# Patient Record
Sex: Female | Born: 1980 | Hispanic: Yes | Marital: Married | Smoking: Never smoker
Health system: Southern US, Community
[De-identification: ages and names within clinical notes are randomized; demographics above are authoritative.]

## PROBLEM LIST (undated history)

## (undated) ENCOUNTER — Inpatient Hospital Stay (HOSPITAL_COMMUNITY): Payer: Self-pay

## (undated) DIAGNOSIS — T40601A Poisoning by unspecified narcotics, accidental (unintentional), initial encounter: Secondary | ICD-10-CM

## (undated) DIAGNOSIS — Z9151 Personal history of suicidal behavior: Secondary | ICD-10-CM

## (undated) DIAGNOSIS — T50902A Poisoning by unspecified drugs, medicaments and biological substances, intentional self-harm, initial encounter: Secondary | ICD-10-CM

## (undated) DIAGNOSIS — M545 Low back pain, unspecified: Secondary | ICD-10-CM

## (undated) DIAGNOSIS — Z915 Personal history of self-harm: Secondary | ICD-10-CM

## (undated) DIAGNOSIS — R87619 Unspecified abnormal cytological findings in specimens from cervix uteri: Secondary | ICD-10-CM

## (undated) DIAGNOSIS — F4310 Post-traumatic stress disorder, unspecified: Secondary | ICD-10-CM

## (undated) DIAGNOSIS — T391X1A Poisoning by 4-Aminophenol derivatives, accidental (unintentional), initial encounter: Secondary | ICD-10-CM

## (undated) DIAGNOSIS — O021 Missed abortion: Secondary | ICD-10-CM

## (undated) DIAGNOSIS — IMO0002 Reserved for concepts with insufficient information to code with codable children: Secondary | ICD-10-CM

## (undated) HISTORY — DX: Low back pain: M54.5

## (undated) HISTORY — DX: Poisoning by unspecified narcotics, accidental (unintentional), initial encounter: T40.601A

## (undated) HISTORY — DX: Post-traumatic stress disorder, unspecified: F43.10

## (undated) HISTORY — PX: DILATION AND CURETTAGE OF UTERUS: SHX78

## (undated) HISTORY — DX: Reserved for concepts with insufficient information to code with codable children: IMO0002

## (undated) HISTORY — DX: Poisoning by unspecified drugs, medicaments and biological substances, intentional self-harm, initial encounter: T50.902A

## (undated) HISTORY — DX: Low back pain, unspecified: M54.50

## (undated) HISTORY — DX: Poisoning by 4-aminophenol derivatives, accidental (unintentional), initial encounter: T39.1X1A

---
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
## 2010-07-10 DIAGNOSIS — IMO0002 Reserved for concepts with insufficient information to code with codable children: Secondary | ICD-10-CM

## 2010-07-10 HISTORY — DX: Reserved for concepts with insufficient information to code with codable children: IMO0002

## 2010-09-19 ENCOUNTER — Inpatient Hospital Stay (INDEPENDENT_AMBULATORY_CARE_PROVIDER_SITE_OTHER)
Admission: RE | Admit: 2010-09-19 | Discharge: 2010-09-19 | Disposition: A | Discharge status: Home / Self Care | Payer: Self-pay | Source: Ambulatory Visit | Attending: Emergency Medicine | Admitting: Emergency Medicine

## 2010-09-19 ENCOUNTER — Encounter: Payer: Self-pay | Admitting: Emergency Medicine

## 2010-09-19 DIAGNOSIS — N390 Urinary tract infection, site not specified: Secondary | ICD-10-CM

## 2010-09-19 LAB — CONVERTED CEMR LAB
Blood in Urine, dipstick: NEGATIVE
Glucose, Urine, Semiquant: NEGATIVE
Ketones, urine, test strip: NEGATIVE
Specific Gravity, Urine: 1.015
WBC Urine, dipstick: NEGATIVE
pH: 5.5

## 2010-09-22 ENCOUNTER — Telehealth (INDEPENDENT_AMBULATORY_CARE_PROVIDER_SITE_OTHER): Payer: Self-pay | Admitting: *Deleted

## 2010-12-04 ENCOUNTER — Other Ambulatory Visit: Payer: Self-pay | Admitting: Family Medicine

## 2010-12-04 ENCOUNTER — Encounter: Payer: Self-pay | Admitting: Family Medicine

## 2010-12-04 ENCOUNTER — Inpatient Hospital Stay (INDEPENDENT_AMBULATORY_CARE_PROVIDER_SITE_OTHER)
Admission: RE | Admit: 2010-12-04 | Discharge: 2010-12-04 | Disposition: A | Discharge status: Home / Self Care | Payer: Self-pay | Source: Ambulatory Visit | Attending: Family Medicine | Admitting: Family Medicine

## 2010-12-04 ENCOUNTER — Ambulatory Visit
Admission: RE | Admit: 2010-12-04 | Discharge: 2010-12-04 | Disposition: A | Discharge status: Home / Self Care | Payer: Self-pay | Source: Ambulatory Visit | Attending: Family Medicine | Admitting: Family Medicine

## 2010-12-04 DIAGNOSIS — R05 Cough: Secondary | ICD-10-CM

## 2010-12-04 DIAGNOSIS — R059 Cough, unspecified: Secondary | ICD-10-CM

## 2010-12-04 DIAGNOSIS — J069 Acute upper respiratory infection, unspecified: Secondary | ICD-10-CM

## 2010-12-04 DIAGNOSIS — M940 Chondrocostal junction syndrome [Tietze]: Secondary | ICD-10-CM

## 2011-01-18 ENCOUNTER — Emergency Department (INDEPENDENT_AMBULATORY_CARE_PROVIDER_SITE_OTHER)
Admission: EM | Admit: 2011-01-18 | Discharge: 2011-01-18 | Disposition: A | Discharge status: Home / Self Care | Payer: Self-pay | Source: Home / Self Care | Attending: Family Medicine | Admitting: Family Medicine

## 2011-01-18 ENCOUNTER — Encounter (HOSPITAL_COMMUNITY): Payer: Self-pay | Admitting: Family Medicine

## 2011-01-18 DIAGNOSIS — N949 Unspecified condition associated with female genital organs and menstrual cycle: Secondary | ICD-10-CM

## 2011-01-18 DIAGNOSIS — R102 Pelvic and perineal pain: Secondary | ICD-10-CM

## 2011-01-18 LAB — POCT URINALYSIS DIP (DEVICE)
Glucose, UA: NEGATIVE mg/dL
Nitrite: NEGATIVE
Urobilinogen, UA: 0.2 mg/dL (ref 0.0–1.0)
pH: 5.5 (ref 5.0–8.0)

## 2011-01-18 LAB — WET PREP, GENITAL: WBC, Wet Prep HPF POC: NONE SEEN

## 2011-01-18 NOTE — ED Notes (Signed)
C/o pain in her vagina; information per interpreter
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
## 2011-01-19 NOTE — ED Notes (Signed)
Pieter Partridge called ucc with concerns for patient's understanding of discharge instructions and follow up.  Called phone number to speak with patient at [phone].  No anwer.  [email or web address], rn

## 2011-01-21 ENCOUNTER — Inpatient Hospital Stay (HOSPITAL_COMMUNITY)
Admission: AD | Admit: 2011-01-21 | Discharge: 2011-01-21 | Disposition: A | Discharge status: Home / Self Care | Payer: No Typology Code available for payment source | Source: Ambulatory Visit | Attending: Obstetrics & Gynecology | Admitting: Obstetrics & Gynecology

## 2011-01-21 ENCOUNTER — Encounter (HOSPITAL_COMMUNITY): Payer: Self-pay | Admitting: *Deleted

## 2011-01-21 ENCOUNTER — Inpatient Hospital Stay (HOSPITAL_COMMUNITY): Payer: No Typology Code available for payment source

## 2011-01-21 ENCOUNTER — Telehealth (HOSPITAL_COMMUNITY): Payer: Self-pay | Admitting: *Deleted

## 2011-01-21 DIAGNOSIS — B9689 Other specified bacterial agents as the cause of diseases classified elsewhere: Secondary | ICD-10-CM | POA: Insufficient documentation

## 2011-01-21 DIAGNOSIS — A499 Bacterial infection, unspecified: Secondary | ICD-10-CM | POA: Insufficient documentation

## 2011-01-21 DIAGNOSIS — N760 Acute vaginitis: Secondary | ICD-10-CM | POA: Insufficient documentation

## 2011-01-21 DIAGNOSIS — R102 Pelvic and perineal pain unspecified side: Secondary | ICD-10-CM | POA: Diagnosis present

## 2011-01-21 DIAGNOSIS — R109 Unspecified abdominal pain: Secondary | ICD-10-CM | POA: Insufficient documentation

## 2011-01-21 DIAGNOSIS — N949 Unspecified condition associated with female genital organs and menstrual cycle: Secondary | ICD-10-CM

## 2011-01-21 HISTORY — DX: Reserved for concepts with insufficient information to code with codable children: IMO0002

## 2011-01-21 HISTORY — DX: Unspecified abnormal cytological findings in specimens from cervix uteri: R87.619

## 2011-01-21 LAB — URINALYSIS, ROUTINE W REFLEX MICROSCOPIC
Leukocytes, UA: NEGATIVE
Nitrite: NEGATIVE
Specific Gravity, Urine: >1.03 — ABNORMAL HIGH (ref 1.005–1.030)
Urobilinogen, UA: 0.2 mg/dL (ref 0.0–1.0)

## 2011-01-21 LAB — CBC
Platelets: 270 10*3/uL (ref 150–400)
RBC: 3.98 MIL/uL (ref 3.87–5.11)
WBC: 7.5 10*3/uL (ref 4.0–10.5)

## 2011-01-21 MED ORDER — DOXYCYCLINE HYCLATE 50 MG PO CAPS: 100.0000 mg | ORAL_CAPSULE | Freq: Two times a day (BID) | ORAL | Status: AC

## 2011-01-21 MED ORDER — CEFTRIAXONE SODIUM 250 MG IJ SOLR
250.0000 mg | Freq: Once | INTRAMUSCULAR | Status: AC
  Administered 2011-01-21: 250 mg via INTRAMUSCULAR
  Filled 2011-01-21: qty 250

## 2011-01-21 MED ORDER — CLINDAMYCIN HCL 300 MG PO CAPS: 300.0000 mg | ORAL_CAPSULE | Freq: Two times a day (BID) | ORAL | Status: AC

## 2011-01-21 NOTE — ED Notes (Signed)
No problems with injection. Pt dc'd at this time
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
## 2011-01-21 NOTE — Progress Notes (Signed)
Waiting for interpreter, pt advocate with her, limited skills to interpret,  Vaginal pain, no bleeding, LMP 10/28, seen at ED  11/9/ no meds, no cause of pain

## 2011-01-24 NOTE — ED Provider Notes (Signed)
Agree with above note.  [name] Kessen H. 01/24/2011 12:42 PM  

## 2011-02-11 NOTE — Telephone Encounter (Signed)
  Phone Note Outgoing Call   Call placed by: Lajean Saver RN,  September 22, 2010 1:59 PM Call placed to: Patient Summary of Call: message left informing of f/u call and to call facility with questions/concerns 09/24/2010- spoke to the pts case worker, Fleet Contras, she states that the pt called her and states that she is itching and has a rash. she is unsure if the rash is on her skin or if it is vaginal itching. Per Dr Orson Aloe I advised her if it is a skin reaction to stop the abt and take OTC Benadryl or Zyrtec. If it is vaginal itching continue abt and take Diflucan, rx called to [facility] [address] in [place].Reminded them to f/u with the [facility] clinic soon. pts case worker agrees. Initial call taken by: Clemens Catholic LPN,  September 24, 2010 6:59 PM

## 2011-02-11 NOTE — Progress Notes (Signed)
Summary: BURNING PAIN IN LEFT ARM/TRAVELING TO CHEST (rm 5)   Vital Signs:  Patient Profile:   30 Years Old Female CC:      burning in left shoulder and chest x 3 days, sore throat and coughx 2-3 weeks Height:     61 inches Weight:      120 pounds O2 Sat:      99 % O2 treatment:    Room Air Temp:     98.3 degrees F oral Pulse rate:   69 / minute Resp:     14 per minute BP sitting:   101 / 62  (left arm) Cuff size:   regular  Vitals Entered By: Lajean Saver RN (December 04, 2010 9:25 AM)                  Updated Prior Medication List: CIPROFLOXACIN HCL 500 MG TABS (CIPROFLOXACIN HCL) two times a day  Current Allergies: No known allergies History of Present Illness Chief Complaint: burning in left shoulder and chest x 3 days, sore throat and coughx 2-3 weeks History of Present Illness:  Subjective:  Patient presents with case worker/interpreter.  She complains of a cough for over a week with sore throat.  She has now developed pain in her anterior chest that radiates to her left shoulder, and has had a fever. She is presently taking Cipro for a UTI  REVIEW OF SYSTEMS Constitutional Symptoms       Complains of fever and chills.     Denies night sweats, weight loss, weight gain, and fatigue.  Eyes       Denies change in vision, eye pain, eye discharge, glasses, contact lenses, and eye surgery. Ear/Nose/Throat/Mouth       Complains of sore throat.      Denies hearing loss/aids, change in hearing, ear pain, ear discharge, dizziness, frequent runny nose, frequent nose bleeds, sinus problems, hoarseness, and tooth pain or bleeding.  Respiratory       Complains of dry cough.      Denies productive cough, wheezing, shortness of breath, asthma, bronchitis, and emphysema/COPD.  Cardiovascular       Complains of chest pain.      Denies murmurs and tires easily with exhertion.      Comments: burning   Gastrointestinal       Denies stomach pain, nausea/vomiting, diarrhea,  constipation, blood in bowel movements, and indigestion. Genitourniary       Denies painful urination, kidney stones, and loss of urinary control. Neurological       Denies paralysis, seizures, and fainting/blackouts. Musculoskeletal       Denies muscle pain, joint pain, joint stiffness, decreased range of motion, redness, swelling, muscle weakness, and gout.  Skin       Denies bruising, unusual mles/lumps or sores, and hair/skin or nail changes.  Psych       Denies mood changes, temper/anger issues, anxiety/stress, speech problems, depression, and sleep problems. Other Comments: Current complaints gathered via case worker, patients does not speak any [place]. Patient c/o cough and sore throat x 2-3 weeks. C/o some chills and ?fever. She also c/o burning pain in left shoulder that has moved in to chest x 3 days   Past History:  Past Medical History: UNKNOWN recent UTIs  Past Surgical History: Reviewed history from 09/19/2010 and no changes required. Denies surgical history  Family History: none/unknown  Social History: Never Smoked Alcohol use-no Drug use-no No English   Objective:  Appearance:  Patient appears healthy,  stated age, and in no acute distress  Eyes:  Pupils are equal, round, and reactive to light and accomodation.  Extraocular movement is intact.  Conjunctivae are not inflamed.  Ears:  Canals normal.  Tympanic membranes normal.   Nose:  Mildly congested turbinates.  No sinus tenderness  Pharynx:  Normal  Neck:  Supple.  Tender shotty osterior nodes are palpated bilaterally.   Lungs:  Clear to auscultation.  Breath sounds are equal.  Chest:  Distinct tenderness over sternum.  Palpation there recreates her chest pain Heart:  Regular rate and rhythm without murmurs, rubs, or gallops.  Abdomen:  Nontender without masses or hepatosplenomegaly.  Bowel sounds are present.  No CVA or flank tenderness.  Rapid strep test negative Chest X-ray negative CBC:  WBC 6.4 ;  LY 36.9, MO 4.2, GR 58.9; Hgb 12.2  Assessment New Problems: UPPER RESPIRATORY INFECTION, ACUTE (ICD-465.9) COSTOCHONDRITIS, ACUTE (ICD-733.6) COUGH (ICD-786.2)  VIRAL URI WITH COSTOCHONDRITIS  Plan New Medications/Changes: BENZONATATE 200 MG CAPS (BENZONATATE) One by mouth hs as needed cough  #12 x 0, 12/04/2010, Donna Christen MD NAPROXEN 375 MG TABS (NAPROXEN) 1 by mouth q12hr pc  #20 x 0, 12/04/2010, Donna Christen MD  New Orders: T-Chest x-ray, 2 views [71020] Est. Patient Level II [09604] Planning Comments:   Discussed with patient and case worker; reassurance.  Given a [facility] patient information and instruction sheets in Spanish on topics Costochondritis and Viral URI. Begin naproxen, expectorant/decongestant, cough suppressant at bedtime.  Increase fluid intake Followup with PCP if not improving 7 to 10 days   The patient and/or caregiver has been counseled thoroughly with regard to medications prescribed including dosage, schedule, interactions, rationale for use, and possible side effects and they verbalize understanding.  Diagnoses and expected course of recovery discussed and will return if not improved as expected or if the condition worsens. Patient and/or caregiver verbalized understanding.  Prescriptions: BENZONATATE 200 MG CAPS (BENZONATATE) One by mouth hs as needed cough  #12 x 0   Entered and Authorized by:   Donna Christen MD   Signed by:   Donna Christen MD on 12/04/2010   Method used:   Print then Give to Patient   RxID:   [id number] NAPROXEN 375 MG TABS (NAPROXEN) 1 by mouth q12hr pc  #20 x 0   Entered and Authorized by:   Donna Christen MD   Signed by:   Donna Christen MD on 12/04/2010   Method used:   Print then Give to Patient   RxID:   [id number]   Patient Instructions: 1)  Take Mucinex D (guaifenesin with decongestant) twice daily for congestion. 2)  Increase fluid intake, rest. 3)  Followup with family doctor if not improving 7 to 10 days.     Orders Added: 1)  T-Chest x-ray, 2 views [71020] 2)  Est. Patient Level II [28413]    Laboratory Results  Date/Time Received: December 04, 2010 9:30 AM  Date/Time Reported: December 04, 2010 9:30 AM   Other Tests  Rapid Strep: negative  Kit Test Internal QC: Negative   (Normal Range: Negative)
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
## 2011-02-11 NOTE — Letter (Signed)
Summary: Out of [facility]  [address]   [place], [place] [address]   Phone: [phone]  Fax: [phone]    December 04, 2010   Student:  Delmar Landau    To Whom It May Concern:   For Medical reasons, please excuse the above named student from school 12/03/10 through 12/05/10.   If you need additional information, please feel free to contact our office.   Sincerely,    Donna Christen MD    ****This is a legal document and cannot be tampered with.  Schools are authorized to verify all information and to do so accordingly.

## 2011-02-20 ENCOUNTER — Encounter: Payer: Self-pay | Admitting: Advanced Practice Midwife

## 2011-02-20 ENCOUNTER — Ambulatory Visit (INDEPENDENT_AMBULATORY_CARE_PROVIDER_SITE_OTHER): Payer: Self-pay | Admitting: Advanced Practice Midwife

## 2011-02-20 VITALS — BP 91/54 | HR 72 | Temp 96.7°F | Ht <= 58 in | Wt 118.9 lb

## 2011-02-20 DIAGNOSIS — G4700 Insomnia, unspecified: Secondary | ICD-10-CM

## 2011-02-20 DIAGNOSIS — F4310 Post-traumatic stress disorder, unspecified: Secondary | ICD-10-CM

## 2011-02-20 DIAGNOSIS — N760 Acute vaginitis: Secondary | ICD-10-CM

## 2011-02-20 HISTORY — DX: Post-traumatic stress disorder, unspecified: F43.10

## 2011-02-20 MED ORDER — ZOLPIDEM TARTRATE 10 MG PO TABS: 10.0000 mg | ORAL_TABLET | Freq: Every evening | ORAL | Status: AC | PRN
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
## 2011-02-20 NOTE — Patient Instructions (Signed)
  Trastorno por estrs post traumtico (Post-Traumatic Stress Disorder) Si usted ha recibido el diagnstico de trastorno por estrs post traumtico (TEPT), probablemente ha experimentado un acontecimiento traumtico en su vida. Estos hechos generalmente estn fuera del rango de las experiencias humanas normales e impactan de manera negativa en toda persona normal.  CAUSAS El diagnstico se realiza en personas que han vivido o visto situaciones de riesgo como:  Accidentes automovilsticos.   Dolores Frame.   Desastres naturales.   Violaciones.   Violencia familiar.   Todo hecho en el que haya sido amenazada su vida.  Este trastorno es una verdadera enfermedad. Puede ocurrir a Emergency planning/management officer de toda edad. Tambin le sucede a los nios. Un mdico o un profesional de Radiographer, therapeutic mental con experiencia en este trastorno puede ayudarlo. SNTOMAS Puede ser que no presente todos los sntomas.  Sueos estresantes.   Reiteracin del hecho: sentir que el hecho atemorizante va a ocurrir nuevamente.   Evitar actividades, lugares y Theme park manager.   Esfuerzos para evitar pensamientos y sentimientos asociados al mismo.   Tener pensamientos atemorizantes que no pueden controlarse.   Sentirse nervioso, con aumento del [place] y vigilancia.   Problemas para dormir.   Sentirse solo, sensacin de [employer] dems son indiferentes.   Explosiones de ira.   Sentirse preocupado, culpable o triste.   Pensamientos relacionados con hacerse dao a s mismo o a los dems.  Este trastorno puede comenzar enseguida de ocurrido una situacin de riesgo, o meses o aos despus. Muchos veteranos de guerra lo sufren. El consumo de alcohol o drogas no ayudar a Therapist, music, y puede empeorarlo an ms.  TRATAMIENTO Este trastorno puede tratarse. El [address] en psicoterapia, medicamentos o [place]. Un mdico o un profesional de Radiographer, therapeutic mental con experiencia en este  trastorno puede ayudarlo. El diagnstico y tratamiento tempranos son lo ms indicado y Fish farm manager una mejora rpida. Busque ayuda si un ser querido tiene pensamientos relacionados con hacerse dao a s mismo. Comunquese con el servicio de urgencias de su localidad si necesita ayuda inmediatamente. Document Released: 12/05/2004 Document Revised: 11/07/2010 [facility] Patient Information 2012 [place], [place].Vulvovaginitis Candidisica (Candidal Vulvovaginitis) La vulvovaginitis candidisica es una infeccin de la vagina y la vulva. La vulva es la piel que rodea la abertura de la vagina. Puede causar picazn y [place] de y alrededor de la vagina.  CUIDADOS EN EL HOGAR  Slo tome medicamentos como lo indique su mdico.   No mantenga relaciones sexuales hasta que la infeccin haya curado o segn le indique el mdico.   Practique sexo seguro.   Informe a su compaero sexual acerca de su infeccin.   No tome duchas vaginales ni use tampones.   Use ropa interior de algodn. No utilice pantalones ni pantimedias ajustados.   Coma yogur. Esto puede ayudar a tratar y prevenir las infecciones por cndida.  SOLICITE AYUDA DE INMEDIATO SI:   Tiene fiebre.   Los problemas empeoran durante el tratamiento, o si no mejora luego de [address].   Tiene malestar, irritacin, o picazn en la zona de la vagina o la vulva.   Siente dolor en al [employer]   Comienza a sentir dolor abdominal.  ASEGRESE DE QUE:  Comprende estas instrucciones.   Controlar su enfermedad.   Solicitar ayuda de inmediato si no mejora o empeora.  Document Released: 03/30/2010 Document Revised: 11/07/2010 [facility] Patient Information 2012 [place], [place].

## 2011-02-21 LAB — WET PREP, GENITAL
Trich, Wet Prep: NONE SEEN
Yeast Wet Prep HPF POC: NONE SEEN

## 2011-02-21 NOTE — Progress Notes (Signed)
Pt left clinic without Rx for Ambien.  Rx called in to [facility].

## 2011-04-23 ENCOUNTER — Other Ambulatory Visit: Payer: Self-pay | Admitting: Advanced Practice Midwife

## 2011-09-04 ENCOUNTER — Ambulatory Visit: Payer: No Typology Code available for payment source | Admitting: Advanced Practice Midwife

## 2011-09-18 ENCOUNTER — Encounter (HOSPITAL_COMMUNITY): Payer: Self-pay | Admitting: Internal Medicine

## 2011-09-18 ENCOUNTER — Other Ambulatory Visit: Payer: Self-pay

## 2011-09-18 ENCOUNTER — Inpatient Hospital Stay (HOSPITAL_COMMUNITY): Payer: Medicaid Other

## 2011-09-18 ENCOUNTER — Inpatient Hospital Stay (HOSPITAL_COMMUNITY)
Admission: EM | Admit: 2011-09-18 | Discharge: 2011-09-20 | DRG: 781 | Disposition: A | Discharge status: Home / Self Care | Payer: Medicaid Other | Attending: Internal Medicine | Admitting: Internal Medicine

## 2011-09-18 DIAGNOSIS — O99891 Other specified diseases and conditions complicating pregnancy: Principal | ICD-10-CM | POA: Diagnosis present

## 2011-09-18 DIAGNOSIS — F17200 Nicotine dependence, unspecified, uncomplicated: Secondary | ICD-10-CM | POA: Diagnosis present

## 2011-09-18 DIAGNOSIS — Z331 Pregnant state, incidental: Secondary | ICD-10-CM

## 2011-09-18 DIAGNOSIS — T50902A Poisoning by unspecified drugs, medicaments and biological substances, intentional self-harm, initial encounter: Secondary | ICD-10-CM | POA: Diagnosis present

## 2011-09-18 DIAGNOSIS — R5383 Other fatigue: Secondary | ICD-10-CM | POA: Diagnosis present

## 2011-09-18 DIAGNOSIS — T1491XA Suicide attempt, initial encounter: Secondary | ICD-10-CM

## 2011-09-18 DIAGNOSIS — E876 Hypokalemia: Secondary | ICD-10-CM | POA: Diagnosis present

## 2011-09-18 DIAGNOSIS — T40601A Poisoning by unspecified narcotics, accidental (unintentional), initial encounter: Secondary | ICD-10-CM

## 2011-09-18 DIAGNOSIS — T50901A Poisoning by unspecified drugs, medicaments and biological substances, accidental (unintentional), initial encounter: Secondary | ICD-10-CM

## 2011-09-18 DIAGNOSIS — R5381 Other malaise: Secondary | ICD-10-CM | POA: Diagnosis present

## 2011-09-18 DIAGNOSIS — F4310 Post-traumatic stress disorder, unspecified: Secondary | ICD-10-CM | POA: Diagnosis present

## 2011-09-18 DIAGNOSIS — T391X1A Poisoning by 4-Aminophenol derivatives, accidental (unintentional), initial encounter: Secondary | ICD-10-CM

## 2011-09-18 DIAGNOSIS — T394X1A Poisoning by antirheumatics, not elsewhere classified, accidental (unintentional), initial encounter: Secondary | ICD-10-CM

## 2011-09-18 DIAGNOSIS — Z3490 Encounter for supervision of normal pregnancy, unspecified, unspecified trimester: Secondary | ICD-10-CM

## 2011-09-18 DIAGNOSIS — T394X2A Poisoning by antirheumatics, not elsewhere classified, intentional self-harm, initial encounter: Secondary | ICD-10-CM | POA: Diagnosis present

## 2011-09-18 DIAGNOSIS — T50992A Poisoning by other drugs, medicaments and biological substances, intentional self-harm, initial encounter: Secondary | ICD-10-CM

## 2011-09-18 DIAGNOSIS — E871 Hypo-osmolality and hyponatremia: Secondary | ICD-10-CM | POA: Diagnosis present

## 2011-09-18 HISTORY — DX: Poisoning by unspecified narcotics, accidental (unintentional), initial encounter: T40.601A

## 2011-09-18 HISTORY — DX: Poisoning by unspecified drugs, medicaments and biological substances, intentional self-harm, initial encounter: T50.902A

## 2011-09-18 HISTORY — DX: Post-traumatic stress disorder, unspecified: F43.10

## 2011-09-18 HISTORY — DX: Poisoning by 4-aminophenol derivatives, accidental (unintentional), initial encounter: T39.1X1A

## 2011-09-18 HISTORY — DX: Personal history of self-harm: Z91.5

## 2011-09-18 HISTORY — DX: Personal history of suicidal behavior: Z91.51

## 2011-09-18 LAB — MRSA PCR SCREENING: MRSA by PCR: NEGATIVE

## 2011-09-18 LAB — RAPID URINE DRUG SCREEN, HOSP PERFORMED
Cocaine: NOT DETECTED
Opiates: POSITIVE — AB

## 2011-09-18 LAB — COMPREHENSIVE METABOLIC PANEL
AST: 19 U/L (ref 0–37)
CO2: 17 mEq/L — ABNORMAL LOW (ref 19–32)
Calcium: 9 mg/dL (ref 8.4–10.5)
Creatinine, Ser: 0.45 mg/dL — ABNORMAL LOW (ref 0.50–1.10)
GFR calc Af Amer: >90 mL/min (ref >90)
GFR calc non Af Amer: >90 mL/min (ref >90)

## 2011-09-18 LAB — URINALYSIS, ROUTINE W REFLEX MICROSCOPIC
Bilirubin Urine: NEGATIVE
Glucose, UA: NEGATIVE mg/dL
Hgb urine dipstick: NEGATIVE
Specific Gravity, Urine: 1.013 (ref 1.005–1.030)
pH: 6.5 (ref 5.0–8.0)

## 2011-09-18 LAB — SALICYLATE LEVEL: Salicylate Lvl: <2 mg/dL — ABNORMAL LOW (ref 2.8–20.0)

## 2011-09-18 LAB — CBC
MCH: 30.2 pg (ref 26.0–34.0)
MCV: 85.4 fL (ref 78.0–100.0)
Platelets: 296 10*3/uL (ref 150–400)
RBC: 3.98 MIL/uL (ref 3.87–5.11)

## 2011-09-18 LAB — ETHANOL: Alcohol, Ethyl (B): <11 mg/dL (ref 0–11)

## 2011-09-18 LAB — ACETAMINOPHEN LEVEL: Acetaminophen (Tylenol), Serum: 21 ug/mL (ref 10–30)

## 2011-09-18 LAB — PREGNANCY, URINE: Preg Test, Ur: POSITIVE — AB

## 2011-09-18 MED ORDER — KCL IN DEXTROSE-NACL 20-5-0.9 MEQ/L-%-% IV SOLN
INTRAVENOUS | Status: DC
  Administered 2011-09-18 (×2): via INTRAVENOUS
  Administered 2011-09-19: 125 mL/h via INTRAVENOUS
  Filled 2011-09-18 (×5): qty 1000

## 2011-09-18 MED ORDER — SODIUM CHLORIDE 0.9 % IJ SOLN
3.0000 mL | Freq: Two times a day (BID) | INTRAMUSCULAR | Status: DC
  Administered 2011-09-18: 3 mL via INTRAVENOUS

## 2011-09-18 MED ORDER — ONDANSETRON HCL 4 MG/2ML IJ SOLN
4.0000 mg | Freq: Four times a day (QID) | INTRAMUSCULAR | Status: DC | PRN
  Administered 2011-09-18: 4 mg via INTRAVENOUS
  Filled 2011-09-18: qty 2

## 2011-09-18 MED ORDER — FOLIC ACID 1 MG PO TABS
1.0000 mg | ORAL_TABLET | Freq: Every day | ORAL | Status: DC
  Administered 2011-09-19 – 2011-09-20 (×2): 1 mg via ORAL
  Filled 2011-09-18 (×3): qty 1

## 2011-09-18 MED ORDER — GI COCKTAIL ~~LOC~~
30.0000 mL | Freq: Once | ORAL | Status: DC
  Filled 2011-09-18: qty 30

## 2011-09-18 MED ORDER — SODIUM CHLORIDE 0.9 % IV SOLN
INTRAVENOUS | Status: AC
  Administered 2011-09-18: 08:00:00 via INTRAVENOUS

## 2011-09-18 MED ORDER — ONDANSETRON HCL 4 MG PO TABS: 4.0000 mg | ORAL_TABLET | Freq: Four times a day (QID) | ORAL | Status: DC | PRN

## 2011-09-18 MED ORDER — NALOXONE HCL 0.4 MG/ML IJ SOLN
0.1000 ug/kg/h | INTRAVENOUS | Status: DC
  Filled 2011-09-18: qty 2.5

## 2011-09-18 MED ORDER — ALUM & MAG HYDROXIDE-SIMETH 200-200-20 MG/5ML PO SUSP
30.0000 mL | Freq: Once | ORAL | Status: DC
  Filled 2011-09-18: qty 30

## 2011-09-18 MED ORDER — SODIUM CHLORIDE 0.9 % IV SOLN
2.5000 ug/kg/h | INTRAVENOUS | Status: DC
  Administered 2011-09-18: 2.5 ug/kg/h via INTRAVENOUS
  Filled 2011-09-18: qty 4

## 2011-09-18 MED ORDER — PRENATAL MULTIVITAMIN CH
1.0000 | ORAL_TABLET | Freq: Every day | ORAL | Status: DC
  Administered 2011-09-18 – 2011-09-20 (×3): 1 via ORAL
  Filled 2011-09-18 (×4): qty 1

## 2011-09-18 NOTE — ED Notes (Signed)
Pt Spanish speaking, language barrier/ interpreter needs met

## 2011-09-18 NOTE — Progress Notes (Signed)
With assistance of interpreter, offered support. 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 Requested prayer.  Prayed for patient. Brought her a prayer shawl and explained it to her. Brought her a blessing pillow with butterflies on it. Utilized the butterfly as a symbol of hope and new beginning. Will follow up.  09/18/11 1300  Clinical Encounter Type  Visited With Patient  Visit Type Initial  Referral From Nurse  Recommendations Follow up  Spiritual Encounters  Spiritual Needs Emotional;Prayer  Stress Factors  Patient Stress Factors Other (Comment);Loss (Trauma)

## 2011-09-18 NOTE — Progress Notes (Signed)
Chart review.  Noted that Pt requires an interpreter and that ICU CSW has arranged to have an interpreter for hospital staff to use when communicating with Pt in the morning.  Psych CSW attempted to meet with Pt to discuss current admission.  Via phone interpreters, Pt voiced that she prefers to meet with CSW in the morning when an interpreter will be present in her room.  Psych CSW honored Pt's request and will meet with Pt in the morning when an interpreter is present.  CSW to continue to follow.  Providence Crosby, LCSWA Clinical Social Work [phone]

## 2011-09-18 NOTE — ED Notes (Signed)
Hospitalists in to see patient.

## 2011-09-18 NOTE — ED Notes (Signed)
Per EMS report, pt from: called 911 secondary OD of Norco and possibly amoxicillin. Also possibly other meds taken.  Advised by father of her baby to have an abortion so she took her medications in order to try to kill herself. Stable enroute. Vomitted about of clear liquids with some white particles.  Gave 2mg  of Narcan with some improvement in her LOC.
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
## 2011-09-18 NOTE — Progress Notes (Signed)
Pt is Spanish is speaking.  An interpreter is planning to arrive in am.  Psych MD and Psych CSW will meet with pt and interpreter in am.  Ivin Rosenbloom J. Ferol Luz, MD Psychiatrist . 09/18/2011 10:02 PM

## 2011-09-18 NOTE — ED Notes (Signed)
Stepdown called for report x1
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
## 2011-09-19 DIAGNOSIS — T426X4A Poisoning by other antiepileptic and sedative-hypnotic drugs, undetermined, initial encounter: Secondary | ICD-10-CM

## 2011-09-19 DIAGNOSIS — T4271XA Poisoning by unspecified antiepileptic and sedative-hypnotic drugs, accidental (unintentional), initial encounter: Secondary | ICD-10-CM

## 2011-09-19 LAB — CBC
HCT: 31.3 % — ABNORMAL LOW (ref 36.0–46.0)
Hemoglobin: 10.7 g/dL — ABNORMAL LOW (ref 12.0–15.0)
MCH: 30.1 pg (ref 26.0–34.0)
MCHC: 34.2 g/dL (ref 30.0–36.0)
RBC: 3.55 MIL/uL — ABNORMAL LOW (ref 3.87–5.11)

## 2011-09-19 LAB — COMPREHENSIVE METABOLIC PANEL
ALT: 24 U/L (ref 0–35)
AST: 12 U/L (ref 0–37)
CO2: 22 mEq/L (ref 19–32)
Calcium: 8.4 mg/dL (ref 8.4–10.5)
Creatinine, Ser: 0.44 mg/dL — ABNORMAL LOW (ref 0.50–1.10)
GFR calc non Af Amer: >90 mL/min (ref >90)
Sodium: 132 mEq/L — ABNORMAL LOW (ref 135–145)
Total Protein: 5.6 g/dL — ABNORMAL LOW (ref 6.0–8.3)

## 2011-09-19 LAB — PROTIME-INR
INR: 1.18 (ref 0.00–1.49)
Prothrombin Time: 15.2 seconds (ref 11.6–15.2)

## 2011-09-19 NOTE — Progress Notes (Signed)
Met with patient first and brought her a Spanish New Testament per her request to nurse. Then met with social workers to discuss how we can support patient.  09/19/11 1200  Clinical Encounter Type  Visited With Patient;Health care provider  Visit Type Follow-up  Consult/Referral To Social work  Recommendations Follow up  Spiritual Encounters  Spiritual Needs Emotional;Prayer

## 2011-09-19 NOTE — Progress Notes (Signed)
[facility] PROGRESS NOTE  [name] Hale [name] Hale:[id number] DOB: 1980/04/30 DOA: 09/18/2011 PCP: No primary provider on file.  Assessment/Plan: Principal Problem:  *Overdose of opiate or related narcotic Active Problems:  Post traumatic stress disorder  Overdose by acetaminophen  Lethargy  Intrauterine pregnancy  Suicide attempt by drug ingestion   Narcotic overdose -Ingestion of hydrocodone/acetaminophen-containing pills. About 12 pills of Vicodin 5/325. -Patient was placed on Narcan drip, secondary to extreme lethargy and she was on a step down unit. -This is improved,  the lethargy resolved. Patient off of the Narcan drip for at least more than 12 hours.  Acetaminophen overdose -Time of ingestion about 2 to 3 in the Am. -Acetaminophen level was 76.4 at 3:55 AM, 21.0 at 6:55 AM, according to acetaminophen toxicity normogram this does not require treatment. -LFTs and INR are normal this morning.  Suicide attempt by drug ingestion -Psychiatry was consulted, waiting for their recommendation for disposition. -Patient is medically stable for discharge.  Hypokalemia and hyponatremia -Likely secondary to poor oral intake and dehydration. -This is resolved with IV fluid hydration, discontinue IV fluids.  Intrauterine pregnancy -Ultrasound showed single living intrauterine pregnancy, 7 weeks and 1 day old as 09/18/2011 -Expected date of delivery is 05/05/2012  Code Status: Full Family Communication: No family members Disposition Plan: To regular bed, waiting for psychiatry recommendation on disposition.  Brief narrative: 31 year old [place] female came to the hospital after ingestion of about 12 Percocets, she was presented with extreme lethargy which responded to Narcan.  Consultants:  Psychiatry  Procedures:  None  Antibiotics:  None  HPI/Subjective: Feels much better, no complaints.  Objective: Filed Vitals:   09/19/11 0600 09/19/11 0700 09/19/11  0800 09/19/11 0940  BP: 88/44 88/49 86/50  96/62  Pulse: 62 60 60 72  Temp:    98.4 F (36.9 C)  TempSrc:    Oral  Resp: 11 13 14 18   Weight:    55 kg (121 lb 4.1 oz)  SpO2: 99% 99% 99% 99%    Intake/Output Summary (Last 24 hours) at 09/19/11 1132 Last data filed at 09/19/11 0800  Gross per 24 hour  Intake   2625 ml  Output   4900 ml  Net  -2275 ml    Exam:  General: Alert and awake, oriented x3, not in any acute distress. HEENT: anicteric sclera, pupils reactive to light and accommodation, EOMI CVS: S1-S2 clear, no murmur rubs or gallops Chest: clear to auscultation bilaterally, no wheezing, rales or rhonchi Abdomen: soft nontender, nondistended, normal bowel sounds, no organomegaly Extremities: no cyanosis, clubbing or edema noted bilaterally Neuro: Cranial nerves II-XII intact, no focal neurological deficits  Data Reviewed: Basic Metabolic Panel:  Lab 09/19/11 8119 09/18/11 0355  NA 132* 129*  K 3.7 3.3*  CL 103 96  CO2 22 17*  GLUCOSE 110* 110*  BUN <3* 5*  CREATININE 0.44* 0.45*  CALCIUM 8.4 9.0  MG -- --  PHOS -- --   Liver Function Tests:  Lab 09/19/11 0320 09/18/11 0355  AST 12 19  ALT 24 38*  ALKPHOS 41 54  BILITOT 0.2* 0.2*  PROT 5.6* 7.1  ALBUMIN 2.7* 3.6   No results found for this basename: LIPASE:5,AMYLASE:5 in the last 168 hours No results found for this basename: AMMONIA:5 in the last 168 hours CBC:  Lab 09/19/11 0320 09/18/11 0355  WBC 7.8 10.5  NEUTROABS -- --  HGB 10.7* 12.0  HCT 31.3* 34.0*  MCV 88.2 85.4  PLT 263 296   Cardiac Enzymes:  No results found for this basename: CKTOTAL:5,CKMB:5,CKMBINDEX:5,TROPONINI:5 in the last 168 hours BNP (last 3 results) No results found for this basename: PROBNP:3 in the last 8760 hours CBG: No results found for this basename: GLUCAP:5 in the last 168 hours  Recent Results (from the past 240 hour(s))  MRSA PCR SCREENING     Status: Normal   Collection Time   09-26-11  2:24 PM       Component Value Range Status Comment   MRSA by PCR NEGATIVE  NEGATIVE Final      Studies: US Ob Comp Less 14 Wks  2011/09/26  *RADIOLOGY REPORT*  Clinical Data: Drug overdose.  Early pregnancy.  Evaluate dating and viability.  OBSTETRIC <14 WK [place] AND TRANSVAGINAL OB US  Technique:  Both transabdominal and transvaginal ultrasound examinations were performed for complete evaluation of the gestation as well as the maternal uterus, adnexal regions, and pelvic cul-de-sac.  Transvaginal technique was performed to assess early pregnancy.  Comparison:  None.  Intrauterine gestational sac:  Visualized/normal in shape. Yolk sac: Visualized Embryo: Visualized Cardiac Activity: Visualized Heart Rate: 131 bpm  CRL: 15   mm  7   w  1   d          [place] EDC: 05/05/2012  Maternal uterus/adnexae: No fibroids identified.  Both ovaries are normal appearance.  Small left ovarian corpus luteum noted.  No adnexal mass or free fluid identified.  IMPRESSION:  1.  Single living IUP measuring 7 weeks 1 day with [place] EDC of 05/05/2012. 2.  No maternal uterine or adnexal abnormality identified.  Original Report Authenticated By: Danae Orleans, M.D.   US Ob Transvaginal  09-26-11  *RADIOLOGY REPORT*  Clinical Data: Drug overdose.  Early pregnancy.  Evaluate dating and viability.  OBSTETRIC <14 WK [place] AND TRANSVAGINAL OB US  Technique:  Both transabdominal and transvaginal ultrasound examinations were performed for complete evaluation of the gestation as well as the maternal uterus, adnexal regions, and pelvic cul-de-sac.  Transvaginal technique was performed to assess early pregnancy.  Comparison:  None.  Intrauterine gestational sac:  Visualized/normal in shape. Yolk sac: Visualized Embryo: Visualized Cardiac Activity: Visualized Heart Rate: 131 bpm  CRL: 15   mm  7   w  1   d          [place] EDC: 05/05/2012  Maternal uterus/adnexae: No fibroids identified.  Both ovaries are normal appearance.  Small left ovarian corpus luteum noted.  No  adnexal mass or free fluid identified.  IMPRESSION:  1.  Single living IUP measuring 7 weeks 1 day with [place] EDC of 05/05/2012. 2.  No maternal uterine or adnexal abnormality identified.  Original Report Authenticated By: Danae Orleans, M.D.    Scheduled Meds:   . sodium chloride   Intravenous STAT  . alum & mag hydroxide-simeth  30 mL Oral Once  . folic acid  1 mg Oral Daily  . prenatal multivitamin  1 tablet Oral Daily  . DISCONTD: gi cocktail  30 mL Oral Once  . DISCONTD: sodium chloride  3 mL Intravenous Q12H   Continuous Infusions:   . DISCONTD: dextrose 5 % and 0.9 % NaCl with KCl 20 mEq/L 125 mL/hr (09/19/11 0149)  . DISCONTD: naloxone Stopped (09-26-11 1723)  . DISCONTD: naloxone [facility]) Pediatric infusion for overdose 0.016 mg/mL 2.5 mcg/kg/hr (09/26/11 1400)     [facility] Pager [phone]  If 7PM-7AM, please contact night-coverage [email or web address] Password [email or web address] 09/19/2011, 11:32 AM   LOS: 1 day

## 2011-09-19 NOTE — Care Management Note (Unsigned)
    Page 1 of 1   09/19/2011     1:16:06 PM   CARE MANAGEMENT NOTE 09/19/2011  Patient:  [name] Hale, [name] Hale   Account Number:  [id number]  Date Initiated:  09/19/2011  Documentation initiated by:  Lanier Clam  Subjective/Objective Assessment:   ADMITTED W/OD OPIATE,SUICIDE ATTEMPT.ZD:GUYQ,0 SUICIDE ATTEMPTS.     Action/Plan:   FROM HOME   Anticipated DC Date:  09/23/2011   Anticipated DC Plan:  PSYCHIATRIC HOSPITAL  In-house referral  Clinical Social Worker  Interpreting Services      DC Planning Services  CM consult      Choice offered to / List presented to:             Status of service:  In process, will continue to follow Medicare Important Message given?   (If response is "NO", the following Medicare IM given date fields will be blank) Date Medicare IM given:   Date Additional Medicare IM given:    Discharge Disposition:    Per UR Regulation:  Reviewed for med. necessity/level of care/duration of stay  If discussed at Long Length of Stay Meetings, dates discussed:    Comments:  09/19/11 Trevell Pariseau RN,BSN NCM [phone] TRANSFERRED FROM ICU, TO MED FLOOR.PSYCH  MD/SW ALREADY FOLLOWING.SPANISH INTERPRETER SERVICES ALREADY IN PLACE.
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
## 2011-09-20 DIAGNOSIS — F4310 Post-traumatic stress disorder, unspecified: Secondary | ICD-10-CM

## 2011-09-20 DIAGNOSIS — T394X2A Poisoning by antirheumatics, not elsewhere classified, intentional self-harm, initial encounter: Secondary | ICD-10-CM

## 2011-09-20 DIAGNOSIS — F17200 Nicotine dependence, unspecified, uncomplicated: Secondary | ICD-10-CM

## 2011-09-20 MED ORDER — PRENATAL 27-0.8 MG PO TABS: 1.0000 | ORAL_TABLET | Freq: Every day | ORAL | Status: DC

## 2011-09-20 MED ORDER — FOLIC ACID 1 MG PO TABS: 1.0000 mg | ORAL_TABLET | Freq: Every day | ORAL | Status: AC

## 2011-09-20 NOTE — Progress Notes (Signed)
Spoke with Fleet Contras at [employer].  Fleet Contras provided CSW with Pt's outpt provider information: Georgena Spurling with [facility] [place].  CSW thanked Fleet Contras for her time.  LM for [employer] [facility] [place].  CSW to continue to follow.  Providence Crosby, LCSWA Clinical Social Work [phone]

## 2011-09-20 NOTE — Progress Notes (Signed)
Per MD, Pt ready for d/c.  Notified RN and Fleet Contras from [employer].  Fleet Contras states that she will arrive by noon to provide transportation for Pt.    Notified Fleet Contras that a message was left for Pt's counselor at [facility] [place].  Fleet Contras to f/u with counselor on Pt's behalf.  CSW arranged for an interpreter to assist RN with d/c.  Pt to be d/c'd.  Providence Crosby, LCSWA Clinical Social Work [phone]
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
## 2012-05-06 ENCOUNTER — Encounter (HOSPITAL_COMMUNITY): Payer: Self-pay | Admitting: *Deleted

## 2012-05-06 ENCOUNTER — Inpatient Hospital Stay (HOSPITAL_COMMUNITY)
Admission: AD | Admit: 2012-05-06 | Discharge: 2012-05-06 | Disposition: A | Discharge status: Home / Self Care | Payer: Medicaid Other | Source: Ambulatory Visit | Attending: Obstetrics | Admitting: Obstetrics

## 2012-05-06 DIAGNOSIS — O479 False labor, unspecified: Secondary | ICD-10-CM | POA: Insufficient documentation

## 2012-05-06 NOTE — MAU Note (Signed)
Dr. Clearance Coots notified of pt.  Orders for D/C rec'd.

## 2012-05-06 NOTE — MAU Note (Signed)
Per interpreter, pt reports she has been having contractions and some bleeding

## 2012-05-07 ENCOUNTER — Inpatient Hospital Stay (HOSPITAL_COMMUNITY)
Admission: AD | Admit: 2012-05-07 | Discharge: 2012-05-12 | DRG: 765 | Disposition: A | Discharge status: Home / Self Care | Payer: Medicaid Other | Source: Ambulatory Visit | Attending: Obstetrics & Gynecology | Admitting: Obstetrics & Gynecology

## 2012-05-07 ENCOUNTER — Encounter (HOSPITAL_COMMUNITY): Payer: Self-pay

## 2012-05-07 ENCOUNTER — Inpatient Hospital Stay (HOSPITAL_COMMUNITY): Payer: Medicaid Other

## 2012-05-07 DIAGNOSIS — O9903 Anemia complicating the puerperium: Secondary | ICD-10-CM | POA: Diagnosis not present

## 2012-05-07 DIAGNOSIS — O4290 Premature rupture of membranes, unspecified as to length of time between rupture and onset of labor, unspecified weeks of gestation: Secondary | ICD-10-CM | POA: Diagnosis present

## 2012-05-07 DIAGNOSIS — D62 Acute posthemorrhagic anemia: Secondary | ICD-10-CM | POA: Diagnosis not present

## 2012-05-07 LAB — CBC
HCT: 36.2 % (ref 36.0–46.0)
MCH: 32.1 pg (ref 26.0–34.0)
MCV: 91.4 fL (ref 78.0–100.0)
Platelets: 217 10*3/uL (ref 150–400)
RDW: 13.4 % (ref 11.5–15.5)

## 2012-05-07 LAB — OB RESULTS CONSOLE ABO/RH: "RH Type ": POSITIVE

## 2012-05-07 LAB — OB RESULTS CONSOLE GC/CHLAMYDIA
Chlamydia: NEGATIVE
Gonorrhea: NEGATIVE

## 2012-05-07 LAB — ABO/RH: ABO/RH(D): O POS

## 2012-05-07 LAB — TYPE AND SCREEN
ABO/RH(D): O POS
Antibody Screen: NEGATIVE

## 2012-05-07 LAB — OB RESULTS CONSOLE HEPATITIS B SURFACE ANTIGEN: Hepatitis B Surface Ag: NEGATIVE

## 2012-05-07 LAB — OB RESULTS CONSOLE RUBELLA ANTIBODY, IGM: Rubella: IMMUNE

## 2012-05-07 LAB — OB RESULTS CONSOLE GBS
GBS: POSITIVE
GBS: NEGATIVE

## 2012-05-07 LAB — OB RESULTS CONSOLE RPR: RPR: NONREACTIVE

## 2012-05-07 MED ORDER — FLEET ENEMA 7-19 GM/118ML RE ENEM: 1.0000 | ENEMA | RECTAL | Status: DC | PRN

## 2012-05-07 MED ORDER — PHENYLEPHRINE 40 MCG/ML (10ML) SYRINGE FOR IV PUSH (FOR BLOOD PRESSURE SUPPORT)
80.0000 ug | PREFILLED_SYRINGE | INTRAVENOUS | Status: DC | PRN
  Filled 2012-05-07: qty 5

## 2012-05-07 MED ORDER — OXYTOCIN 40 UNITS IN LACTATED RINGERS INFUSION - SIMPLE MED
62.5000 mL/h | INTRAVENOUS | Status: DC
  Filled 2012-05-07: qty 1000

## 2012-05-07 MED ORDER — OXYTOCIN 40 UNITS IN LACTATED RINGERS INFUSION - SIMPLE MED
1.0000 m[IU]/min | INTRAVENOUS | Status: DC
  Administered 2012-05-07: 1 m[IU]/min via INTRAVENOUS

## 2012-05-07 MED ORDER — FENTANYL 2.5 MCG/ML BUPIVACAINE 1/10 % EPIDURAL INFUSION (WH - ANES)
14.0000 mL/h | INTRAMUSCULAR | Status: DC
  Administered 2012-05-07: 14 mL/h via EPIDURAL
  Administered 2012-05-08: 12 mL/h via EPIDURAL
  Administered 2012-05-08 – 2012-05-09 (×2): 14 mL/h via EPIDURAL
  Filled 2012-05-07 (×5): qty 125

## 2012-05-07 MED ORDER — LIDOCAINE HCL (PF) 1 % IJ SOLN
INTRAMUSCULAR | Status: DC | PRN
  Administered 2012-05-07: 8 mL
  Administered 2012-05-07: 6 mL

## 2012-05-07 MED ORDER — PENICILLIN G POTASSIUM 5000000 UNITS IJ SOLR
5.0000 10*6.[IU] | Freq: Once | INTRAVENOUS | Status: DC
  Filled 2012-05-07: qty 5

## 2012-05-07 MED ORDER — OXYCODONE-ACETAMINOPHEN 5-325 MG PO TABS: 1.0000 | ORAL_TABLET | ORAL | Status: DC | PRN

## 2012-05-07 MED ORDER — OXYTOCIN BOLUS FROM INFUSION: 500.0000 mL | INTRAVENOUS | Status: DC

## 2012-05-07 MED ORDER — LACTATED RINGERS IV SOLN: 500.0000 mL | Freq: Once | INTRAVENOUS | Status: DC

## 2012-05-07 MED ORDER — LIDOCAINE HCL (PF) 1 % IJ SOLN: 30.0000 mL | INTRAMUSCULAR | Status: DC | PRN

## 2012-05-07 MED ORDER — CITRIC ACID-SODIUM CITRATE 334-500 MG/5ML PO SOLN
30.0000 mL | ORAL | Status: DC | PRN
  Administered 2012-05-09: 30 mL via ORAL
  Filled 2012-05-07: qty 15

## 2012-05-07 MED ORDER — ACETAMINOPHEN 325 MG PO TABS: 650.0000 mg | ORAL_TABLET | ORAL | Status: DC | PRN

## 2012-05-07 MED ORDER — DIPHENHYDRAMINE HCL 50 MG/ML IJ SOLN: 12.5000 mg | INTRAMUSCULAR | Status: DC | PRN

## 2012-05-07 MED ORDER — ONDANSETRON HCL 4 MG/2ML IJ SOLN: 4.0000 mg | Freq: Four times a day (QID) | INTRAMUSCULAR | Status: DC | PRN

## 2012-05-07 MED ORDER — EPHEDRINE 5 MG/ML INJ
10.0000 mg | INTRAVENOUS | Status: DC | PRN
  Filled 2012-05-07: qty 4

## 2012-05-07 MED ORDER — PHENYLEPHRINE 40 MCG/ML (10ML) SYRINGE FOR IV PUSH (FOR BLOOD PRESSURE SUPPORT): 80.0000 ug | PREFILLED_SYRINGE | INTRAVENOUS | Status: DC | PRN

## 2012-05-07 MED ORDER — LACTATED RINGERS IV SOLN
500.0000 mL | INTRAVENOUS | Status: DC | PRN
  Administered 2012-05-08: 500 mL via INTRAVENOUS

## 2012-05-07 MED ORDER — MISOPROSTOL 200 MCG PO TABS
ORAL_TABLET | ORAL | Status: AC
  Filled 2012-05-07: qty 4

## 2012-05-07 MED ORDER — PENICILLIN G POTASSIUM 5000000 UNITS IJ SOLR
2.5000 10*6.[IU] | INTRAVENOUS | Status: DC
  Filled 2012-05-07 (×4): qty 2.5

## 2012-05-07 MED ORDER — FENTANYL 2.5 MCG/ML BUPIVACAINE 1/10 % EPIDURAL INFUSION (WH - ANES)
INTRAMUSCULAR | Status: DC | PRN
  Administered 2012-05-07: 7 mL/h via EPIDURAL

## 2012-05-07 MED ORDER — EPHEDRINE 5 MG/ML INJ: 10.0000 mg | INTRAVENOUS | Status: DC | PRN

## 2012-05-07 MED ORDER — IBUPROFEN 600 MG PO TABS: 600.0000 mg | ORAL_TABLET | Freq: Four times a day (QID) | ORAL | Status: DC | PRN

## 2012-05-07 MED ORDER — TERBUTALINE SULFATE 1 MG/ML IJ SOLN: 0.2500 mg | Freq: Once | INTRAMUSCULAR | Status: AC | PRN

## 2012-05-07 MED ORDER — LACTATED RINGERS IV SOLN
INTRAVENOUS | Status: DC
  Administered 2012-05-07 (×4): via INTRAVENOUS
  Administered 2012-05-08 (×2): 125 mL/h via INTRAVENOUS
  Administered 2012-05-08 – 2012-05-09 (×2): via INTRAVENOUS
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
## 2012-05-07 NOTE — Progress Notes (Signed)
Forebag felt during SVE.

## 2012-05-07 NOTE — MAU Note (Signed)
contractions 

## 2012-05-07 NOTE — MAU Note (Signed)
Order given to get ultrasound to determine AFI. If AFI is normal, discharge patient per Dr Tamela Oddi.

## 2012-05-07 NOTE — Anesthesia Procedure Notes (Signed)
Epidural Patient location during procedure: OB Start time: 05/07/2012 11:01 AM End time: 05/07/2012 11:05 AM  Staffing Anesthesiologist: Sandrea Hughs Performed by: anesthesiologist   Preanesthetic Checklist Completed: patient identified, site marked, surgical consent, pre-op evaluation, timeout performed, IV checked, risks and benefits discussed and monitors and equipment checked  Epidural Patient position: sitting Prep: site prepped and draped and DuraPrep Patient monitoring: continuous pulse ox and blood pressure Approach: midline Injection technique: LOR air  Needle:  Needle type: Tuohy  Needle gauge: 17 G Needle length: 9 cm and 9 Needle insertion depth: 5 cm cm Catheter type: closed end flexible Catheter size: 19 Gauge Catheter at skin depth: 10 cm Test dose: negative and Other  Assessment Sensory level: T9 Events: blood not aspirated, injection not painful, no injection resistance, negative IV test and no paresthesia  Additional Notes Reason for block:procedure for pain

## 2012-05-07 NOTE — Anesthesia Preprocedure Evaluation (Addendum)
Anesthesia Evaluation  Patient identified by MRN, date of birth, ID band Patient awake    Reviewed: Allergy & Precautions, H&P , NPO status , Patient's Chart, lab work & pertinent test results  History of Anesthesia Complications (+) PONV  Airway Mallampati: I TM Distance: >3 FB Neck ROM: full    Dental no notable dental hx. (+) Dental Advisory Given   Pulmonary neg pulmonary ROS,    Pulmonary exam normal       Cardiovascular negative cardio ROS      Neuro/Psych PSYCHIATRIC DISORDERS PTSDnegative neurological ROS     GI/Hepatic negative GI ROS, Neg liver ROS,   Endo/Other  negative endocrine ROS  Renal/GU negative Renal ROS  negative genitourinary   Musculoskeletal negative musculoskeletal ROS (+)   Abdominal Normal abdominal exam  (+)   Peds negative pediatric ROS (+)  Hematology negative hematology ROS (+)   Anesthesia Other Findings   Reproductive/Obstetrics (+) Pregnancy                          Anesthesia Physical Anesthesia Plan  ASA: II and emergent  Anesthesia Plan: Epidural   Post-op Pain Management:    Induction:   Airway Management Planned:   Additional Equipment:   Intra-op Plan:   Post-operative Plan:   Informed Consent: I have reviewed the patients History and Physical, chart, labs and discussed the procedure including the risks, benefits and alternatives for the proposed anesthesia with the patient or authorized representative who has indicated his/her understanding and acceptance.   Dental advisory given  Plan Discussed with: CRNA and Surgeon  Anesthesia Plan Comments:        Anesthesia Quick Evaluation

## 2012-05-08 ENCOUNTER — Encounter (HOSPITAL_COMMUNITY): Payer: Self-pay | Admitting: *Deleted

## 2012-05-08 DIAGNOSIS — O4290 Premature rupture of membranes, unspecified as to length of time between rupture and onset of labor, unspecified weeks of gestation: Secondary | ICD-10-CM | POA: Diagnosis present

## 2012-05-08 MED ORDER — OXYTOCIN 40 UNITS IN LACTATED RINGERS INFUSION - SIMPLE MED: 1.0000 m[IU]/min | INTRAVENOUS | Status: DC

## 2012-05-08 NOTE — Progress Notes (Signed)
[name] Hale [place] is Hale 32 y.o. G1P0000 at [redacted]w[redacted]d by LMP admitted for rupture of membranes  Subjective: Comfortable  Objective: BP 103/65  Pulse 67  Temp(Src) 98 F (36.7 C) (Oral)  Resp 20  Ht 4\' 9"  (1.448 m)  Wt 63.504 kg (140 lb)  BMI 30.29 kg/m2  SpO2 99% I/O last 3 completed shifts: In: -  Out: 1000 [Urine:1000] Total I/O In: -  Out: 850 [Urine:850]  FHT:  FHR: 140 bpm, variability: moderate,  accelerations:  Present,  decelerations:  Absent UC:   irregular, every 4 minutes SVE:   Dilation: 5 Effacement (%): 80 Station: -1;0 Exam by:: [name] Lucks, RN  Labs: Lab Results  Component Value Date   WBC 12.9* 05/07/2012   HGB 12.7 05/07/2012   HCT 36.2 05/07/2012   MCV 91.4 05/07/2012   PLT 217 05/07/2012    Assessment / Plan: Protracted latent phase  Labor: see above Preeclampsia:  n/Hale Fetal Wellbeing:  Category I Pain Control:  Epidural I/D:  no signs/sxs of chorioamnionitis Anticipated MOD:  NSVD  [name] Hale 05/08/2012, 12:18 PM

## 2012-05-08 NOTE — Progress Notes (Signed)
Dr. Tamela Oddi notified of pt status, SVE, forebag noted on SVE, FHR, UC pattern, and pt denies pain at this time.  Will continue to monitor.

## 2012-05-08 NOTE — Progress Notes (Addendum)
Spoke with Dr. Tamela Oddi via phone. Discussed vaginal exam and uterine activity. Plan to stop pitocin for 2 hours, then restart pitocin at 34mu/min and increase 2 mu every 45 minutes to a max of 60mu/min. Discussed plan with patient using interpreter Maday. Pt ok with plan at this time.

## 2012-05-08 NOTE — Progress Notes (Signed)
1511 FHR gradually decreased to 90-100 bpm with intermittent tracing due to pt's position changes, for 2-3 minutes.  Pt repositioned and FHR readjusted several times.  Will continue to monitor.

## 2012-05-08 NOTE — Progress Notes (Signed)
Dr. Tamela Oddi notified of pt status, SVE, FHR, UC pattern, pitocin amount, pt's vital signs, and pt denies pain.  Orders received to increase pitocin per protocol.  Will continue to monitor.

## 2012-05-09 ENCOUNTER — Inpatient Hospital Stay (HOSPITAL_COMMUNITY): Payer: Medicaid Other | Admitting: Anesthesiology

## 2012-05-09 ENCOUNTER — Encounter (HOSPITAL_COMMUNITY): Payer: Self-pay

## 2012-05-09 ENCOUNTER — Encounter (HOSPITAL_COMMUNITY)
Admission: AD | Disposition: A | Discharge status: Home / Self Care | Payer: Self-pay | Source: Ambulatory Visit | Attending: Obstetrics & Gynecology

## 2012-05-09 ENCOUNTER — Encounter (HOSPITAL_COMMUNITY): Payer: Self-pay | Admitting: Anesthesiology

## 2012-05-09 SURGERY — Surgical Case
Anesthesia: Epidural | Wound class: Clean Contaminated

## 2012-05-09 MED ORDER — OXYCODONE-ACETAMINOPHEN 5-325 MG PO TABS
1.0000 | ORAL_TABLET | ORAL | Status: DC | PRN
  Administered 2012-05-10 – 2012-05-11 (×4): 1 via ORAL
  Administered 2012-05-12 (×2): 2 via ORAL
  Filled 2012-05-09 (×2): qty 2
  Filled 2012-05-09 (×4): qty 1

## 2012-05-09 MED ORDER — FENTANYL CITRATE 0.05 MG/ML IJ SOLN: 25.0000 ug | INTRAMUSCULAR | Status: DC | PRN

## 2012-05-09 MED ORDER — KETOROLAC TROMETHAMINE 30 MG/ML IJ SOLN: 30.0000 mg | Freq: Four times a day (QID) | INTRAMUSCULAR | Status: AC | PRN

## 2012-05-09 MED ORDER — ONDANSETRON HCL 4 MG PO TABS: 4.0000 mg | ORAL_TABLET | ORAL | Status: DC | PRN

## 2012-05-09 MED ORDER — SCOPOLAMINE 1 MG/3DAYS TD PT72: 1.0000 | MEDICATED_PATCH | Freq: Once | TRANSDERMAL | Status: AC

## 2012-05-09 MED ORDER — FERROUS SULFATE 325 (65 FE) MG PO TABS
325.0000 mg | ORAL_TABLET | Freq: Two times a day (BID) | ORAL | Status: DC
  Administered 2012-05-09 – 2012-05-12 (×6): 325 mg via ORAL
  Filled 2012-05-09 (×7): qty 1

## 2012-05-09 MED ORDER — DIPHENHYDRAMINE HCL 50 MG/ML IJ SOLN: 25.0000 mg | INTRAMUSCULAR | Status: DC | PRN

## 2012-05-09 MED ORDER — SODIUM BICARBONATE 8.4 % IV SOLN
INTRAVENOUS | Status: DC | PRN
  Administered 2012-05-09: 5 mL via EPIDURAL

## 2012-05-09 MED ORDER — SODIUM CHLORIDE 0.9 % IR SOLN
Status: DC | PRN
  Administered 2012-05-09: 1000 mL

## 2012-05-09 MED ORDER — DIPHENHYDRAMINE HCL 25 MG PO CAPS: 25.0000 mg | ORAL_CAPSULE | ORAL | Status: DC | PRN

## 2012-05-09 MED ORDER — LACTATED RINGERS IV SOLN
INTRAVENOUS | Status: DC
  Administered 2012-05-09: 18:00:00 via INTRAVENOUS

## 2012-05-09 MED ORDER — EPHEDRINE 5 MG/ML INJ
INTRAVENOUS | Status: AC
  Filled 2012-05-09: qty 10

## 2012-05-09 MED ORDER — PRENATAL MULTIVITAMIN CH
1.0000 | ORAL_TABLET | Freq: Every day | ORAL | Status: DC
  Administered 2012-05-10 – 2012-05-12 (×3): 1 via ORAL
  Filled 2012-05-09 (×3): qty 1

## 2012-05-09 MED ORDER — ONDANSETRON HCL 4 MG/2ML IJ SOLN
INTRAMUSCULAR | Status: DC | PRN
  Administered 2012-05-09: 4 mg via INTRAVENOUS

## 2012-05-09 MED ORDER — LIDOCAINE-EPINEPHRINE (PF) 2 %-1:200000 IJ SOLN
INTRAMUSCULAR | Status: AC
  Filled 2012-05-09: qty 20

## 2012-05-09 MED ORDER — DIPHENHYDRAMINE HCL 25 MG PO CAPS: 25.0000 mg | ORAL_CAPSULE | Freq: Four times a day (QID) | ORAL | Status: DC | PRN

## 2012-05-09 MED ORDER — CLINDAMYCIN HCL 300 MG PO CAPS
300.0000 mg | ORAL_CAPSULE | Freq: Two times a day (BID) | ORAL | Status: DC
  Administered 2012-05-09 – 2012-05-12 (×6): 300 mg via ORAL
  Filled 2012-05-09 (×8): qty 1

## 2012-05-09 MED ORDER — LANOLIN HYDROUS EX OINT: 1.0000 "application " | TOPICAL_OINTMENT | CUTANEOUS | Status: DC | PRN

## 2012-05-09 MED ORDER — EPHEDRINE SULFATE 50 MG/ML IJ SOLN
INTRAMUSCULAR | Status: DC | PRN
  Administered 2012-05-09 (×3): 5 mg via INTRAVENOUS

## 2012-05-09 MED ORDER — MEASLES, MUMPS & RUBELLA VAC ~~LOC~~ INJ
0.5000 mL | INJECTION | Freq: Once | SUBCUTANEOUS | Status: DC
  Filled 2012-05-09: qty 0.5

## 2012-05-09 MED ORDER — NALBUPHINE SYRINGE 5 MG/0.5 ML
5.0000 mg | INJECTION | INTRAMUSCULAR | Status: DC | PRN
  Filled 2012-05-09: qty 1

## 2012-05-09 MED ORDER — METOCLOPRAMIDE HCL 5 MG/ML IJ SOLN: 10.0000 mg | Freq: Three times a day (TID) | INTRAMUSCULAR | Status: DC | PRN

## 2012-05-09 MED ORDER — ZOLPIDEM TARTRATE 5 MG PO TABS: 5.0000 mg | ORAL_TABLET | Freq: Every evening | ORAL | Status: DC | PRN

## 2012-05-09 MED ORDER — OXYTOCIN 40 UNITS IN LACTATED RINGERS INFUSION - SIMPLE MED: 1.0000 m[IU]/min | INTRAVENOUS | Status: DC

## 2012-05-09 MED ORDER — SODIUM BICARBONATE 8.4 % IV SOLN
INTRAVENOUS | Status: AC
  Filled 2012-05-09: qty 50

## 2012-05-09 MED ORDER — LACTATED RINGERS IV SOLN: INTRAVENOUS | Status: DC

## 2012-05-09 MED ORDER — MORPHINE SULFATE (PF) 0.5 MG/ML IJ SOLN
INTRAMUSCULAR | Status: DC | PRN
  Administered 2012-05-09: 3 mg via EPIDURAL

## 2012-05-09 MED ORDER — IBUPROFEN 600 MG PO TABS
600.0000 mg | ORAL_TABLET | Freq: Four times a day (QID) | ORAL | Status: DC
  Administered 2012-05-09 – 2012-05-12 (×9): 600 mg via ORAL
  Filled 2012-05-09 (×2): qty 1

## 2012-05-09 MED ORDER — CEFAZOLIN SODIUM-DEXTROSE 2-3 GM-% IV SOLR
2.0000 g | Freq: Once | INTRAVENOUS | Status: AC
  Administered 2012-05-09: 2 g via INTRAVENOUS
  Filled 2012-05-09: qty 50

## 2012-05-09 MED ORDER — IBUPROFEN 600 MG PO TABS
600.0000 mg | ORAL_TABLET | Freq: Four times a day (QID) | ORAL | Status: DC | PRN
  Administered 2012-05-10 – 2012-05-11 (×2): 600 mg via ORAL
  Filled 2012-05-09 (×8): qty 1

## 2012-05-09 MED ORDER — ONDANSETRON HCL 4 MG/2ML IJ SOLN: 4.0000 mg | INTRAMUSCULAR | Status: DC | PRN

## 2012-05-09 MED ORDER — MORPHINE SULFATE 0.5 MG/ML IJ SOLN
INTRAMUSCULAR | Status: AC
  Filled 2012-05-09: qty 10

## 2012-05-09 MED ORDER — NALOXONE HCL 0.4 MG/ML IJ SOLN: 0.4000 mg | INTRAMUSCULAR | Status: DC | PRN

## 2012-05-09 MED ORDER — SCOPOLAMINE 1 MG/3DAYS TD PT72
MEDICATED_PATCH | TRANSDERMAL | Status: AC
  Administered 2012-05-09: 1.5 mg via TRANSDERMAL
  Filled 2012-05-09: qty 1

## 2012-05-09 MED ORDER — ONDANSETRON HCL 4 MG/2ML IJ SOLN
INTRAMUSCULAR | Status: AC
  Filled 2012-05-09: qty 2

## 2012-05-09 MED ORDER — MAGNESIUM HYDROXIDE 400 MG/5ML PO SUSP: 30.0000 mL | ORAL | Status: DC | PRN

## 2012-05-09 MED ORDER — SIMETHICONE 80 MG PO CHEW
80.0000 mg | CHEWABLE_TABLET | ORAL | Status: DC | PRN
  Administered 2012-05-09 – 2012-05-11 (×4): 80 mg via ORAL

## 2012-05-09 MED ORDER — PROMETHAZINE HCL 25 MG/ML IJ SOLN: 6.2500 mg | INTRAMUSCULAR | Status: DC | PRN

## 2012-05-09 MED ORDER — DIBUCAINE 1 % RE OINT: 1.0000 "application " | TOPICAL_OINTMENT | RECTAL | Status: DC | PRN

## 2012-05-09 MED ORDER — KETOROLAC TROMETHAMINE 30 MG/ML IJ SOLN
30.0000 mg | Freq: Four times a day (QID) | INTRAMUSCULAR | Status: AC | PRN
  Administered 2012-05-09: 30 mg via INTRAVENOUS
  Filled 2012-05-09: qty 1

## 2012-05-09 MED ORDER — SODIUM CHLORIDE 0.9 % IJ SOLN: 3.0000 mL | INTRAMUSCULAR | Status: DC | PRN

## 2012-05-09 MED ORDER — LACTATED RINGERS IV SOLN
INTRAVENOUS | Status: DC | PRN
  Administered 2012-05-09 (×4): via INTRAVENOUS

## 2012-05-09 MED ORDER — OXYTOCIN 10 UNIT/ML IJ SOLN
40.0000 [IU] | INTRAVENOUS | Status: DC | PRN
  Administered 2012-05-09: 40 [IU] via INTRAVENOUS

## 2012-05-09 MED ORDER — OXYTOCIN 40 UNITS IN LACTATED RINGERS INFUSION - SIMPLE MED: 62.5000 mL/h | INTRAVENOUS | Status: AC

## 2012-05-09 MED ORDER — ONDANSETRON HCL 4 MG/2ML IJ SOLN: 4.0000 mg | Freq: Three times a day (TID) | INTRAMUSCULAR | Status: DC | PRN

## 2012-05-09 MED ORDER — WITCH HAZEL-GLYCERIN EX PADS: 1.0000 "application " | MEDICATED_PAD | CUTANEOUS | Status: DC | PRN

## 2012-05-09 MED ORDER — DIPHENHYDRAMINE HCL 50 MG/ML IJ SOLN: 12.5000 mg | INTRAMUSCULAR | Status: DC | PRN

## 2012-05-09 MED ORDER — TETANUS-DIPHTH-ACELL PERTUSSIS 5-2.5-18.5 LF-MCG/0.5 IM SUSP: 0.5000 mL | Freq: Once | INTRAMUSCULAR | Status: DC

## 2012-05-09 MED ORDER — KETOROLAC TROMETHAMINE 30 MG/ML IJ SOLN
INTRAMUSCULAR | Status: AC
  Administered 2012-05-09: 30 mg via INTRAVENOUS
  Filled 2012-05-09: qty 1

## 2012-05-09 MED ORDER — MEPERIDINE HCL 25 MG/ML IJ SOLN: 6.2500 mg | INTRAMUSCULAR | Status: DC | PRN

## 2012-05-09 MED ORDER — SENNOSIDES-DOCUSATE SODIUM 8.6-50 MG PO TABS
2.0000 | ORAL_TABLET | Freq: Every day | ORAL | Status: DC
  Administered 2012-05-09 – 2012-05-11 (×3): 2 via ORAL

## 2012-05-09 MED ORDER — OXYTOCIN 10 UNIT/ML IJ SOLN
INTRAMUSCULAR | Status: AC
  Filled 2012-05-09: qty 4

## 2012-05-09 MED ORDER — DEXTROSE 5 % IV SOLN
1.0000 ug/kg/h | INTRAVENOUS | Status: DC | PRN
  Filled 2012-05-09: qty 2

## 2012-05-09 SURGICAL SUPPLY — 41 items
BENZOIN TINCTURE PRP APPL 2/3 (GAUZE/BANDAGES/DRESSINGS) ×2 IMPLANT
CANISTER WOUND CARE 500ML ATS (WOUND CARE) IMPLANT
CLOTH BEACON ORANGE TIMEOUT ST (SAFETY) ×2 IMPLANT
CONTAINER PREFILL 10% NBF 15ML (MISCELLANEOUS) IMPLANT
DRAPE LG THREE QUARTER DISP (DRAPES) ×2 IMPLANT
DRESSING TELFA 8X3 (GAUZE/BANDAGES/DRESSINGS) IMPLANT
DRSG OPSITE POSTOP 4X10 (GAUZE/BANDAGES/DRESSINGS) ×2 IMPLANT
DRSG VAC ATS LRG SENSATRAC (GAUZE/BANDAGES/DRESSINGS) IMPLANT
DRSG VAC ATS MED SENSATRAC (GAUZE/BANDAGES/DRESSINGS) IMPLANT
DRSG VAC ATS SM SENSATRAC (GAUZE/BANDAGES/DRESSINGS) IMPLANT
DURAPREP 26ML APPLICATOR (WOUND CARE) ×2 IMPLANT
ELECT REM PT RETURN 9FT ADLT (ELECTROSURGICAL) ×2
ELECTRODE REM PT RTRN 9FT ADLT (ELECTROSURGICAL) ×1 IMPLANT
EXTRACTOR VACUUM M CUP 4 TUBE (SUCTIONS) IMPLANT
GAUZE SPONGE 4X4 12PLY STRL LF (GAUZE/BANDAGES/DRESSINGS) IMPLANT
GLOVE BIO SURGEON STRL SZ 6.5 (GLOVE) ×2 IMPLANT
GOWN STRL REIN XL XLG (GOWN DISPOSABLE) ×4 IMPLANT
KIT ABG SYR 3ML LUER SLIP (SYRINGE) IMPLANT
NEEDLE HYPO 25X5/8 SAFETYGLIDE (NEEDLE) IMPLANT
NS IRRIG 1000ML POUR BTL (IV SOLUTION) ×2 IMPLANT
PACK C SECTION WH (CUSTOM PROCEDURE TRAY) ×2 IMPLANT
PAD ABD 7.5X8 STRL (GAUZE/BANDAGES/DRESSINGS) IMPLANT
PAD OB MATERNITY 4.3X12.25 (PERSONAL CARE ITEMS) ×2 IMPLANT
RTRCTR C-SECT PINK 25CM LRG (MISCELLANEOUS) IMPLANT
SLEEVE SCD COMPRESS KNEE MED (MISCELLANEOUS) IMPLANT
STAPLER VISISTAT 35W (STAPLE) IMPLANT
STRIP CLOSURE SKIN 1/2X4 (GAUZE/BANDAGES/DRESSINGS) ×2 IMPLANT
SUT MNCRL 0 VIOLET CTX 36 (SUTURE) ×2 IMPLANT
SUT MNCRL AB 3-0 PS2 27 (SUTURE) ×2 IMPLANT
SUT MONOCRYL 0 CTX 36 (SUTURE) ×2
SUT PDS AB 0 CTX 36 PDP370T (SUTURE) IMPLANT
SUT PLAIN 0 NONE (SUTURE) IMPLANT
SUT VIC AB 0 CTXB 36 (SUTURE) ×4 IMPLANT
SUT VIC AB 2-0 CT1 (SUTURE) ×2 IMPLANT
SUT VIC AB 2-0 CT1 27 (SUTURE) ×1
SUT VIC AB 2-0 CT1 TAPERPNT 27 (SUTURE) ×1 IMPLANT
SUT VIC AB 2-0 SH 27 (SUTURE)
SUT VIC AB 2-0 SH 27XBRD (SUTURE) IMPLANT
TOWEL OR 17X24 6PK STRL BLUE (TOWEL DISPOSABLE) ×6 IMPLANT
TRAY FOLEY CATH 14FR (SET/KITS/TRAYS/PACK) IMPLANT
WATER STERILE IRR 1000ML POUR (IV SOLUTION) ×2 IMPLANT

## 2012-05-09 NOTE — Progress Notes (Signed)
Patient ID: Margart Zemanek [place], female   DOB: [birth year], 32 y.o.   MRN: [id number] Fiza Nation Claiborne Rigg is a 32 y.o. G1P0000 at [redacted]w[redacted]d by LMP admitted for rupture of membranes  Subjective: Comfortable  Objective: BP 101/61  Pulse 64  Temp(Src) 98.7 F (37.1 C) (Oral)  Resp 20  Ht 4\' 9"  (1.448 m)  Wt 63.504 kg (140 lb)  BMI 30.29 kg/m2  SpO2 93% I/O last 3 completed shifts: In: -  Out: 2600 [Urine:2600] Total I/O In: -  Out: 1525 [Urine:1525]  FHT:  FHR: 140 bpm, variability: moderate,  accelerations:  Present,  decelerations:  Absent UC:   irregular, every 4 minutes SVE:   Dilation: 6.5 Effacement (%): 100 Station: -1 Exam by:: H Stone RN    Assessment / Plan: Protracted latent phase  Labor: see above Preeclampsia:  n/a Fetal Wellbeing:  Category I Pain Control:  Epidural I/D:  no signs/sxs of chorioamnionitis Anticipated MOD:  C/D  JACKSON-MOORE,Shawndra Clute A 05/09/2012, 6:56 AM

## 2012-05-09 NOTE — Op Note (Signed)
Cesarean Section Procedure Note   [name] Hale   05/07/2012 - 05/09/2012  Indications: Protracted latent phase   Pre-operative Diagnosis: Protracted latent phase, IUP @ [redacted]w[redacted]d   Post-operative Diagnosis: Same   Surgeon: Antionette Char A  Assistants: none  Anesthesia: epidural  Procedure Details:  The patient was seen in the Holding Room. The risks, benefits, complications, treatment options, and expected outcomes were discussed with the patient. The patient concurred with the proposed plan, giving informed consent. The patient was identified as [name] and the procedure verified as C-Section Delivery. A Time Out was held and the above information confirmed.  After induction of anesthesia, the patient was draped and prepped in the usual sterile manner. A transverse incision was made and carried down through the subcutaneous tissue to the fascia. The fascial incision was made and extended transversely. The fascia was separated from the underlying rectus tissue superiorly and inferiorly. The peritoneum was identified and entered. The peritoneal incision was extended longitudinally. The utero-vesical peritoneal reflection was incised transversely and the bladder flap was bluntly freed from the lower uterine segment. A low transverse uterine incision was made. Delivered from cephalic presentation was a  living newborn female infant(s). APGAR (1 MIN): 8   APGAR (5 MINS): 9       A cord ph was not sent. The umbilical cord was clamped and cut cord. A sample was obtained for evaluation. The placenta was removed Intact and appeared normal.  The uterine incision was closed with running locked sutures of 1-0 Monocryl. A second imbricating layer of the same suture was placed.  Hemostasis was observed. The paracolic gutters were irrigated. The parieto peritoneum was closed in a running fashion with 2-0 Vicryl.  The fascia was then reapproximated with running sutures of 0 Vicryl.  The  skin was closed with suture.  Instrument, sponge, and needle counts were correct prior the abdominal closure and were correct at the conclusion of the case.    Findings: Loose nuchal cord x 1   Estimated Blood Loss: * No blood loss amount entered *   Total IV Fluids: per Anesthesiology  Urine Output: 300CC OF tea colored urine  Specimens: Placenta  Complications: no complications  Disposition: PACU - hemodynamically stable.  Maternal Condition: stable   Baby condition / location:  nursery-stable    Signed: Surgeon(s): Antionette Char, MD

## 2012-05-09 NOTE — Anesthesia Postprocedure Evaluation (Signed)
  Anesthesia Post-op Note  Patient: [name] Hale  Procedure(s) Performed: Procedure(s) (LRB): Primary cesarean section with delivery of baby girl at 59. Apgars 8/9. (N/A)  Patient Location: PACU  Anesthesia Type: Epidural  Level of Consciousness: awake and alert   Airway and Oxygen Therapy: Patient Spontanous Breathing  Post-op Pain: mild  Post-op Assessment: Post-op Vital signs reviewed, Patient's Cardiovascular Status Stable, Respiratory Function Stable, Patent Airway and No signs of Nausea or vomiting  Last Vitals:  Filed Vitals:   05/09/12 0900  BP: 99/52  Pulse: 83  Temp:   Resp: 17    Post-op Vital Signs: stable   Complications: No apparent anesthesia complications

## 2012-05-09 NOTE — OR Nursing (Addendum)
Uterus massaged by S. Kaiesha Tonner Charity fundraiser. Two tubes of cord blood sent to lab.  Foley catheter in on arrival to OR. Urine color- bloody.  20cc of blood evacuated from uterus during uterine massage.

## 2012-05-09 NOTE — Transfer of Care (Signed)
Immediate Anesthesia Transfer of Care Note  Patient: [name] Hale  Procedure(s) Performed: Procedure(s): Primary cesarean section with delivery of baby girl at 28. Apgars 8/9. (N/A)  Patient Location: PACU  Anesthesia Type:Epidural  Level of Consciousness: awake, alert  and oriented  Airway & Oxygen Therapy: Patient Spontanous Breathing  Post-op Assessment: Report given to PACU RN and Post -op Vital signs reviewed and stable  Post vital signs: Reviewed and stable  Complications: No apparent anesthesia complications

## 2012-05-09 NOTE — Anesthesia Postprocedure Evaluation (Signed)
Anesthesia Post Note  Patient: [name] Hale  Procedure(s) Performed: Procedure(s) (LRB): Primary cesarean section with delivery of baby girl at 53. Apgars 8/9. (N/A)  Anesthesia type: Epidural  Patient location: Mother/Baby  Post pain: Pain level controlled  Post assessment: Post-op Vital signs reviewed  Last Vitals:  Filed Vitals:   05/09/12 1500  BP:   Pulse: 84  Temp:   Resp: 19    Post vital signs: Reviewed  Level of consciousness:alert  Complications: No apparent anesthesia complications

## 2012-05-10 LAB — CBC
HCT: 26.5 % — ABNORMAL LOW (ref 36.0–46.0)
MCH: 31.6 pg (ref 26.0–34.0)
MCV: 91.1 fL (ref 78.0–100.0)
RDW: 13.5 % (ref 11.5–15.5)
WBC: 14.8 10*3/uL — ABNORMAL HIGH (ref 4.0–10.5)

## 2012-05-10 NOTE — Clinical Social Work Note (Signed)
Clinical Social Work Department PSYCHOSOCIAL ASSESSMENT - MATERNAL/CHILD 05/10/2012  Patient:  [name] Hale  Account Number:  [id number]  Admit Date:  05/07/2012  Childs Name:    Clinical Social Worker:  RACHEL VAUGHN, LCSW   Date/Time:  05/10/2012 02:00 PM  Date Referred:  05/10/2012   Referral source  Physician  RN     Referred reason  Depression/Anxiety   Other referral source:    I:  FAMILY / HOME ENVIRONMENT Child's legal guardian:  PARENT  Guardian - Name Guardian - Age Guardian - Address  [name] Hale 31 [address] [place], [place] [address]  [name] Hale  [address] [place], [place] [address]   Other household support members/support persons Other support:    II  PSYCHOSOCIAL DATA Information Source:  Patient Interview  Financial and Community Resources Employment:   Financial resources:  Medicaid If Medicaid - County:  [place] Other  WIC   School / Grade:   Maternity Care Coordinator / Child Services Coordination / Early Interventions:  Cultural issues impacting care:   speaks limited english    III  STRENGTHS Strengths  Compliance with medical plan  Home prepared for Child (including basic supplies)  Adequate Resources  Supportive family/friends   Strength comment:    IV  RISK FACTORS AND CURRENT PROBLEMS Current Problem:  None   Risk Factor & Current Problem Patient Issue Family Issue Risk Factor / Current Problem Comment   N N     V  SOCIAL WORK ASSESSMENT CSW and Inter pretor met with MOB alone at bedside.  CSW discussed emotional state and any current concerns for MOB. MOB reports no depression symptoms or SI.  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  MOB expressed she was in counseling before she was pregnant and feels she does not need counseling services at this time.  She reports this is sensitive to her and she would not like  to discuss it with anyone at the hospital at this time.  CSW expressed understanding and instructed MOB to let CSW or RN know if she would like any resources.  CSW discussed supplies and family support.  MOB reports good family support and FOB is aware of hx and is very supportive.  MOB reports no concerns with supplies or medicaid.  Please reconsult CSW if further needs arise.  No barriers to discharge at this time.      VI SOCIAL WORK PLAN Social Work Plan  No Further Intervention Required / No Barriers to Discharge   Type of pt/family education:   If child protective services report - county:   If child protective services report - date:   Information/referral to community resources comment:   Other social work plan:    

## 2012-05-10 NOTE — Progress Notes (Signed)
Patient ID: [name] Hale, female   DOB: Jun 16, 1980, 32 y.o.   MRN: [id number] Subjective: POD# 1 s/p Cesarean Delivery.  Indications: failure to progress  RH status/Rubella reviewed. Feeding: breast Patient reports tolerating PO.  Denies HA/SOB/C/P/N/V/dizziness.  Breast symptoms: no.  She reports vaginal bleeding as normal, without clots.  She is ambulating, urinating without difficulty.     Objective: Vital signs in last 24 hours: BP 89/50  Pulse 76  Temp(Src) 98 F (36.7 C) (Oral)  Resp 18  Ht 4\' 9"  (1.448 m)  Wt 63.504 kg (140 lb)  BMI 30.29 kg/m2  SpO2 95%       Physical Exam:  General: alert CV: Regular rate and rhythm Resp: clear Abdomen: soft, nontender, normal bowel sounds Lochia: minimal Uterine Fundus: firm, below umbilicus, nontender Incision: dried heme Ext: extremities normal, atraumatic, no cyanosis or edema    Recent Labs  05/07/12 1000 05/10/12 0522  HGB 12.7 9.2*  HCT 36.2 26.5*      Assessment/Plan: 32 y.o.  status post Cesarean section. POD# 1.   Doing well, stable. Anemia secondary to blood loss              Advance diet as tolerated Start po pain meds D/C foley  HLIV  Ambulate IS Routine post-op care  JACKSON-MOORE,LISA A 05/10/2012, 8:35 AM

## 2012-05-11 ENCOUNTER — Encounter (HOSPITAL_COMMUNITY): Payer: Self-pay | Admitting: Obstetrics & Gynecology

## 2012-05-11 LAB — HEMOGLOBIN AND HEMATOCRIT, BLOOD
HCT: 26.2 % — ABNORMAL LOW (ref 36.0–46.0)
Hemoglobin: 8.9 g/dL — ABNORMAL LOW (ref 12.0–15.0)

## 2012-05-11 NOTE — Progress Notes (Signed)
Patient ID: [name] Hale, female   DOB: 17-Mar-1980, 32 y.o.   MRN: [id number] Subjective: POD# 2 s/p Cesarean Delivery.  Indications: failure to progress  RH status/Rubella reviewed. Feeding: breast Patient reports tolerating PO.  Denies HA/SOB/C/P/N/V/dizziness.  Breast symptoms: no.  She reports vaginal bleeding as normal, without clots.  She is ambulating, urinating without difficulty.     Objective: Vital signs in last 24 hours: BP 93/61  Pulse 73  Temp(Src) 97.5 F (36.4 C) (Oral)  Resp 18  Ht 4\' 9"  (1.448 m)  Wt 63.504 kg (140 lb)  BMI 30.29 kg/m2  SpO2 95%       Physical Exam:  General: alert CV: Regular rate and rhythm Resp: clear Abdomen: soft, nontender, normal bowel sounds Lochia: minimal Uterine Fundus: firm, below umbilicus, nontender Incision: dried heme Ext: extremities normal, atraumatic, no cyanosis or edema    Recent Labs  05/10/12 0522 05/11/12 0540  HGB 9.2* 8.9*  HCT 26.5* 26.2*      Assessment/Plan: 32 y.o.  status post Cesarean section. POD# 1.   Doing well, stable. Anemia secondary to blood loss, stable              Ambulate IS Routine post-op care  JACKSON-MOORE,LISA A 05/11/2012, 8:08 AM

## 2012-05-11 NOTE — Progress Notes (Signed)
UR chart review completed.  

## 2012-05-12 MED ORDER — IBUPROFEN 600 MG PO TABS: 600.0000 mg | ORAL_TABLET | Freq: Four times a day (QID) | ORAL | Status: DC | PRN

## 2012-05-12 MED ORDER — FERROUS SULFATE 325 (65 FE) MG PO TABS: 325.0000 mg | ORAL_TABLET | Freq: Two times a day (BID) | ORAL | Status: DC

## 2012-05-12 MED ORDER — OXYCODONE-ACETAMINOPHEN 5-325 MG PO TABS: 1.0000 | ORAL_TABLET | ORAL | Status: DC | PRN

## 2012-05-12 NOTE — Progress Notes (Signed)
Subjective: Postpartum Day 3: Cesarean Delivery Patient reports tolerating PO, + flatus, + BM and no problems voiding.    Objective: Vital signs in last 24 hours: Temp:  [97.9 F (36.6 C)-98 F (36.7 C)] 97.9 F (36.6 C) (03/04 0551) Pulse Rate:  [67-82] 82 (03/04 0551) Resp:  [18-20] 20 (03/04 0551) BP: (117-131)/(65-79) 131/65 mmHg (03/04 0551)  Physical Exam:  General: alert and no distress Lochia: appropriate Uterine Fundus: firm Incision: healing well DVT Evaluation: No evidence of DVT seen on physical exam.   Recent Labs  05/10/12 0522 05/11/12 0540  HGB 9.2* 8.9*  HCT 26.5* 26.2*    Assessment/Plan: Status post Cesarean section. Doing well postoperatively.  Discharge home with standard precautions and return to clinic in 2 weeks.  HARPER,CHARLES A 05/12/2012, 8:29 AM

## 2012-05-12 NOTE — Discharge Summary (Signed)
Obstetric Discharge Summary Reason for Admission: rupture of membranes Prenatal Procedures: ultrasound Intrapartum Procedures: cesarean: low cervical, transverse Postpartum Procedures: none Complications-Operative and Postpartum: none Hemoglobin  Date Value Range Status  05/11/2012 8.9* 12.0 - 15.0 g/dL Final     HCT  Date Value Range Status  05/11/2012 26.2* 36.0 - 46.0 % Final    Physical Exam:  General: alert and no distress Lochia: appropriate Uterine Fundus: firm Incision: healing well DVT Evaluation: No evidence of DVT seen on physical exam.  Discharge Diagnoses: Term Pregnancy-delivered  Discharge Information: Date: 05/12/2012 Activity: pelvic rest Diet: routine Medications: PNV, Ibuprofen, Colace, Iron and Percocet Condition: stable Instructions: refer to practice specific booklet Discharge to: home Follow-up Information   Follow up with HARPER,CHARLES A, MD. Schedule an appointment as soon as possible for a visit in 2 weeks.   Contact information:   [address] [place] [address] [phone]       Newborn Data: Live born female  Birth Weight: 7 lb 0.2 oz (3180 g) APGAR: 8, 9  Home with mother.  HARPER,CHARLES A 05/12/2012, 8:37 AM

## 2012-05-13 ENCOUNTER — Ambulatory Visit: Payer: Self-pay

## 2012-05-13 NOTE — Lactation Note (Signed)
This note was copied from the chart of [name]. Lactation Consultation Note  Patient Name: [name] Hale [id number] Date: 05/13/2012 Reason for consult: Follow-up assessment   Maternal Data    Feeding   LATCH Score/Interventions Latch: Repeated attempts needed to sustain latch, nipple held in mouth throughout feeding, stimulation needed to elicit sucking reflex.  Audible Swallowing: None  Type of Nipple: Flat  Comfort (Breast/Nipple): Filling, red/small blisters or bruises, mild/mod discomfort Problem noted: Engorgment Intervention(s): Ice;Hand expression  Interventions (Filling): Frequent nursing;Double electric pump Interventions (Mild/moderate discomfort): Hand massage;Hand expression  Hold (Positioning): Assistance needed to correctly position infant at breast and maintain latch.  LATCH Score: 4  Lactation Tools Discussed/Used Tools: Nipple [name] Hale Program: Yes Pump Review: Setup, frequency, and cleaning   Consult Status Consult Status: Complete  Mom attempting to put baby to breast when I went in with [name] Hale. Mom reports that she has been having a very hard time getting the baby to latch. Has been using NS. Mom reports that she is very frustrated with breast feeding. Suggested pumping and bottle feeding EBM for now. Mom pleased with that suggestion. Pumping when I left room. Mom reports that DEBP does soften breast. Mom has WIC and plans to get pump from them at 1 pm this afternoon. No questions at present.  [name] Hale 05/13/2012, 7:51 AM

## 2012-05-27 ENCOUNTER — Ambulatory Visit: Payer: Medicaid Other | Admitting: *Deleted

## 2012-05-27 VITALS — BP 113/71 | HR 62 | Temp 97.8°F | Ht <= 58 in | Wt 122.0 lb

## 2012-05-27 DIAGNOSIS — O9089 Other complications of the puerperium, not elsewhere classified: Secondary | ICD-10-CM

## 2012-06-04 ENCOUNTER — Ambulatory Visit (HOSPITAL_COMMUNITY): Payer: Medicaid Other

## 2012-06-04 ENCOUNTER — Other Ambulatory Visit (HOSPITAL_COMMUNITY): Payer: No Typology Code available for payment source

## 2012-06-09 ENCOUNTER — Ambulatory Visit (HOSPITAL_COMMUNITY)
Admission: RE | Admit: 2012-06-09 | Discharge: 2012-06-09 | Disposition: A | Discharge status: Home / Self Care | Payer: Medicaid Other | Source: Ambulatory Visit | Attending: Obstetrics & Gynecology | Admitting: Obstetrics & Gynecology

## 2012-06-09 DIAGNOSIS — O9089 Other complications of the puerperium, not elsewhere classified: Secondary | ICD-10-CM

## 2012-06-09 DIAGNOSIS — O909 Complication of the puerperium, unspecified: Secondary | ICD-10-CM | POA: Insufficient documentation

## 2012-06-12 ENCOUNTER — Encounter: Payer: Self-pay | Admitting: *Deleted

## 2012-06-17 ENCOUNTER — Ambulatory Visit (INDEPENDENT_AMBULATORY_CARE_PROVIDER_SITE_OTHER): Payer: Medicaid Other | Admitting: Obstetrics & Gynecology

## 2012-06-17 ENCOUNTER — Encounter: Payer: Self-pay | Admitting: Obstetrics & Gynecology

## 2012-06-17 VITALS — BP 96/63 | HR 74 | Temp 97.8°F | Wt 118.0 lb

## 2012-06-17 DIAGNOSIS — N390 Urinary tract infection, site not specified: Secondary | ICD-10-CM

## 2012-06-17 LAB — POCT URINALYSIS DIPSTICK
Blood, UA: NEGATIVE
Ketones, UA: NEGATIVE
Protein, UA: NEGATIVE
Spec Grav, UA: 1.02
pH, UA: 5

## 2012-06-17 LAB — POCT WET PREP (WET MOUNT)

## 2012-06-17 MED ORDER — CLINDAMYCIN HCL 300 MG PO CAPS: 300.0000 mg | ORAL_CAPSULE | Freq: Two times a day (BID) | ORAL | Status: DC

## 2012-06-17 NOTE — Progress Notes (Signed)
Subjective:     [name] Hale is a 32 y.o. female who presents for a postpartum visit. She is 5 weeks postpartum following a low cervical transverse Cesarean section. I have fully reviewed the prenatal and intrapartum course. The delivery was at 40 gestational weeks. Outcome: primary cesarean section, low transverse incision. Anesthesia: epidural. Postpartum course has been normal Baby's course has been normal. Baby is feeding by breast. Bleeding clear. Bowel function is normal. Bladder function is abnormal: very little urination and burning with urination . Patient is not sexually active. Contraception method is abstinence. Postpartum depression screening: negative. Pt states she has noticed a "lump inside of her vagina that is causing some burning with urination."  . The following portions of the patient's history were reviewed and updated as appropriate: allergies, current medications, past family history, past medical history, past social history, past surgical history and problem list.  Review of Systems Pertinent items are noted in HPI.   Objective:    BP 96/63  Pulse 74  Temp(Src) 97.8 F (36.6 C) (Oral)  Wt 118 lb (53.524 kg)  BMI 24.67 kg/m2  Breastfeeding? Yes        General:  alert     Abdomen: soft, non-tender; bowel sounds normal; no masses,  no organomegaly; incision well-healed   Vulva:  normal  Vagina: normal vagina; scant white discharge; pH> 4.5  Cervix:  no lesions  Corpus: normal size, contour, position, consistency, mobility, non-tender  Adnexa:  normal adnexa   Assessment:   Likely BV.  Pap smear done at today's visit.   Plan:    1. Contraception: plans Nexplanon 2. Clindamycin 3. Follow up in: 2 weeks or as needed.

## 2012-06-17 NOTE — Patient Instructions (Signed)
Vaginitis (Vaginitis) La vaginitis es una infeccin. Produce dolor, hinchazn y enrojecimiento (inflamacin) de la vagina. Muchas de estas infecciones estn causadas por enfermedades de transmisin sexual. Child psychotherapist sexuales sin proteccin puede causar otros problemas y complicaciones tales como:   Dolor crnico en la pelvis.  Infertilidad.  Embarazo no deseado.  Aborto.  Embarazo tubario.  Contagio de la infeccin al el recin nacido.  Cncer. CAUSAS  Monilia. Una infeccin por hongos o levaduras. Esta no es una enfermedad de transmisin sexual.  Vaginosis bacteriana. El balance de bacterias normales de la vagina se trastoca y se reemplaza con un sobrecremiento de cierta bacteria.  Gonorrea, clamidia. Estas son infecciones bacterianas y enfermedades de transmisin sexual.  Esponjas vaginales, diafragmas y dispositivos intrauterinos.  Tricomoniasis. Se trata de una infeccin de transmisin sexual causada por un parsito.  Virus como el herpes y el virus del papiloma humano. Ambos son enfermedades de transmisin sexual. (ETS)  Psychiatrist.  Inmunosupresin. Esto ocurre con Theatre manager, tales como infeccin por VIH o cncer.  Usar bao de burbujas.  Tomar ciertos antibiticos.  Puede haber recurrencias espordicas si se enferma.  Diabetes.  Corticoides.  Reacciones alrgicas Si tiene Vella Raring a:  Las duchas vaginales.  Jabones.  Los espermicidas.  Condones.  Tampones perfumados o sprays vaginales. SNTOMAS  Flujo vaginal anormal.  Picazn de la vagina.  Dolor vaginal.  Hinchazn de la vagina. En algunos casos, no hay sntomas. TRATAMIENTO El tratamiento puede variar segn el tipo de infeccin.  Las bacterias o tricomonas generalmente se tratan con antibiticos por va oral y en algunos casos con cremas o supositorios vaginales.  La vaginitis por Monilia suele tratarse con cremas vaginales, supositorios o comprimidos antimicticos  por via oral.  La vaginitis viral no tiene tratamiento. Pero los sntomas del herpes (vaginitis viral) pueden tratarse para [employer]. El virus del papiloma humano no presenta sntomas. Sin embargo, existen tratamientos para las enfermedades causadas por virus del Geneticist, molecular.  Si sufre vaginitis alrgica, debe dejar de utilizar el producto que causa el problema. Las cremas vaginales debern utilizarse para tratar los sntomas.  Cuando se trata una enfermedad de transmisin sexual, tambin debe tratarse a su pareja. INSTRUCCIONES PARA EL CUIDADO DOMICILIARIO  Tome todos los medicamentos tal como se los prescribi el mdico.  No utilice tampones perfumados, jabones o aerosoles vaginales.  No utilice duchas vaginales.  Informe a su compaero sexual si tiene una infeccin vaginal o sufre una ETS.  No tenga relaciones sexuales hasta que haya tratado la vaginitis.  Practique sexo seguro con condones. SOLICITE ATENCIN MDICA SI:  Siente dolor abdominal.  Los sntomas empeoran durante el tratamiento. Document Released: 06/13/2008 Document Revised: 05/20/2011 [facility] Patient Information 2013 [place], [place]. Etonogestrel implant Qu es este medicamento? El ETONOGESTREL es un dispositivo anticonceptivo (control de la natalidad). Se utiliza para Neurosurgeon. Se puede utilizar hasta 3 aos. Este medicamento puede ser utilizado para otros usos; si tiene alguna pregunta consulte con su proveedor de atencin mdica o con su farmacutico. Qu le debo informar a mi profesional de la salud antes de tomar este medicamento? Necesita saber si usted presenta alguno de los siguientes problemas o situaciones: -sangrado vaginal anormal -enfermedad vascular o cogulos sanguneos -cncer de mama, cervical, heptico -depresin -diabetes -enfermedad de la vescula biliar -dolores de cabeza -enfermedad cardiaca o ataque cardiaco reciente -alta presin sangunea -alto nivel  de colesterol -enfermedad renal -enfermedad heptica -convulsiones -fuma tabaco -una reaccin alrgica o inusual al etonogestrel, otras hormonas, anestsicos o antispticos, medicamentos,  alimentos, colorantes o conservantes -si est embarazada o buscando quedar embarazada -si est amamantando a un beb Cmo debo utilizar este medicamento? Este dispositivo se inserta debajo de la piel en la cara interna de la parte superior del brazo por un profesional de Radiographer, therapeutic. Hable con su pediatra para informarse acerca del uso de este medicamento en nios. Puede requerir atencin especial. Sobredosis: Pngase en contacto inmediatamente con un centro toxicolgico o una sala de urgencia si usted cree que haya tomado demasiado medicamento. ATENCIN: [employer] es solo para usted. No comparta este medicamento con nadie. Qu sucede si me olvido de una dosis? No se aplica en este caso. Qu puede interactuar con este medicamento? No tome esta medicina con ninguno de los siguientes medicamentos: -amprenavir -bosentano -fosamprenavir  Esta medicina tambin puede interactuar con los siguientes medicamentos: -medicamentos barbitricos para inducir el sueo o tratar convulsiones -ciertos medicamentos para las infecciones micticas tales como quetoconazol e itraconazol -griseofulvina -medicamentos para tratar convulsiones, tales como carbamazepina, felbamato, oxcarbazepina, fenitona, topiramato -modafinil -fenilbutazona -rifampicina -algunos medicamentos para tratar la infeccin por VIH tales como atazanavir, indinavir, lopinavir, nelfinavir, tipranavir, ritonavir -hierba de [address] ser que esta lista no menciona todas las posibles interacciones. Informe a su profesional de [employer] de [employer] productos a base de hierbas, medicamentos de [place] o suplementos nutritivos que est tomando. Si usted fuma, consume bebidas alcohlicas o si utiliza drogas ilegales, indqueselo tambin a su  profesional de [employer]. Algunas sustancias pueden interactuar con su medicamento. A qu debo estar atento al usar [employer]? Este medicamento no protege contra la infeccin por el VIH (SIDA) o otras enfermedades de transmisin sexual. Usted debe sentir el implante al presionar con las yemas de los dedos sobre la piel donde se insert. Dgale a su mdico si no se siente el implante. Qu efectos secundarios puedo tener al [employer] este medicamento? Efectos secundarios que debe informar a su mdico o a Producer, television/film/video de la salud tan pronto como sea posible: -Therapist, art como erupcin cutnea, picazn o urticarias, hinchazn de la cara, labios o lengua -ndulos mamarios -cambios en la visin -confusin, dificultad para hablar o entender -orina de color oscura -humor deprimido -sensacin general de estar enfermo o sntomas gripales -heces claras -prdida del apetito, nuseas -dolor en la regin abdominal superior derecha -dolores de Advertising account executive, hinchazon o sensibilidad grave en el abdomen -falta de aliento, dolor en el pecho, hinchazn de la pierna -seales de Chartered loss adjuster -entumecimiento o debilidad repentina de la cara, brazo o pierna -dificultad para caminar, mareos, prdida de equilibrio o coordinacin -sangrado o flujo vaginal inusual -cansancio o debilidad inusual -color amarillento de los ojos o la piel  Efectos secundarios que, por lo general, no requieren Psychologist, prison and probation services (debe informarlos a su mdico o a Producer, television/film/video de la salud si persisten o si son molestos): -acn -dolor de pecho -cambios de peso -tos -fiebre o escalofros -dolor de cabeza -sangrado menstrual irregular -picazn, ardo o flujo vaginal -dolor o dificultad para Geographical information systems officer -dolor de garganta Puede ser que esta lista no menciona todos los posibles efectos secundarios. Comunquese a su mdico por asesoramiento mdico [employer]. Usted puede informar los efectos  secundarios a la FDA por telfono al 1-800-FDA-1088. Dnde debo guardar mi medicina? Este medicamento se administra en hospitales o clnicas y no necesitar guardarlo en su domicilio. ATENCIN: Este folleto es un resumen. Puede ser que no cubra toda la posible informacin. Si usted tiene preguntas acerca de [employer],  consulte con su mdico, su farmacutico o su profesional de [employer].  2013, Elsevier/Gold Standard. (06/25/2011 11:44:43 AM)

## 2012-06-18 LAB — WET PREP BY MOLECULAR PROBE
Gardnerella vaginalis: NEGATIVE
Trichomonas vaginosis: NEGATIVE

## 2012-06-19 LAB — PAP IG, CT-NG, RFX HPV ASCU
Chlamydia Probe Amp: NEGATIVE
GC Probe Amp: NEGATIVE

## 2012-07-06 ENCOUNTER — Encounter: Payer: Self-pay | Admitting: Obstetrics & Gynecology

## 2012-07-06 ENCOUNTER — Ambulatory Visit (INDEPENDENT_AMBULATORY_CARE_PROVIDER_SITE_OTHER): Payer: Medicaid Other | Admitting: Obstetrics & Gynecology

## 2012-07-06 VITALS — BP 103/68 | HR 65 | Temp 97.4°F | Ht <= 58 in | Wt 113.0 lb

## 2012-07-06 DIAGNOSIS — F329 Major depressive disorder, single episode, unspecified: Secondary | ICD-10-CM

## 2012-07-06 DIAGNOSIS — F53 Postpartum depression: Secondary | ICD-10-CM

## 2012-07-06 DIAGNOSIS — O99345 Other mental disorders complicating the puerperium: Secondary | ICD-10-CM | POA: Insufficient documentation

## 2012-07-06 DIAGNOSIS — Z30017 Encounter for initial prescription of implantable subdermal contraceptive: Secondary | ICD-10-CM

## 2012-07-06 DIAGNOSIS — F3289 Other specified depressive episodes: Secondary | ICD-10-CM

## 2012-07-06 MED ORDER — ETONOGESTREL 68 MG ~~LOC~~ IMPL
68.0000 mg | DRUG_IMPLANT | Freq: Once | SUBCUTANEOUS | Status: AC
  Administered 2012-07-06: 68 mg via SUBCUTANEOUS

## 2012-07-06 MED ORDER — CONCEPT DHA 53.5-38-1 MG PO CAPS: 1.0000 | ORAL_CAPSULE | Freq: Every morning | ORAL | Status: DC

## 2012-07-06 MED ORDER — SERTRALINE HCL 50 MG PO TABS: 50.0000 mg | ORAL_TABLET | Freq: Every day | ORAL | Status: DC

## 2012-07-06 NOTE — Patient Instructions (Addendum)
Etonogestrel implant Qu es este medicamento? El ETONOGESTREL es un dispositivo anticonceptivo (control de la natalidad). Se utiliza para Neurosurgeon. Se puede utilizar hasta 5 aos. Este medicamento puede ser utilizado para otros usos; si tiene alguna pregunta consulte con su proveedor de atencin mdica o con su farmacutico. Qu le debo informar a mi profesional de la salud antes de tomar este medicamento? Necesita saber si usted presenta alguno de los siguientes problemas o situaciones: -sangrado vaginal anormal -enfermedad vascular o cogulos sanguneos -cncer de mama, cervical, heptico -depresin -diabetes -enfermedad de la vescula biliar -dolores de cabeza -enfermedad cardiaca o ataque cardiaco reciente -alta presin sangunea -alto nivel de colesterol -enfermedad renal -enfermedad heptica -convulsiones -fuma tabaco -una reaccin alrgica o inusual al etonogestrel, otras hormonas, anestsicos o antispticos, medicamentos, alimentos, colorantes o conservantes -si est embarazada o buscando quedar embarazada -si est amamantando a un beb Cmo debo utilizar este medicamento? Este dispositivo se inserta debajo de la piel en la cara interna de la parte superior del brazo por un profesional de Radiographer, therapeutic. Hable con su pediatra para informarse acerca del uso de este medicamento en nios. Puede requerir atencin especial. Sobredosis: Pngase en contacto inmediatamente con un centro toxicolgico o una sala de urgencia si usted cree que haya tomado demasiado medicamento. ATENCIN: [employer] es solo para usted. No comparta este medicamento con nadie. Qu sucede si me olvido de una dosis? No se aplica en este caso. Qu puede interactuar con este medicamento? No tome esta medicina con ninguno de los siguientes medicamentos: -amprenavir -bosentano -fosamprenavir  Esta medicina tambin puede interactuar con los siguientes medicamentos: -medicamentos barbitricos  para inducir el sueo o tratar convulsiones -ciertos medicamentos para las infecciones micticas tales como quetoconazol e itraconazol -griseofulvina -medicamentos para tratar convulsiones, tales como carbamazepina, felbamato, oxcarbazepina, fenitona, topiramato -modafinil -fenilbutazona -rifampicina -algunos medicamentos para tratar la infeccin por VIH tales como atazanavir, indinavir, lopinavir, nelfinavir, tipranavir, ritonavir -hierba de [address] ser que esta lista no menciona todas las posibles interacciones. Informe a su profesional de [employer] de [employer] productos a base de hierbas, medicamentos de [place] o suplementos nutritivos que est tomando. Si usted fuma, consume bebidas alcohlicas o si utiliza drogas ilegales, indqueselo tambin a su profesional de [employer]. Algunas sustancias pueden interactuar con su medicamento. A qu debo estar atento al usar [employer]? Este medicamento no protege contra la infeccin por el VIH (SIDA) o otras enfermedades de transmisin sexual. Usted debe sentir el implante al presionar con las yemas de los dedos sobre la piel donde se insert. Dgale a su mdico si no se siente el implante. Qu efectos secundarios puedo tener al [employer] este medicamento? Efectos secundarios que debe informar a su mdico o a Producer, television/film/video de la salud tan pronto como sea posible: -Therapist, art como erupcin cutnea, picazn o urticarias, hinchazn de la cara, labios o lengua -ndulos mamarios -cambios en la visin -confusin, dificultad para hablar o entender -orina de color oscura -humor deprimido -sensacin general de estar enfermo o sntomas gripales -heces claras -prdida del apetito, nuseas -dolor en la regin abdominal superior derecha -dolores de Advertising account executive, hinchazon o sensibilidad grave en el abdomen -falta de aliento, dolor en el pecho, hinchazn de la pierna -seales de Chartered loss adjuster -entumecimiento o debilidad  repentina de la cara, brazo o pierna -dificultad para caminar, mareos, prdida de equilibrio o coordinacin -sangrado o flujo vaginal inusual -cansancio o debilidad inusual -color amarillento de los ojos o la piel  Efectos secundarios que,  por lo general, no requieren atencin mdica (debe informarlos a su mdico o a su profesional de la salud si persisten o si son molestos): -acn -dolor de pecho -cambios de peso -tos -fiebre o escalofros -dolor de cabeza -sangrado menstrual irregular -picazn, ardo o flujo vaginal -dolor o dificultad para Geographical information systems officer -dolor de garganta Puede ser que esta lista no menciona todos los posibles efectos secundarios. Comunquese a su mdico por asesoramiento mdico [employer]. Usted puede informar los efectos secundarios a la FDA por telfono al 1-800-FDA-1088. Dnde debo guardar mi medicina? Este medicamento se administra en hospitales o clnicas y no necesitar guardarlo en su domicilio. ATENCIN: Este folleto es un resumen. Puede ser que no cubra toda la posible informacin. Si usted tiene preguntas acerca de esta medicina, consulte con su mdico, su farmacutico o su profesional de Radiographer, therapeutic.  2013, Elsevier/Gold Standard. (06/25/2011 11:44:43 AM)

## 2012-07-06 NOTE — Progress Notes (Signed)
NEXPLANON INSERTION NOTE  Date of LMP:   Pt is Breast feeding. No LMP  Contraception used: abstience  Pregnancy test result: Negative 07/06/2012  Indications:  The patient desires contraception.  She understands risks, benefits, and alternatives to Implanon and would like to proceed.  Anesthesia:   Lidocaine 1% plain.  Procedure:  A time-out was performed confirming the procedure and the patient's allergy status.  The patient's non-dominant was identified as the left arm.  The protection cap was removed. While placing countertraction on the skin, the needle was inserted at a 30 degree angle.  The applicator was held horizontal to the skin; the skin was tented upward as the needle was introduced into the subdermal space.  While holding the applicator in place, the slider was unlocked. The Nexplanon was removed from the field.  The Nexplanon was palpated to ensure proper placement.  Complications: None  Instructions:  The patient was instructed to remove the dressing in 24 hours and that some bruising is to be expected.  She was advised to use over the counter analgesics as needed for any pain at the site.  She is to keep the area dry for 24 hours and to call if her hand or arm becomes cold, numb, or blue.  Return visit:  Return in 6+ weeks

## 2012-08-19 ENCOUNTER — Ambulatory Visit: Payer: Medicaid Other | Admitting: Obstetrics & Gynecology

## 2012-08-24 ENCOUNTER — Ambulatory Visit (INDEPENDENT_AMBULATORY_CARE_PROVIDER_SITE_OTHER): Payer: Medicaid Other | Admitting: Obstetrics & Gynecology

## 2012-08-24 ENCOUNTER — Encounter: Payer: Self-pay | Admitting: Obstetrics & Gynecology

## 2012-08-24 VITALS — BP 104/66 | HR 94 | Temp 97.7°F | Wt 109.4 lb

## 2012-08-24 DIAGNOSIS — B373 Candidiasis of vulva and vagina: Secondary | ICD-10-CM

## 2012-08-24 DIAGNOSIS — Z975 Presence of (intrauterine) contraceptive device: Secondary | ICD-10-CM

## 2012-08-24 MED ORDER — FLUCONAZOLE 150 MG PO TABS: 150.0000 mg | ORAL_TABLET | Freq: Once | ORAL | Status: DC

## 2012-08-24 NOTE — Progress Notes (Signed)
.   Subjective:     [name] Hale is a 32 y.o. female here for a follow up visit.  She had the Nexplanon inserted 06/17/12.  She also needs a refill on her Zoloft.  No current complaints.  Personal health questionnaire reviewed: no.   Gynecologic History No LMP recorded. Patient is not currently having periods (Reason: Lactating). Contraception: Nexplanon Last Pap: 2014. Results were: normal Last mammogram:N/A  Obstetric History OB History   Grav Para Term Preterm Abortions TAB SAB Ect Mult Living   1 1 1  0 0 0 0 0 0 1     # Outc Date GA Lbr Len/2nd Wgt Sex Del Anes PTL Lv   1 TRM 3/14 [redacted]w[redacted]d 00:00 7lb0.2oz(3.18kg) F LVCS EPI  Yes       The following portions of the patient's history were reviewed and updated as appropriate: allergies, current medications, past family history, past medical history, past social history, past surgical history and problem list.  Review of Systems Genitourinary:positive for vaginal itching incisional pain    Objective:   General:  alert     Abdomen: soft, non-tender; bowel sounds normal; no masses,  no organomegaly                    Assessment:    ?incisional pain--ddx: entrapped nerve, trigger point Likely candida vulvaginitis  Plan:   Supportive measures Diflucan Return prn

## 2012-08-24 NOTE — Patient Instructions (Signed)
Vulvovaginitis Candidiásica  (Candidal Vulvovaginitis)  La vulvovaginitis candidiásica es una infección de la vagina y la vulva. La vulva es la piel que rodea la abertura de la vagina. Puede causar picazón y molestias dentro de y alrededor de la vagina.   CUIDADOS EN EL HOGAR  · Sólo tome medicamentos como lo indique su médico.  · No mantenga relaciones sexuales hasta que la infección haya curado o según le indique el médico.  · Practique sexo seguro.  · Informe a su compañero sexual acerca de su infección.  · No tome duchas vaginales ni use tampones.  · Use ropa interior de algodón. No utilice pantalones ni pantimedias ajustados.  · Coma yogur. Esto puede ayudar a tratar y prevenir las infecciones por cándida.  SOLICITE AYUDA DE INMEDIATO SI:   · Tiene fiebre.  · Los problemas empeoran durante el tratamiento, o si no mejora luego de 3 días.  · Tiene malestar, irritación, o picazón en la zona de la vagina o la vulva.  · Siente dolor en al mantener relaciones sexuales.  · Comienza a sentir dolor abdominal.  ASEGÚRESE DE QUE:  · Comprende estas instrucciones.  · Controlará su enfermedad.  · Solicitará ayuda de inmediato si no mejora o empeora.  Document Released: 03/30/2010 Document Revised: 05/20/2011  ExitCare® Patient Information ©2014 [employer].

## 2012-11-10 ENCOUNTER — Ambulatory Visit: Payer: Medicaid Other | Admitting: Obstetrics

## 2012-11-18 ENCOUNTER — Ambulatory Visit: Payer: Medicaid Other | Admitting: Obstetrics

## 2012-11-24 ENCOUNTER — Inpatient Hospital Stay (HOSPITAL_COMMUNITY)
Admission: AD | Admit: 2012-11-24 | Discharge: 2012-11-25 | Disposition: A | Discharge status: Home / Self Care | Payer: Medicaid Other | Source: Ambulatory Visit | Attending: Obstetrics | Admitting: Obstetrics

## 2012-11-24 DIAGNOSIS — Z3202 Encounter for pregnancy test, result negative: Secondary | ICD-10-CM | POA: Insufficient documentation

## 2012-11-24 DIAGNOSIS — IMO0001 Reserved for inherently not codable concepts without codable children: Secondary | ICD-10-CM | POA: Insufficient documentation

## 2012-11-24 DIAGNOSIS — M549 Dorsalgia, unspecified: Secondary | ICD-10-CM | POA: Insufficient documentation

## 2012-11-24 DIAGNOSIS — M7918 Myalgia, other site: Secondary | ICD-10-CM

## 2012-11-24 NOTE — MAU Note (Signed)
Pt reports back pain since June 18th, pain is in the middle of her back from top to bottom. Has been taking tylenol for the pain but it doesn't help. States the pain is constant.

## 2012-11-25 ENCOUNTER — Encounter (HOSPITAL_COMMUNITY): Payer: Self-pay | Admitting: *Deleted

## 2012-11-25 DIAGNOSIS — IMO0001 Reserved for inherently not codable concepts without codable children: Secondary | ICD-10-CM

## 2012-11-25 LAB — CBC
MCV: 88.6 fL (ref 78.0–100.0)
Platelets: 265 10*3/uL (ref 150–400)
RBC: 3.94 MIL/uL (ref 3.87–5.11)
WBC: 9 10*3/uL (ref 4.0–10.5)

## 2012-11-25 LAB — URINALYSIS, ROUTINE W REFLEX MICROSCOPIC
Leukocytes, UA: NEGATIVE
Nitrite: NEGATIVE
Specific Gravity, Urine: 1.025 (ref 1.005–1.030)
Urobilinogen, UA: 0.2 mg/dL (ref 0.0–1.0)

## 2012-11-25 LAB — POCT PREGNANCY, URINE: Preg Test, Ur: NEGATIVE

## 2012-11-25 MED ORDER — CYCLOBENZAPRINE HCL 10 MG PO TABS
10.0000 mg | ORAL_TABLET | Freq: Once | ORAL | Status: AC
  Administered 2012-11-25: 10 mg via ORAL
  Filled 2012-11-25: qty 1

## 2012-11-25 MED ORDER — CYCLOBENZAPRINE HCL 10 MG PO TABS: 5.0000 mg | ORAL_TABLET | Freq: Three times a day (TID) | ORAL | Status: DC | PRN

## 2012-11-25 MED ORDER — TRAMADOL HCL 50 MG PO TABS: 100.0000 mg | ORAL_TABLET | Freq: Once | ORAL | Status: DC
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
## 2012-12-01 ENCOUNTER — Ambulatory Visit: Payer: Medicaid Other | Admitting: Obstetrics

## 2013-05-30 IMAGING — US US ABDOMEN LIMITED
3 series · 14 of 25 positions shown · non-contrast
Comparison: None.

CLINICAL DATA: Pain at site of recent cesarean section incision.

LIMITED ABDOMINAL ULTRASOUND

[Series 1: us abdomen limited · 6 of 12 slices shown (1 of 3)]
[im 1/12]
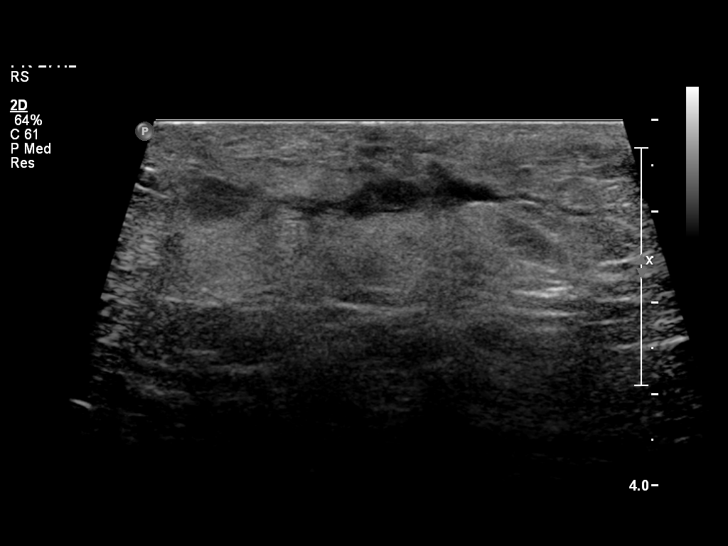
[im 3/12]
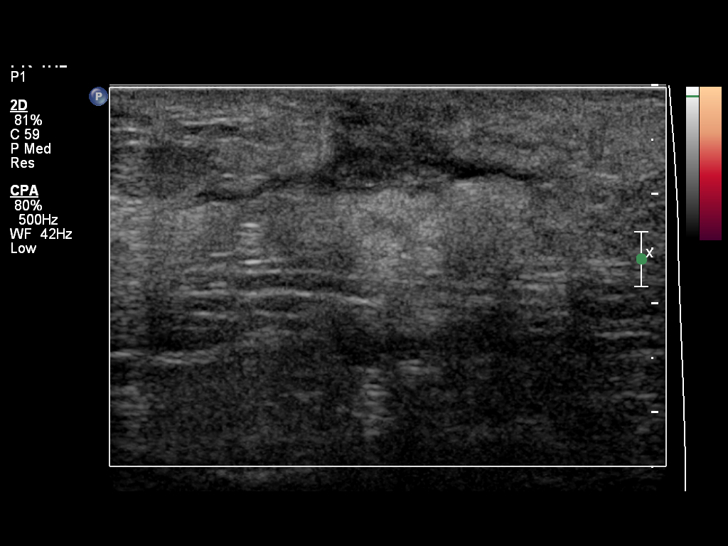
[im 5/12]
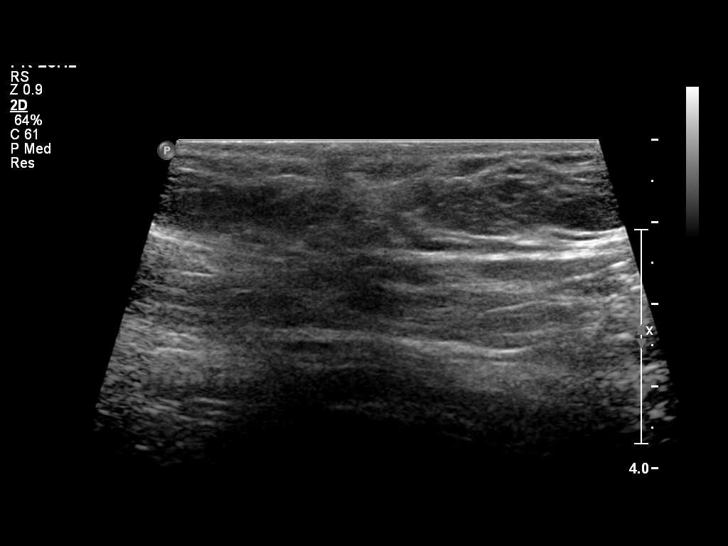
[im 8/12]
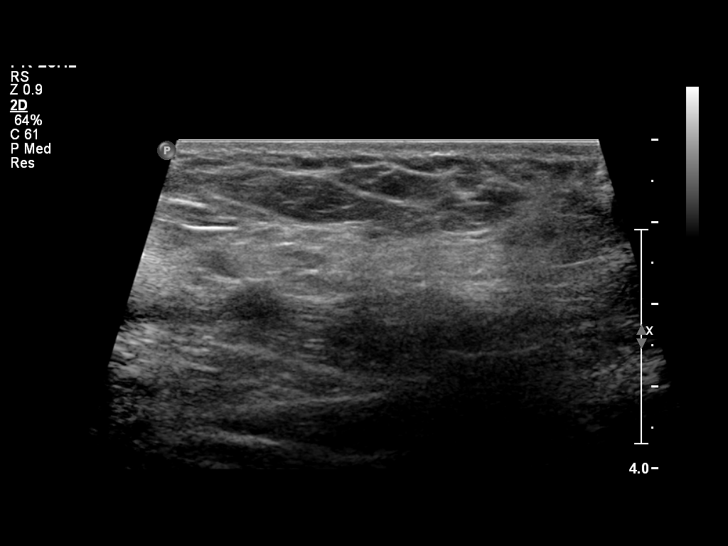
[im 10/12]
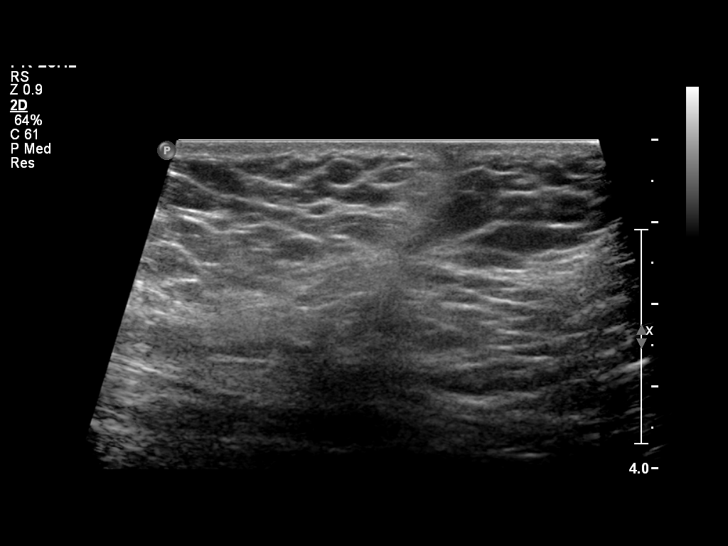
[im 12/12]
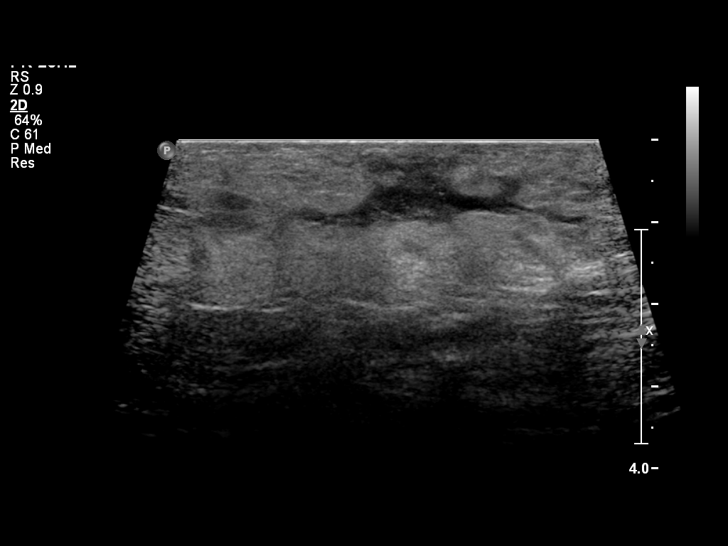

[Series 1: us abdomen limited · 12 acquisitions, 5 frames shown (2 of 3)]
[im 2/12]
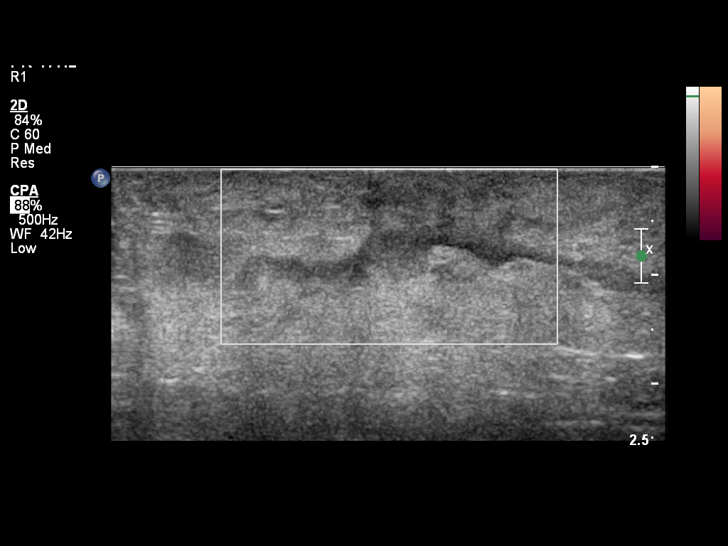
[im 4/12]
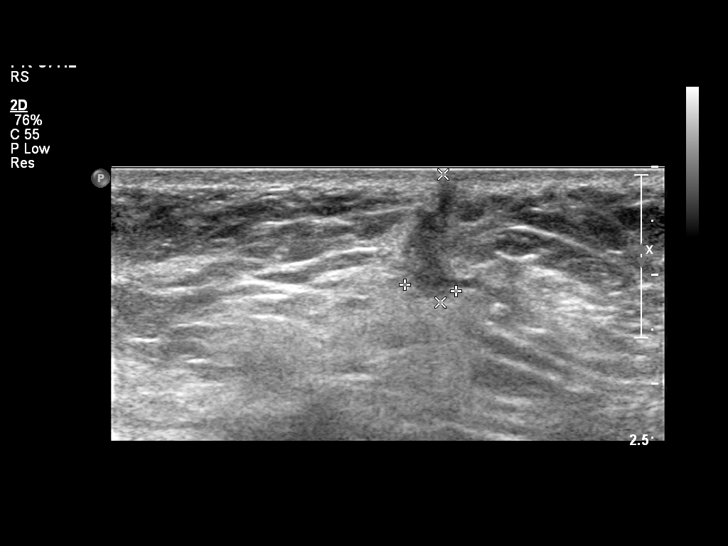
[im 7/12]
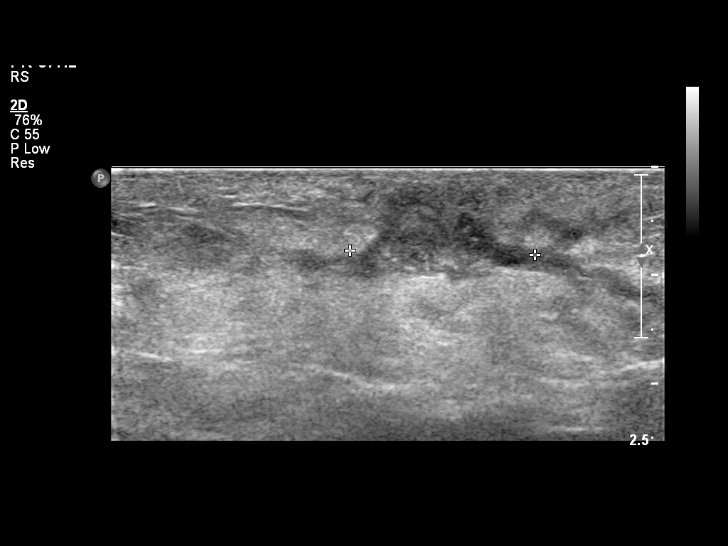
[im 8/12]
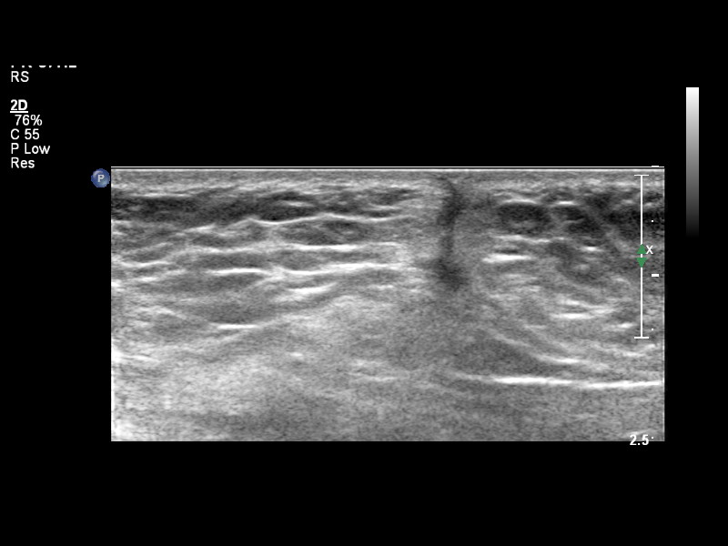
[im 10/12]
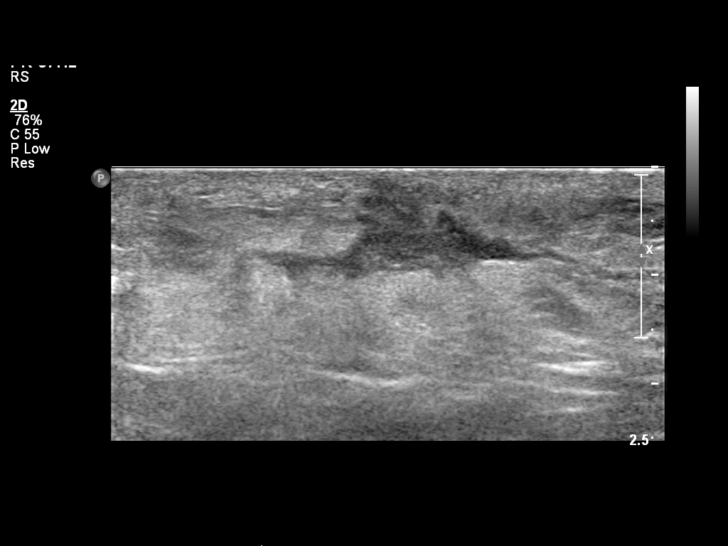

[Series 1: us abdomen limited · 3 of 6 slices shown (3 of 3)]
[im 1/6]
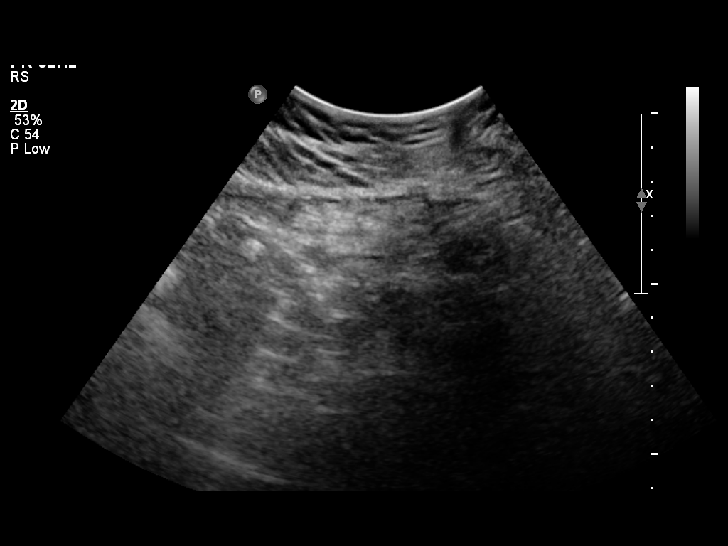
[im 3/6]
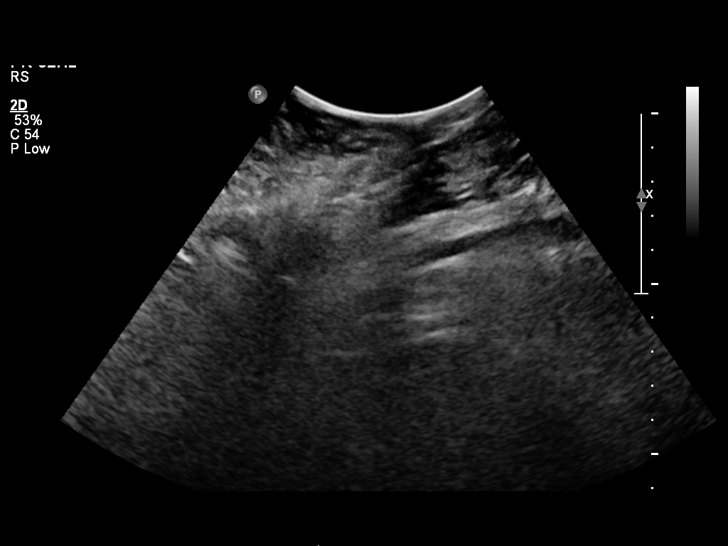
[im 6/6]
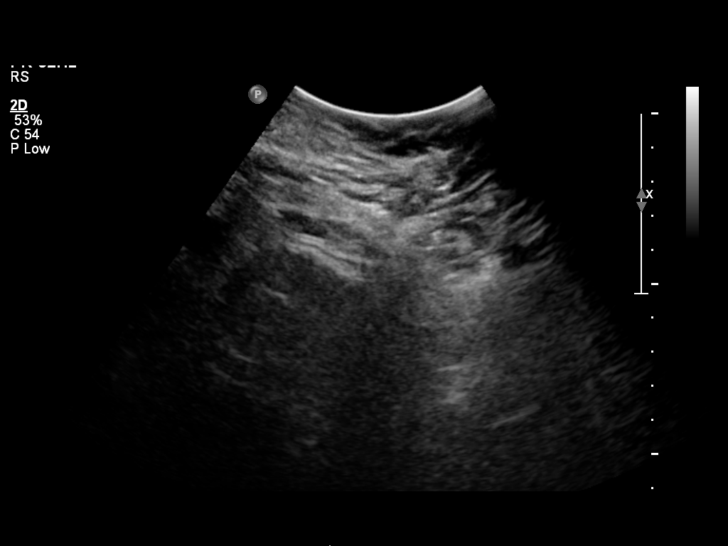

[14 of 25 positions shown; findings below may reference images not displayed]

FINDINGS: Targeted ultrasound examination of the anterior abdominal
wall was performed in the area of the prior C-section incision.
Heterogeneous appearance of the soft tissue is noted at the site of
the C-section incision including soft tissue edema and fibrosis.
No drainable fluid collection identified.
IMPRESSION: Expected postop changes in the anterior abdominal wall soft tissues
at site of C-section incision.  No drainable fluid collection
identified.

## 2013-11-01 ENCOUNTER — Encounter (HOSPITAL_COMMUNITY): Payer: Self-pay | Admitting: Emergency Medicine

## 2013-11-01 DIAGNOSIS — Z79899 Other long term (current) drug therapy: Secondary | ICD-10-CM | POA: Insufficient documentation

## 2013-11-01 DIAGNOSIS — R109 Unspecified abdominal pain: Secondary | ICD-10-CM | POA: Insufficient documentation

## 2013-11-01 DIAGNOSIS — Z3202 Encounter for pregnancy test, result negative: Secondary | ICD-10-CM | POA: Diagnosis not present

## 2013-11-01 DIAGNOSIS — Z8739 Personal history of other diseases of the musculoskeletal system and connective tissue: Secondary | ICD-10-CM | POA: Insufficient documentation

## 2013-11-01 DIAGNOSIS — Z87891 Personal history of nicotine dependence: Secondary | ICD-10-CM | POA: Diagnosis not present

## 2013-11-01 DIAGNOSIS — Z8659 Personal history of other mental and behavioral disorders: Secondary | ICD-10-CM | POA: Insufficient documentation

## 2013-11-01 DIAGNOSIS — N898 Other specified noninflammatory disorders of vagina: Secondary | ICD-10-CM | POA: Diagnosis not present

## 2013-11-01 NOTE — ED Notes (Signed)
Per Patient: Patient reports lower abdominal pain and vaginal bleeding. Symptoms have been present for greater than 1 week. Pt reports she has had associated dizziness. Pt Ax4, NAD. Reports pain is 9/10.

## 2013-11-02 ENCOUNTER — Emergency Department (HOSPITAL_COMMUNITY)
Admission: EM | Admit: 2013-11-02 | Discharge: 2013-11-02 | Disposition: A | Discharge status: Home / Self Care | Payer: Medicaid Other | Attending: Emergency Medicine | Admitting: Emergency Medicine

## 2013-11-02 DIAGNOSIS — N939 Abnormal uterine and vaginal bleeding, unspecified: Secondary | ICD-10-CM

## 2013-11-02 LAB — COMPREHENSIVE METABOLIC PANEL
ALBUMIN: 4.2 g/dL (ref 3.5–5.2)
ALK PHOS: 83 U/L (ref 39–117)
ALT: 19 U/L (ref 0–35)
AST: 15 U/L (ref 0–37)
Anion gap: 10 (ref 5–15)
BUN: 7 mg/dL (ref 6–23)
CALCIUM: 9 mg/dL (ref 8.4–10.5)
CO2: 25 mEq/L (ref 19–32)
Chloride: 103 mEq/L (ref 96–112)
Creatinine, Ser: 0.49 mg/dL — ABNORMAL LOW (ref 0.50–1.10)
GFR calc non Af Amer: >90 mL/min (ref >90)
GLUCOSE: 91 mg/dL (ref 70–99)
POTASSIUM: 4.6 meq/L (ref 3.7–5.3)
SODIUM: 138 meq/L (ref 137–147)
TOTAL PROTEIN: 7.5 g/dL (ref 6.0–8.3)
Total Bilirubin: <0.2 mg/dL — ABNORMAL LOW (ref 0.3–1.2)

## 2013-11-02 LAB — URINE MICROSCOPIC-ADD ON

## 2013-11-02 LAB — POC URINE PREG, ED: PREG TEST UR: NEGATIVE

## 2013-11-02 LAB — CBC WITH DIFFERENTIAL/PLATELET
BASOS ABS: 0 10*3/uL (ref 0.0–0.1)
BASOS PCT: 1 % (ref 0–1)
EOS ABS: 0.2 10*3/uL (ref 0.0–0.7)
EOS PCT: 2 % (ref 0–5)
HCT: 38.3 % (ref 36.0–46.0)
Hemoglobin: 12.9 g/dL (ref 12.0–15.0)
Lymphocytes Relative: 44 % (ref 12–46)
Lymphs Abs: 3.8 10*3/uL (ref 0.7–4.0)
MCH: 29.9 pg (ref 26.0–34.0)
MCHC: 33.7 g/dL (ref 30.0–36.0)
MCV: 88.9 fL (ref 78.0–100.0)
Monocytes Absolute: 0.5 10*3/uL (ref 0.1–1.0)
Monocytes Relative: 6 % (ref 3–12)
NEUTROS PCT: 47 % (ref 43–77)
Neutro Abs: 4.1 10*3/uL (ref 1.7–7.7)
PLATELETS: 282 10*3/uL (ref 150–400)
RBC: 4.31 MIL/uL (ref 3.87–5.11)
RDW: 12.8 % (ref 11.5–15.5)
WBC: 8.7 10*3/uL (ref 4.0–10.5)

## 2013-11-02 LAB — URINALYSIS, ROUTINE W REFLEX MICROSCOPIC
BILIRUBIN URINE: NEGATIVE
Glucose, UA: NEGATIVE mg/dL
Ketones, ur: NEGATIVE mg/dL
NITRITE: NEGATIVE
PROTEIN: NEGATIVE mg/dL
Specific Gravity, Urine: 1.005 (ref 1.005–1.030)
UROBILINOGEN UA: 0.2 mg/dL (ref 0.0–1.0)
pH: 6.5 (ref 5.0–8.0)

## 2013-11-02 LAB — WET PREP, GENITAL
CLUE CELLS WET PREP: NONE SEEN
TRICH WET PREP: NONE SEEN
YEAST WET PREP: NONE SEEN

## 2013-11-02 LAB — I-STAT TROPONIN, ED: TROPONIN I, POC: 0 ng/mL (ref 0.00–0.08)

## 2013-11-02 LAB — HIV ANTIBODY (ROUTINE TESTING W REFLEX): HIV 1&2 Ab, 4th Generation: NONREACTIVE

## 2013-11-02 LAB — LIPASE, BLOOD: Lipase: 39 U/L (ref 11–59)

## 2013-11-02 MED ORDER — HYDROCODONE-ACETAMINOPHEN 5-325 MG PO TABS
2.0000 | ORAL_TABLET | Freq: Once | ORAL | Status: AC
Start: 2013-11-02 — End: 2013-11-02
  Administered 2013-11-02: 2 via ORAL
  Filled 2013-11-02: qty 2

## 2013-11-02 MED ORDER — HYDROCODONE-ACETAMINOPHEN 5-325 MG PO TABS: 1.0000 | ORAL_TABLET | Freq: Two times a day (BID) | ORAL | Status: DC | PRN

## 2013-11-02 NOTE — Discharge Instructions (Signed)
Sangrado uterino anormal (Abnormal Uterine Bleeding) [name] Hale, you were seen today for vaginal bleeding for 1 month.  Your blood levels were ok and you do not need a transfusion.  You need to follow up with a gynecologist as soon as possible to continue evaluation of your bleeding.  If your symptoms worsen, or you get more lightheaded or dizzy, return to the ER IMMEDIATELY for evaluation of your blood levels.  Thank you  Sra. [name] Hale, que se ve hoy en da para el sangrado vaginal durante 1 mes. Sus niveles en sangre estaban bien y que no necesitan una transfusin. Es necesario hacer un seguimiento con un gineclogo lo antes posible para Community education officer evaluacin de su sangrado. Si sus sntomas empeoran o si se ponen ms aturdido o mareado, volver a la sala de emergencia inmediatamente para la evaluacin de sus Pharmacologist. Gracias   Sangrado uterino anormal significa que hay un sangrado por la vagina que no es su perodo menstrual normal. Puede ser:  Prdidas de sangre o hemorragias entre los perodos.  Hemorragias luego de Warehouse manager sexo [employer]).  Sangrado abundante o ms que lo habitual.  Perodos que duran ms que lo normal.  Sangrado luego de la menopausia. Hay muchos problemas que pueden ser la causa. El tratamiento depender de la causa del sangrado. Cualquier tipo de sangrado que no sea normal debe consultarse con el mdico.  CUIDADOS EN EL HOGAR Controle su afeccin para ver si hay cambios. Estas indicaciones podrn disminuir cualquier molestia que tenga:  No use tampones ni duchas vaginales o como le haya indicado el mdico.  Cambie los apsitos con frecuencia. Deber hacerse exmenes plvicos regulares y pruebas de Papanicolaou. Realice los estudios indicados segn le indique su mdico. SOLICITE AYUDA SI:  El sangrado dura ms de 1 semana.  Se siente mareada por momentos. SOLICITE AYUDA DE INMEDIATO SI:   Se desmaya.  Tiene que [employer] apsitos  cada 15 a 30 minutos.  Siente dolor en el abdomen.  Tiene fiebre.  Se siente dbil o presenta sudoracin.  Elimina cogulos grandes por la vagina.  Siente Programme researcher, broadcasting/film/video (nuseas) y devuelve (vomita). ASEGRESE DE QUE:  Comprende estas instrucciones.  Controlar su afeccin.  Recibir ayuda de inmediato si no mejora o si empeora. Document Released: 03/30/2010 Document Revised: 03/02/2013 [facility] Patient Information 2015 [place], [place]. This information is not intended to replace advice given to you by your health care provider. Make sure you discuss any questions you have with your health care provider.  Abnormal Uterine Bleeding Abnormal uterine bleeding means bleeding from the vagina that is not your normal menstrual period. This can be:  Bleeding or spotting between periods.  Bleeding after sex (sexual intercourse).  Bleeding that is heavier or more than normal.  Periods that last longer than usual.  Bleeding after menopause. There are many problems that may cause this. Treatment will depend on the cause of the bleeding. Any kind of bleeding that is not normal should be reviewed by your doctor.  HOME CARE Watch your condition for any changes. These actions may lessen any discomfort you are having:  Do not use tampons or douches as told by your doctor.  Change your pads often. You should get regular pelvic exams and Pap tests. Keep all appointments for tests as told by your doctor. GET HELP IF:  You are bleeding for more than 1 week.  You feel dizzy at times. GET HELP RIGHT AWAY IF:   You pass out.  You  have to change pads every 15 to 30 minutes.  You have belly pain.  You have a fever.  You become sweaty or weak.  You are passing large blood clots from the vagina.  You feel sick to your stomach (nauseous) and throw up (vomit). MAKE SURE YOU:  Understand these instructions.  Will watch your condition.  Will get help right away if you are not  doing well or get worse. Document Released: 12/23/2008 Document Revised: 03/02/2013 Document Reviewed: 09/24/2012 [facility] Patient Information 2015 [place], [place]. This information is not intended to replace advice given to you by your health care provider. Make sure you discuss any questions you have with your health care provider.  Emergency Department Resource Guide 1) Find a Doctor and Pay Out of Pocket Although you won't have to find out who is covered by your insurance plan, it is a good idea to ask around and get recommendations. You will then need to call the office and see if the doctor you have chosen will accept you as a new patient and what types of options they offer for patients who are self-pay. Some doctors offer discounts or will set up payment plans for their patients who do not have insurance, but you will need to ask so you aren't surprised when you get to your appointment.  2) Contact Your Local Health Department Not all health departments have doctors that can see patients for sick visits, but many do, so it is worth a call to see if yours does. If you don't know where your local health department is, you can check in your phone book. The CDC also has a tool to help you locate your state's health department, and many state websites also have listings of all of their local health departments.  3) Find a Walk-in Clinic If your illness is not likely to be very severe or complicated, you may want to try a walk in clinic. These are popping up all over the country in pharmacies, drugstores, and shopping centers. They're usually staffed by nurse practitioners or physician assistants that have been trained to treat common illnesses and complaints. They're usually fairly quick and inexpensive. However, if you have serious medical issues or chronic medical problems, these are probably not your best option.  No Primary Care Doctor: - Call Health Connect at  [phone] - they can help you  locate a primary care doctor that  accepts your insurance, provides certain services, etc. - Physician Referral [phone]   Medication Assistance: Organization         Address  Phone   Notes  [facility] Medication Assistance Program [address] [place] [place], [place] [address] [phone] --Must be a resident of [facility] -- Must have NO insurance coverage whatsoever (no Medicaid/ Medicare, etc.) -- The pt. MUST have a primary care doctor that directs their care regularly and follows them in the community   [facility]  [phone]   [employer]  [phone]    Agencies that provide inexpensive medical care: Organization         Address  Phone   Notes  [facility]  [phone]   [facility]    [phone]   [facility] [address] [place], [place] [address] [phone]   [facility] [address] [place] [place] [phone]   [facility]    [phone]   [facility]    [phone]   [facility]  [address], [place] Phone:  [phone], Fax:  [phone] Hours of Operation:  9 am - 6 pm, M-F.  Also accepts Medicaid/Medicare and self-pay.  [facility]  [address], [place] Phone: [phone], Fax: [phone]. Hours of Operation:  8:30 am - 5:30 pm, M-F.  Also accepts Medicaid and self-pay.  [facility] [address], [place] Phone: [phone]   [facility] [address] Natasha Bence [place], [place] [phone], Ext. [phone] Mondays & Thursdays: 7-9 AM.  First 15 patients are seen on a first come, first serve basis.
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
## 2013-11-03 LAB — GC/CHLAMYDIA PROBE AMP
CT PROBE, AMP APTIMA: NEGATIVE
GC Probe RNA: NEGATIVE

## 2013-11-08 ENCOUNTER — Telehealth: Payer: Self-pay

## 2013-11-08 ENCOUNTER — Ambulatory Visit (INDEPENDENT_AMBULATORY_CARE_PROVIDER_SITE_OTHER): Payer: Medicaid Other | Admitting: Obstetrics & Gynecology

## 2013-11-08 ENCOUNTER — Encounter: Payer: Self-pay | Admitting: Obstetrics & Gynecology

## 2013-11-08 VITALS — BP 100/70 | Ht <= 58 in | Wt 118.0 lb

## 2013-11-08 DIAGNOSIS — F32A Depression, unspecified: Secondary | ICD-10-CM

## 2013-11-08 DIAGNOSIS — Z3202 Encounter for pregnancy test, result negative: Secondary | ICD-10-CM

## 2013-11-08 DIAGNOSIS — F3289 Other specified depressive episodes: Secondary | ICD-10-CM

## 2013-11-08 DIAGNOSIS — N939 Abnormal uterine and vaginal bleeding, unspecified: Secondary | ICD-10-CM

## 2013-11-08 DIAGNOSIS — N926 Irregular menstruation, unspecified: Secondary | ICD-10-CM

## 2013-11-08 DIAGNOSIS — Z3009 Encounter for other general counseling and advice on contraception: Secondary | ICD-10-CM

## 2013-11-08 DIAGNOSIS — Z30011 Encounter for initial prescription of contraceptive pills: Secondary | ICD-10-CM

## 2013-11-08 DIAGNOSIS — F329 Major depressive disorder, single episode, unspecified: Secondary | ICD-10-CM

## 2013-11-08 LAB — POCT URINE PREGNANCY: Preg Test, Ur: NEGATIVE

## 2013-11-08 MED ORDER — NORETHIN ACE-ETH ESTRAD-FE 1-20 MG-MCG(24) PO CHEW: 1.0000 | CHEWABLE_TABLET | Freq: Every day | ORAL | Status: DC
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
## 2013-11-08 NOTE — Telephone Encounter (Signed)
-   [facility] called patient since she speaks little [place] and gave her phone number for [facility] they have a Spanish line and will be able to make her an appt - will check back with patient to see if she called them - [facility] does not have interpreter or bilingual counselors, [facility] is booked until December.

## 2013-11-22 ENCOUNTER — Ambulatory Visit (INDEPENDENT_AMBULATORY_CARE_PROVIDER_SITE_OTHER): Payer: Medicaid Other | Admitting: Obstetrics

## 2013-11-22 ENCOUNTER — Encounter: Payer: Self-pay | Admitting: Obstetrics & Gynecology

## 2013-11-22 VITALS — BP 105/64 | HR 82 | Temp 97.6°F | Wt 118.0 lb

## 2013-11-22 DIAGNOSIS — Z3009 Encounter for other general counseling and advice on contraception: Secondary | ICD-10-CM

## 2013-11-22 DIAGNOSIS — Z3046 Encounter for surveillance of implantable subdermal contraceptive: Secondary | ICD-10-CM

## 2013-11-22 NOTE — Progress Notes (Signed)
Patient is in the office to have the Nexplanon removed from her arm. Patient states it has been in place since 06/2012. Patient is using OCP to stop the BTB. Patient wants to continue on the pill after the Nexplanon is removed.

## 2013-11-24 ENCOUNTER — Encounter: Payer: Self-pay | Admitting: Obstetrics

## 2013-11-24 NOTE — Progress Notes (Signed)

## 2013-12-08 ENCOUNTER — Ambulatory Visit: Payer: Medicaid Other | Admitting: Obstetrics & Gynecology

## 2014-01-10 ENCOUNTER — Encounter: Payer: Self-pay | Admitting: Obstetrics

## 2014-03-07 ENCOUNTER — Encounter: Payer: Self-pay | Admitting: *Deleted

## 2014-03-08 ENCOUNTER — Encounter: Payer: Self-pay | Admitting: Obstetrics & Gynecology

## 2014-04-28 ENCOUNTER — Other Ambulatory Visit: Payer: Self-pay | Admitting: *Deleted

## 2014-04-28 MED ORDER — LEVONORGESTREL-ETHINYL ESTRAD 0.15-30 MG-MCG PO TABS: 1.0000 | ORAL_TABLET | Freq: Every day | ORAL | Status: DC

## 2014-09-16 ENCOUNTER — Encounter: Payer: Self-pay | Admitting: Certified Nurse Midwife

## 2014-09-16 ENCOUNTER — Ambulatory Visit (INDEPENDENT_AMBULATORY_CARE_PROVIDER_SITE_OTHER): Payer: Medicaid Other | Admitting: Certified Nurse Midwife

## 2014-09-16 VITALS — BP 98/66 | HR 75 | Temp 96.8°F | Ht <= 58 in | Wt 121.4 lb

## 2014-09-16 DIAGNOSIS — Z0000 Encounter for general adult medical examination without abnormal findings: Secondary | ICD-10-CM

## 2014-09-16 DIAGNOSIS — Z01419 Encounter for gynecological examination (general) (routine) without abnormal findings: Secondary | ICD-10-CM

## 2014-09-16 DIAGNOSIS — Z113 Encounter for screening for infections with a predominantly sexual mode of transmission: Secondary | ICD-10-CM

## 2014-09-16 DIAGNOSIS — L298 Other pruritus: Secondary | ICD-10-CM

## 2014-09-16 DIAGNOSIS — N898 Other specified noninflammatory disorders of vagina: Secondary | ICD-10-CM

## 2014-09-16 LAB — HEPATITIS C ANTIBODY: HCV Ab: NEGATIVE

## 2014-09-16 LAB — COMPREHENSIVE METABOLIC PANEL
ALBUMIN: 4.7 g/dL (ref 3.5–5.2)
ALK PHOS: 69 U/L (ref 39–117)
ALT: 27 U/L (ref 0–35)
AST: 18 U/L (ref 0–37)
BILIRUBIN TOTAL: 0.5 mg/dL (ref 0.2–1.2)
BUN: 6 mg/dL (ref 6–23)
CHLORIDE: 101 meq/L (ref 96–112)
CO2: 25 mEq/L (ref 19–32)
Calcium: 9.2 mg/dL (ref 8.4–10.5)
Creat: 0.5 mg/dL (ref 0.50–1.10)
GLUCOSE: 70 mg/dL (ref 70–99)
Potassium: 4.6 mEq/L (ref 3.5–5.3)
SODIUM: 139 meq/L (ref 135–145)
Total Protein: 7.9 g/dL (ref 6.0–8.3)

## 2014-09-16 LAB — CBC WITH DIFFERENTIAL/PLATELET
BASOS ABS: 0.1 10*3/uL (ref 0.0–0.1)
Basophils Relative: 1 % (ref 0–1)
Eosinophils Absolute: 0.2 10*3/uL (ref 0.0–0.7)
Eosinophils Relative: 2 % (ref 0–5)
HEMATOCRIT: 41.1 % (ref 36.0–46.0)
HEMOGLOBIN: 13.8 g/dL (ref 12.0–15.0)
LYMPHS ABS: 2.8 10*3/uL (ref 0.7–4.0)
LYMPHS PCT: 37 % (ref 12–46)
MCH: 30.5 pg (ref 26.0–34.0)
MCHC: 33.6 g/dL (ref 30.0–36.0)
MCV: 90.9 fL (ref 78.0–100.0)
MPV: 11.4 fL (ref 8.6–12.4)
Monocytes Absolute: 0.4 10*3/uL (ref 0.1–1.0)
Monocytes Relative: 5 % (ref 3–12)
NEUTROS ABS: 4.2 10*3/uL (ref 1.7–7.7)
NEUTROS PCT: 55 % (ref 43–77)
Platelets: 304 10*3/uL (ref 150–400)
RBC: 4.52 MIL/uL (ref 3.87–5.11)
RDW: 13.4 % (ref 11.5–15.5)
WBC: 7.7 10*3/uL (ref 4.0–10.5)

## 2014-09-16 LAB — HDL CHOLESTEROL: HDL: 50 mg/dL (ref >=46)

## 2014-09-16 LAB — CHOLESTEROL, TOTAL: CHOLESTEROL: 231 mg/dL — AB (ref 0–200)

## 2014-09-16 LAB — TRIGLYCERIDES: TRIGLYCERIDES: 111 mg/dL (ref <150)

## 2014-09-16 LAB — TSH: TSH: 3.588 u[IU]/mL (ref 0.350–4.500)

## 2014-09-16 LAB — HEPATITIS B SURFACE ANTIGEN: Hepatitis B Surface Ag: NEGATIVE

## 2014-09-16 MED ORDER — HYDROCORTISONE 1 % EX CREA
1.0000 | TOPICAL_CREAM | Freq: Two times a day (BID) | CUTANEOUS | Status: DC
Start: 2014-09-16 — End: 2014-12-14

## 2014-09-16 MED ORDER — VITAFOL-PLUS 27-1-200 MG PO CAPS: 1.0000 | ORAL_CAPSULE | Freq: Every day | ORAL | Status: DC
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
## 2014-09-16 NOTE — Addendum Note (Signed)
Addended by: Henriette CombsHATTON, Abygale Karpf L on: 09/16/2014 03:35 PM   Modules accepted: Orders

## 2014-09-17 LAB — RPR

## 2014-09-17 LAB — HIV ANTIBODY (ROUTINE TESTING W REFLEX): HIV 1&2 Ab, 4th Generation: NONREACTIVE

## 2014-09-20 LAB — PAP IG AND HPV HIGH-RISK: HPV DNA High Risk: NOT DETECTED

## 2014-09-21 LAB — SURESWAB, VAGINOSIS/VAGINITIS PLUS
Atopobium vaginae: NOT DETECTED Log (cells/mL)
C. GLABRATA, DNA: NOT DETECTED
C. PARAPSILOSIS, DNA: NOT DETECTED
C. TROPICALIS, DNA: NOT DETECTED
C. albicans, DNA: NOT DETECTED
C. trachomatis RNA, TMA: NOT DETECTED
LACTOBACILLUS SPECIES: NOT DETECTED Log (cells/mL)
MEGASPHAERA SPECIES: NOT DETECTED Log (cells/mL)
N. gonorrhoeae RNA, TMA: NOT DETECTED
T. VAGINALIS RNA, QL TMA: NOT DETECTED

## 2014-09-29 ENCOUNTER — Encounter: Payer: Self-pay | Admitting: *Deleted

## 2014-11-03 ENCOUNTER — Telehealth: Payer: Self-pay | Admitting: *Deleted

## 2014-11-03 NOTE — Telephone Encounter (Signed)
Lab letter returned- shredded.

## 2014-12-14 ENCOUNTER — Ambulatory Visit (INDEPENDENT_AMBULATORY_CARE_PROVIDER_SITE_OTHER): Payer: Medicaid Other | Admitting: Certified Nurse Midwife

## 2014-12-14 ENCOUNTER — Encounter: Payer: Self-pay | Admitting: Certified Nurse Midwife

## 2014-12-14 VITALS — BP 95/64 | HR 69 | Wt 126.0 lb

## 2014-12-14 DIAGNOSIS — N760 Acute vaginitis: Secondary | ICD-10-CM

## 2014-12-14 DIAGNOSIS — Z3202 Encounter for pregnancy test, result negative: Secondary | ICD-10-CM | POA: Diagnosis not present

## 2014-12-14 DIAGNOSIS — N939 Abnormal uterine and vaginal bleeding, unspecified: Secondary | ICD-10-CM | POA: Diagnosis not present

## 2014-12-14 DIAGNOSIS — R399 Unspecified symptoms and signs involving the genitourinary system: Secondary | ICD-10-CM | POA: Diagnosis not present

## 2014-12-14 DIAGNOSIS — L298 Other pruritus: Secondary | ICD-10-CM | POA: Diagnosis not present

## 2014-12-14 DIAGNOSIS — N898 Other specified noninflammatory disorders of vagina: Secondary | ICD-10-CM

## 2014-12-14 LAB — POCT URINALYSIS DIPSTICK
Bilirubin, UA: NEGATIVE
Blood, UA: NEGATIVE
GLUCOSE UA: NEGATIVE
Ketones, UA: NEGATIVE
Leukocytes, UA: NEGATIVE
NITRITE UA: NEGATIVE
Protein, UA: NEGATIVE
Urobilinogen, UA: NEGATIVE
pH, UA: 5

## 2014-12-14 LAB — POCT URINE PREGNANCY: Preg Test, Ur: NEGATIVE

## 2014-12-14 MED ORDER — TERCONAZOLE 0.4 % VA CREA: 1.0000 | TOPICAL_CREAM | Freq: Every day | VAGINAL | Status: DC

## 2014-12-14 MED ORDER — FLUCONAZOLE 100 MG PO TABS: 100.0000 mg | ORAL_TABLET | Freq: Once | ORAL | Status: DC

## 2014-12-14 MED ORDER — HYDROCORTISONE 1 % EX CREA: 1.0000 "application " | TOPICAL_CREAM | Freq: Two times a day (BID) | CUTANEOUS | Status: DC

## 2014-12-14 NOTE — Patient Instructions (Signed)
Levonorgestrel intrauterine device (IUD) Qu es este medicamento? El LEVONORGESTREL (DIU) es un dispositivo anticonceptivo (control de natalidad). El dispositivo se coloca dentro del tero por un profesional de la salud. Se utiliza para evitar el embarazo y tambin se puede utilizar para tratar el sangrado abundante que ocurre durante su perodo. Dependiendo del dispositivo, se puede utilizar por 3 a 5 aos. Este medicamento puede ser utilizado para otros usos; si tiene alguna pregunta consulte con su proveedor de atencin mdica o con su farmacutico. Qu le debo informar a mi profesional de la salud antes de tomar este medicamento? Necesita saber si usted presenta alguno de los siguientes problemas o situaciones: -exmen de Papanicolaou anormal -cncer de mama, cuello del tero o tero -diabetes -endometritis -si tiene una infeccin plvica o genital actual o en el pasado -tiene ms de una pareja sexual o si su pareja tiene ms de una pareja -enfermedad cardiaca -antecedente de embarazo tubrico o ectpico -problemas del sistema inmunolgico -DIU colocado -enfermedad heptica o tumor del hgado -problemas con la coagulacin o si toma diluyentes sanguneos -usa medicamentos intravenoso -forma inusual del tero -sangrado vaginal que no tiene explicacin -una reaccin alrgica o inusual al levonorgestrel, a otras hormonas, a la silicona o polietilenos, a otros medicamentos, alimentos, colorantes o conservantes -si est embarazada o buscando quedar embarazada -si est amamantando a un beb Cmo debo utilizar este medicamento? Un profesional de la salud coloca este dispositivo en el tero. Hable con su pediatra para informarse acerca del uso de este medicamento en nios. Puede requerir atencin especial. Sobredosis: Pngase en contacto inmediatamente con un centro toxicolgico o una sala de urgencia si usted cree que haya tomado demasiado medicamento. ATENCIN: Este medicamento es solo  para usted. No comparta este medicamento con nadie. Qu sucede si me olvido de una dosis? No se aplica en este caso. Qu puede interactuar con este medicamento? No tome esta medicina con ninguno de los siguientes medicamentos: -amprenavir -bosentano -fosamprenavir Esta medicina tambin puede interactuar con los siguientes medicamentos: -aprepitant -barbitricos para producir el sueo o para el tratamiento de convulsiones -bexaroteno -griseofulvina -medicamentos para tratar los convulsiones, tales como carbamazepina, etotona, felbamato, oxcarbazepina, fenitona, topiramato -modafinilo -pioglitazona -rifabutina -rifampicina -rifapentina -algunos medicamentos para tratar el virus VIH, tales como atazanavir, indinavir, lopinavir, nelfinavir, tipranavir, ritonavir -hierba de [place] -warfarina Puede ser que esta lista no menciona todas las posibles interacciones. Informe a su profesional de la salud de todos los productos a base de hierbas, medicamentos de venta libre o suplementos nutritivos que est tomando. Si usted fuma, consume bebidas alcohlicas o si utiliza drogas ilegales, indqueselo tambin a su profesional de la salud. Algunas sustancias pueden interactuar con su medicamento. A qu debo estar atento al usar este medicamento? Visite a su mdico o a su profesional de la salud para chequear su evolucin peridicamente. Visite a su mdico si usted o su pareja tiene relaciones sexuales con otras personas, se vuelve VIH positivo o contrae una enfermedad de transmisin sexual. Este medicamento no la protege de la infeccin por VIH (SIDA) ni de ninguna otra enfermedad de transmisin sexual. Puede controlar la ubicacin del DIU usted misma palpando con sus dedos limpios los hilos en la parte anterior de la vagina. No tire de los hilos. Es un buen hbito controlar la ubicacin del dispositivo despus de cada perodo menstrual. Si no slo siente los hilos sino que adems siente otra parte  ms del DIU o si no puede sentir los hilos, consulte a su mdico inmediatamente. El DIU puede salirse   por s solo. Puede quedar embarazada si el dispositivo se sale de su lugar. Utilice un mtodo anticonceptivo adicional, como preservativos, y consulte a su proveedor de atencin mdica s observa que el DIU se sali de su lugar. La utilizacin de tampones no cambia la posicin del DIU y no hay inconvenientes en usarlos durante su perodo. Qu efectos secundarios puedo tener al utilizar este medicamento? Efectos secundarios que debe informar a su mdico o a su profesional de la salud tan pronto como sea posible: -reacciones alrgicas como erupcin cutnea, picazn o urticarias, hinchazn de la cara, labios o lengua -fiebre, sntomas gripales -llagas genitales -alta presin sangunea -ausencia de un perodo menstrual durante 6 semanas mientras lo utiliza -dolor, hinchazn o calor en las piernas -dolor o sensibilidad del plvico -dolor de cabeza repentino o severo -signos de embarazo -calambres estomacales -falta de aliento repentina -problemas de coordinacin, del habla, al caminar -sangrado, flujo vaginal inusual -color amarillento de los ojos o la piel Efectos secundarios que, por lo general, no requieren atencin mdica (debe informarlos a su mdico o a su profesional de la salud si persisten o si son molestos): -acn -dolor de pecho -cambios en el deseo sexual o capacidad -cambios de peso -calambres, mareos o sensacin de desmayo mientras se introduce el dispositivo -dolor de cabeza -sangrado menstruales irregulares en los primeros 3 a 6 meses de usar -nuseas Puede ser que esta lista no menciona todos los posibles efectos secundarios. Comunquese a su mdico por asesoramiento mdico sobre los efectos secundarios. Usted puede informar los efectos secundarios a la FDA por telfono al 1-800-FDA-1088. Dnde debo guardar mi medicina? No se aplica en este caso. ATENCIN: Este folleto es  un resumen. Puede ser que no cubra toda la posible informacin. Si usted tiene preguntas acerca de esta medicina, consulte con su mdico, su farmacutico o su profesional de la salud.    2016, Elsevier/Gold Standard. (2014-04-19 00:00:00)  
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
## 2014-12-15 ENCOUNTER — Ambulatory Visit (INDEPENDENT_AMBULATORY_CARE_PROVIDER_SITE_OTHER): Payer: Medicaid Other

## 2014-12-15 DIAGNOSIS — N939 Abnormal uterine and vaginal bleeding, unspecified: Secondary | ICD-10-CM | POA: Diagnosis not present

## 2014-12-19 LAB — SURESWAB, VAGINOSIS/VAGINITIS PLUS
ATOPOBIUM VAGINAE: NOT DETECTED Log (cells/mL)
C. ALBICANS, DNA: NOT DETECTED
C. GLABRATA, DNA: NOT DETECTED
C. TRACHOMATIS RNA, TMA: NOT DETECTED
C. parapsilosis, DNA: NOT DETECTED
C. tropicalis, DNA: NOT DETECTED
GARDNERELLA VAGINALIS: 5 Log (cells/mL)
LACTOBACILLUS SPECIES: NOT DETECTED Log (cells/mL)
MEGASPHAERA SPECIES: NOT DETECTED Log (cells/mL)
N. gonorrhoeae RNA, TMA: NOT DETECTED
T. VAGINALIS RNA, QL TMA: NOT DETECTED

## 2015-01-25 ENCOUNTER — Telehealth: Payer: Self-pay | Admitting: *Deleted

## 2015-01-25 NOTE — Telephone Encounter (Signed)
Irving BurtonEmily, pt interpreter, called to office regarding pt results  Return call to DustinEmily, [phone], a 3 way call was placed in order to speak with pt.  Pt is inquiring about her u/s results.  Pt was made aware that she needs a f/u appt in order to review results with Rachelle and discuss further options.  Pt was scheduled for 02-01-15. Pt has no further concerns at this time.

## 2015-02-01 ENCOUNTER — Encounter: Payer: Self-pay | Admitting: Certified Nurse Midwife

## 2015-02-01 ENCOUNTER — Ambulatory Visit (INDEPENDENT_AMBULATORY_CARE_PROVIDER_SITE_OTHER): Payer: Medicaid Other | Admitting: Certified Nurse Midwife

## 2015-02-01 VITALS — BP 96/60 | HR 75 | Temp 97.9°F | Wt 130.0 lb

## 2015-02-01 DIAGNOSIS — Z91048 Other nonmedicinal substance allergy status: Secondary | ICD-10-CM | POA: Diagnosis not present

## 2015-02-01 DIAGNOSIS — Z9109 Other allergy status, other than to drugs and biological substances: Secondary | ICD-10-CM

## 2015-02-01 MED ORDER — FLUTICASONE PROPIONATE 50 MCG/ACT NA SUSP: 2.0000 | Freq: Every day | NASAL | Status: DC

## 2015-02-01 MED ORDER — LORATADINE 10 MG PO TABS: 10.0000 mg | ORAL_TABLET | Freq: Every day | ORAL | Status: DC
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
## 2015-04-17 ENCOUNTER — Other Ambulatory Visit (INDEPENDENT_AMBULATORY_CARE_PROVIDER_SITE_OTHER): Payer: Medicaid Other

## 2015-04-17 VITALS — BP 94/61 | HR 69 | Temp 97.8°F | Wt 128.0 lb

## 2015-04-17 DIAGNOSIS — Z3201 Encounter for pregnancy test, result positive: Secondary | ICD-10-CM

## 2015-04-17 DIAGNOSIS — N926 Irregular menstruation, unspecified: Secondary | ICD-10-CM

## 2015-04-17 LAB — POCT URINE PREGNANCY: Preg Test, Ur: POSITIVE — AB

## 2015-04-17 NOTE — Progress Notes (Unsigned)
Patient in office for a pregnancy confirmation. Pregnancy Test in office is positive. Patient encouraged to start prenatal vitamins and to schedule NOB appointment. Patient is having nausea and requesting a prescription for the nausea. Will forward request to the doctor.   BP 94/61 mmHg  Pulse 69  Temp(Src) 97.8 F (36.6 C)  Wt 128 lb (58.06 kg)  LMP 03/03/2015

## 2015-04-28 ENCOUNTER — Inpatient Hospital Stay (HOSPITAL_COMMUNITY)
Admission: AD | Admit: 2015-04-28 | Discharge: 2015-04-28 | Disposition: A | Discharge status: Home / Self Care | Payer: Medicaid Other | Source: Ambulatory Visit | Attending: Obstetrics | Admitting: Obstetrics

## 2015-04-28 ENCOUNTER — Emergency Department (HOSPITAL_COMMUNITY)
Admission: EM | Admit: 2015-04-28 | Discharge: 2015-04-28 | Discharge status: Absent without leave | Payer: Medicaid Other

## 2015-04-28 ENCOUNTER — Encounter (HOSPITAL_COMMUNITY): Payer: Self-pay | Admitting: Medical

## 2015-04-28 ENCOUNTER — Encounter: Payer: Self-pay | Admitting: Certified Nurse Midwife

## 2015-04-28 ENCOUNTER — Inpatient Hospital Stay (HOSPITAL_COMMUNITY): Payer: Medicaid Other

## 2015-04-28 ENCOUNTER — Ambulatory Visit (INDEPENDENT_AMBULATORY_CARE_PROVIDER_SITE_OTHER): Payer: Medicaid Other | Admitting: Certified Nurse Midwife

## 2015-04-28 VITALS — BP 90/60 | HR 73 | Wt 125.0 lb

## 2015-04-28 DIAGNOSIS — N939 Abnormal uterine and vaginal bleeding, unspecified: Secondary | ICD-10-CM | POA: Diagnosis not present

## 2015-04-28 DIAGNOSIS — N390 Urinary tract infection, site not specified: Secondary | ICD-10-CM

## 2015-04-28 DIAGNOSIS — Z3A08 8 weeks gestation of pregnancy: Secondary | ICD-10-CM | POA: Insufficient documentation

## 2015-04-28 DIAGNOSIS — Z91018 Allergy to other foods: Secondary | ICD-10-CM | POA: Diagnosis not present

## 2015-04-28 DIAGNOSIS — O468X1 Other antepartum hemorrhage, first trimester: Secondary | ICD-10-CM

## 2015-04-28 DIAGNOSIS — O418X10 Other specified disorders of amniotic fluid and membranes, first trimester, not applicable or unspecified: Secondary | ICD-10-CM

## 2015-04-28 DIAGNOSIS — R109 Unspecified abdominal pain: Secondary | ICD-10-CM | POA: Diagnosis not present

## 2015-04-28 DIAGNOSIS — O209 Hemorrhage in early pregnancy, unspecified: Secondary | ICD-10-CM | POA: Diagnosis present

## 2015-04-28 DIAGNOSIS — Z888 Allergy status to other drugs, medicaments and biological substances status: Secondary | ICD-10-CM | POA: Insufficient documentation

## 2015-04-28 DIAGNOSIS — O219 Vomiting of pregnancy, unspecified: Secondary | ICD-10-CM

## 2015-04-28 DIAGNOSIS — K219 Gastro-esophageal reflux disease without esophagitis: Secondary | ICD-10-CM

## 2015-04-28 DIAGNOSIS — Z881 Allergy status to other antibiotic agents status: Secondary | ICD-10-CM | POA: Diagnosis not present

## 2015-04-28 DIAGNOSIS — R110 Nausea: Secondary | ICD-10-CM | POA: Diagnosis not present

## 2015-04-28 LAB — CBC WITH DIFFERENTIAL/PLATELET
BASOS PCT: 0 %
Basophils Absolute: 0 10*3/uL (ref 0.0–0.1)
EOS ABS: 0.1 10*3/uL (ref 0.0–0.7)
EOS PCT: 1 %
HCT: 34.4 % — ABNORMAL LOW (ref 36.0–46.0)
HEMOGLOBIN: 11.8 g/dL — AB (ref 12.0–15.0)
LYMPHS ABS: 2.7 10*3/uL (ref 0.7–4.0)
Lymphocytes Relative: 30 %
MCH: 30.1 pg (ref 26.0–34.0)
MCHC: 34.3 g/dL (ref 30.0–36.0)
MCV: 87.8 fL (ref 78.0–100.0)
MONOS PCT: 4 %
Monocytes Absolute: 0.4 10*3/uL (ref 0.1–1.0)
NEUTROS PCT: 65 %
Neutro Abs: 6 10*3/uL (ref 1.7–7.7)
PLATELETS: 231 10*3/uL (ref 150–400)
RBC: 3.92 MIL/uL (ref 3.87–5.11)
RDW: 13.4 % (ref 11.5–15.5)
WBC: 9.2 10*3/uL (ref 4.0–10.5)

## 2015-04-28 LAB — HCG, QUANTITATIVE, PREGNANCY: HCG, BETA CHAIN, QUANT, S: 91947 m[IU]/mL — AB (ref <5)

## 2015-04-28 LAB — POCT URINALYSIS DIPSTICK
BILIRUBIN UA: NEGATIVE
Glucose, UA: NEGATIVE
Ketones, UA: NEGATIVE
NITRITE UA: POSITIVE
PH UA: 6
PROTEIN UA: NEGATIVE
Spec Grav, UA: 1.01
Urobilinogen, UA: NEGATIVE

## 2015-04-28 MED ORDER — PROMETHAZINE HCL 25 MG PO TABS: 25.0000 mg | ORAL_TABLET | Freq: Four times a day (QID) | ORAL | Status: DC | PRN

## 2015-04-28 MED ORDER — OMEPRAZOLE 20 MG PO CPDR: 20.0000 mg | DELAYED_RELEASE_CAPSULE | Freq: Two times a day (BID) | ORAL | Status: DC

## 2015-04-28 MED ORDER — NITROFURANTOIN MONOHYD MACRO 100 MG PO CAPS: 100.0000 mg | ORAL_CAPSULE | Freq: Two times a day (BID) | ORAL | Status: AC

## 2015-04-28 MED ORDER — PHENAZOPYRIDINE HCL 95 MG PO TABS: 95.0000 mg | ORAL_TABLET | Freq: Three times a day (TID) | ORAL | Status: DC | PRN

## 2015-04-28 NOTE — Discharge Instructions (Signed)
Hematoma subcorinico (Subchorionic Hematoma) Un hematoma subcorinico es una acumulacin de sangre entre la pared externa de la placenta y la pared interna del la matriz (tero). La placenta es el rgano que conecta el feto a la pared del tero. La placenta realiza la funcin de alimentacin, respiracin (oxgeno al feto) y el trabajo de eliminacin de desechos (excrecin) del feto.  Un hematoma subcorinico es la anormalidad ms frecuente encontrada en una ecografa durante el primer trimestre o principios del segundo trimestre del embarazo. Si ha habido poca o ninguna hemorragia vaginal, generalmente los pequeos hematomas se reducen por su propia cuenta y no afectan al beb ni al Vanetta Mulders. La sangre es absorbida gradualmente durante una o [place]. Cuando la hemorragia comienza ms tarde en el embarazo o el hematoma es ms grande o se produce en una paciente de edad avanzada, el resultado puede no ser tan bueno. Los grandes hematomas pueden agrandarse an ms y [place] las posibilidades de aborto espontneo. El hematoma subcorinico tambin aumenta el riesgo de desprendimiento precoz de la placenta del tero, muerte fetal y Sport and exercise psychologist. INSTRUCCIONES PARA EL CUIDADO EN EL HOGAR   Repose en cama si el mdico se lo recomienda. Aunque el reposo en cama no evitar la hemorragia o un aborto espontneo, su mdico puede recomendarlo.  Evite levantar objetos pesados (ms de 10 libras [4,5 kg]), hacer ejercicio, tener relaciones sexuales o realizar duchas vaginales segn se lo indique el profesional.  Lleve un registro de la cantidad y Hydrographic surveyor de remojo (saturacin) de las toallas higinicas que Landscape architect. [place] esta informacin.  No use tampones.  Cumpla con todas las visitas de control, segn le indique su mdico. El profesional podr pedirle que se realice anlisis de seguimiento, pruebas de [place] o [place]. SOLICITE ATENCIN MDICA DE INMEDIATO SI:   Siente calambres intensos en el  estmago, en la espalda, en el abdomen o en la pelvis.  Tiene fiebre.  Elimina cogulos o tejidos grandes. Guarde los tejidos para que su mdico los vea.  Si la hemorragia aumenta o siente mareos, debilidad o tiene episodios de [place].   Esta informacin no tiene Theme park manager el consejo del mdico. Asegrese de hacerle al mdico cualquier pregunta que tenga.   Document Released: 06/13/2008 Document Revised: 12/16/2012 Elsevier Interactive Patient Education 2016 [employer]. Plan de alimentacin para la hiperemesis gravdica (Eating Plan for Hyperemesis Gravidarum) Los casos graves de hiperemesis gravdica pueden derivar en deshidratacin y desnutricin. El plan de alimentacin para la hiperemesis es una forma de disminuir los sntomas de nuseas y vmitos. A menudo se utiliza con medicamentos recetados para controlar los sntomas.  QU PUEDO HACER PARA ALIVIAR MIS SNTOMAS? Prstele atencin a su cuerpo. Todas las personas son diferentes y Radiographer, therapeutic. Descubra qu funciona mejor para usted. Algunas de las siguientes opciones pueden ser de ayuda:  Coma y beba lentamente.  Realice 5 o 6comidas pequeas por da en vez de tres grandes.  Consuma galletitas saladas antes de levantarse por la maana.  Los alimentos que contienen almidn normalmente se toleran bien, como cereales, tostadas, pan, papas, pasta, arroz y pretzels.  El jengibre tambin es til para Automotive engineer las nuseas. Aada  de cucharadita de jengibre molido al t caliente o beba t de jengibre.  Intente tomar jugo 100% de frutas o una bebida de electrolitos.  Contine tomando las vitaminas prenatales segn las indicaciones del mdico. Si tiene problemas para tomas sus vitaminas prenatales, hable con su mdico sobre las diferentes opciones.  [place]  al menos una porcin de protenas con las comidas y las colaciones (como carne de vaca o de ave, frijoles, frutos secos, Regulatory affairs officer). Intente  comer una colacin rica en protenas antes de irse a dormir (como queso y [place]er o medio sndwich de [place] de man o pavo). QU DEBO EVITAR PARA REDUCIR MIS SNTOMAS? Las siguientes opciones pueden ser de ayuda para reducir sus sntomas:  Evite los alimentos con olores fuertes. Intente comer en reas bien ventiladas libres de olores.  Evite tomar agua u otras bebidas con las comidas. Intente no beber nada antes y despus de las comidas.  Evite beber ms de una taza de lquidos a la vez.  Evite las comidas fritas o con gran contenido de [place], como salsas con [place] y crema.  Evite los alimentos muy condimentados.  Evite saltear comidas tanto como sea posible. Las nuseas pueden ser ms intensas con el estmago vaco. Si no puede [employer] momento, no se obligue. Intente chupar trozos de hielo u otros elementos congelados e ingiera las caloras ms tarde.  Evite recostarse [employer] siguientes a comer.   Esta informacin no tiene Theme park manager el consejo del mdico. Asegrese de hacerle al mdico cualquier pregunta que tenga.   Document Released: 06/13/2008 Document Revised: 03/02/2013 Elsevier Interactive Patient Education [employer]
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
## 2015-04-28 NOTE — MAU Note (Addendum)
Patient presents with yellow vaginal discharge about 15 days ago, and on Wednesday started to have vaginal bleeding and was sent here because pregnant, abdominal pain, no bleeding today, was told she has UTI prescribed medications which she just picked up.

## 2015-04-29 LAB — RPR: RPR: NONREACTIVE

## 2015-04-29 LAB — HIV ANTIBODY (ROUTINE TESTING W REFLEX): HIV SCREEN 4TH GENERATION: NONREACTIVE

## 2015-04-30 LAB — CULTURE, OB URINE: Colony Count: >=100000

## 2015-05-02 ENCOUNTER — Other Ambulatory Visit: Payer: Self-pay | Admitting: Certified Nurse Midwife

## 2015-05-02 DIAGNOSIS — O2341 Unspecified infection of urinary tract in pregnancy, first trimester: Secondary | ICD-10-CM

## 2015-05-02 MED ORDER — AMOXICILLIN-POT CLAVULANATE 875-125 MG PO TABS: 1.0000 | ORAL_TABLET | Freq: Two times a day (BID) | ORAL | Status: AC

## 2015-05-04 LAB — SURESWAB, VAGINOSIS/VAGINITIS PLUS
Atopobium vaginae: NOT DETECTED Log (cells/mL)
C. ALBICANS, DNA: NOT DETECTED
C. GLABRATA, DNA: NOT DETECTED
C. PARAPSILOSIS, DNA: NOT DETECTED
C. TRACHOMATIS RNA, TMA: NOT DETECTED
C. TROPICALIS, DNA: NOT DETECTED
Gardnerella vaginalis: <4.7 Log (cells/mL)
LACTOBACILLUS SPECIES: NOT DETECTED Log (cells/mL)
MEGASPHAERA SPECIES: NOT DETECTED Log (cells/mL)
N. GONORRHOEAE RNA, TMA: NOT DETECTED
T. vaginalis RNA, QL TMA: NOT DETECTED

## 2015-05-23 ENCOUNTER — Ambulatory Visit (INDEPENDENT_AMBULATORY_CARE_PROVIDER_SITE_OTHER): Payer: Medicaid Other | Admitting: Certified Nurse Midwife

## 2015-05-23 ENCOUNTER — Encounter: Payer: Self-pay | Admitting: Certified Nurse Midwife

## 2015-05-23 VITALS — BP 93/66 | HR 72 | Temp 98.6°F | Wt 123.0 lb

## 2015-05-23 DIAGNOSIS — Z3481 Encounter for supervision of other normal pregnancy, first trimester: Secondary | ICD-10-CM | POA: Diagnosis not present

## 2015-05-23 DIAGNOSIS — Z3491 Encounter for supervision of normal pregnancy, unspecified, first trimester: Secondary | ICD-10-CM | POA: Insufficient documentation

## 2015-05-23 DIAGNOSIS — O219 Vomiting of pregnancy, unspecified: Secondary | ICD-10-CM | POA: Diagnosis not present

## 2015-05-23 LAB — POCT URINALYSIS DIPSTICK
BILIRUBIN UA: NEGATIVE
Blood, UA: NEGATIVE
Glucose, UA: NEGATIVE
Ketones, UA: NEGATIVE
LEUKOCYTES UA: NEGATIVE
NITRITE UA: NEGATIVE
PH UA: 7
PROTEIN UA: NEGATIVE
Spec Grav, UA: 1.015
Urobilinogen, UA: NEGATIVE

## 2015-05-23 MED ORDER — DOXYLAMINE-PYRIDOXINE 10-10 MG PO TBEC: DELAYED_RELEASE_TABLET | ORAL | Status: DC

## 2015-05-23 MED ORDER — PRENATE PIXIE 10-0.6-0.4-200 MG PO CAPS: 1.0000 | ORAL_CAPSULE | Freq: Every day | ORAL | Status: DC
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
## 2015-05-23 NOTE — Addendum Note (Signed)
Addended by: Henriette CombsHATTON, [place] Brasington L on: 05/23/2015 11:56 AM   Modules accepted: Orders

## 2015-05-25 LAB — PRENATAL PROFILE I(LABCORP)
ANTIBODY SCREEN: NEGATIVE
Basophils Absolute: 0 10*3/uL (ref 0.0–0.2)
Basos: 1 %
EOS (ABSOLUTE): 0.1 10*3/uL (ref 0.0–0.4)
Eos: 1 %
HEMOGLOBIN: 13.2 g/dL (ref 11.1–15.9)
HEP B S AG: NEGATIVE
Hematocrit: 39.1 % (ref 34.0–46.6)
IMMATURE GRANULOCYTES: 0 %
Immature Grans (Abs): 0 10*3/uL (ref 0.0–0.1)
LYMPHS ABS: 2.8 10*3/uL (ref 0.7–3.1)
Lymphs: 35 %
MCH: 30.3 pg (ref 26.6–33.0)
MCHC: 33.8 g/dL (ref 31.5–35.7)
MCV: 90 fL (ref 79–97)
MONOS ABS: 0.5 10*3/uL (ref 0.1–0.9)
Monocytes: 6 %
NEUTROS ABS: 4.6 10*3/uL (ref 1.4–7.0)
NEUTROS PCT: 57 %
Platelets: 246 10*3/uL (ref 150–379)
RBC: 4.36 x10E6/uL (ref 3.77–5.28)
RDW: 14.5 % (ref 12.3–15.4)
RH TYPE: POSITIVE
RPR: NONREACTIVE
RUBELLA: 2.41 {index} (ref >0.99)
WBC: 8 10*3/uL (ref 3.4–10.8)

## 2015-05-25 LAB — HEMOGLOBINOPATHY EVALUATION
HEMOGLOBIN A2 QUANTITATION: 2.6 % (ref 0.7–3.1)
HEMOGLOBIN F QUANTITATION: 0 % (ref 0.0–2.0)
HGB A: 97.4 % (ref 94.0–98.0)
HGB C: 0 %
HGB S: 0 %

## 2015-05-25 LAB — CULTURE, OB URINE

## 2015-05-25 LAB — VITAMIN D 25 HYDROXY (VIT D DEFICIENCY, FRACTURES): Vit D, 25-Hydroxy: 17.8 ng/mL — ABNORMAL LOW (ref 30.0–100.0)

## 2015-05-25 LAB — URINE CULTURE, OB REFLEX: Organism ID, Bacteria: NO GROWTH

## 2015-05-25 LAB — HIV ANTIBODY (ROUTINE TESTING W REFLEX): HIV Screen 4th Generation wRfx: NONREACTIVE

## 2015-05-25 LAB — VARICELLA ZOSTER ANTIBODY, IGG: Varicella zoster IgG: 830 index (ref >165)

## 2015-06-20 ENCOUNTER — Ambulatory Visit (INDEPENDENT_AMBULATORY_CARE_PROVIDER_SITE_OTHER): Payer: Medicaid Other | Admitting: Certified Nurse Midwife

## 2015-06-20 VITALS — BP 99/67 | HR 79 | Temp 98.1°F | Wt 125.0 lb

## 2015-06-20 DIAGNOSIS — Z3482 Encounter for supervision of other normal pregnancy, second trimester: Secondary | ICD-10-CM

## 2015-06-20 LAB — POCT URINALYSIS DIPSTICK
BILIRUBIN UA: NEGATIVE
GLUCOSE UA: NEGATIVE
Ketones, UA: NEGATIVE
LEUKOCYTES UA: NEGATIVE
NITRITE UA: NEGATIVE
PH UA: 6
PROTEIN UA: NEGATIVE
RBC UA: NEGATIVE
Spec Grav, UA: <=1.005
UROBILINOGEN UA: NEGATIVE

## 2015-06-20 MED ORDER — PRENATE PIXIE 10-0.6-0.4-200 MG PO CAPS: 1.0000 | ORAL_CAPSULE | Freq: Every day | ORAL | Status: DC

## 2015-06-20 NOTE — Progress Notes (Signed)
  Subjective:    [name] Hale is a 35 y.o. female being seen today for her obstetrical visit. She is at 11106w4d gestation. Patient reports: no complaints.  Here for exam with interpreter.    Problem List Items Addressed This Visit    None    Visit Diagnoses    Encounter for supervision of other normal pregnancy in second trimester    -  Primary    Relevant Medications    Prenat-FeAsp-Meth-FA-DHA w/o A (PRENATE PIXIE) 10-0.6-0.4-200 MG CAPS    Other Relevant Orders    POCT urinalysis dipstick (Completed)    US OB Comp + 14 Wk    AFP, Quad Screen      Patient Active Problem List   Diagnosis Date Noted  . Supervision of normal pregnancy in first trimester 05/23/2015  . Abnormal uterine bleeding (AUB) 12/14/2014  . 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 Overdose by acetaminophen 09/18/2011  . Overdose of opiate or related narcotic 09/18/2011  . Lethargy 09/18/2011  . Suicide attempt by drug ingestion (HCC) 09/18/2011  . Post traumatic stress disorder 02/20/2011  . Pelvic pain in female 01/21/2011    Objective:     BP 99/67 mmHg  Pulse 79  Temp(Src) 98.1 F (36.7 C)  Wt 125 lb (56.7 kg)  LMP 03/03/2015 Uterine Size: Below umbilicus   FHR; 153 by doppler  Assessment:    Pregnancy @ 33106w4d  weeks Doing well    Plan:    Problem list reviewed and updated. Labs reviewed.  Follow up in 4 weeks. FIRST/CF mutation testing/NIPT/QUAD SCREEN/fragile X/Ashkenazi Jewish population testing/Spinal muscular atrophy discussed: ordered. Role of ultrasound in pregnancy discussed; fetal survey: ordered. Amniocentesis discussed: not indicated. 50% of 15 minute visit spent on counseling and coordination of care.

## 2015-06-23 ENCOUNTER — Other Ambulatory Visit: Payer: Self-pay | Admitting: Certified Nurse Midwife

## 2015-06-23 DIAGNOSIS — R772 Abnormality of alphafetoprotein: Secondary | ICD-10-CM

## 2015-06-23 LAB — AFP, QUAD SCREEN
DIA Mom Value: 6.91
DIA VALUE (EIA): 1451.83 pg/mL
DSR (By Age)    1 IN: 436
DSR (SECOND TRIMESTER) 1 IN: 11
GESTATIONAL AGE AFP: 15.6 wk
MSAFP Mom: 0.53
MSAFP: 18.4 ng/mL
MSHCG Mom: 1.2
MSHCG: 62542 m[IU]/mL
Maternal Age At EDD: 33.1 YEARS
Osb Risk: 10000
PDF: 0
TEST RESULTS AFP: POSITIVE — AB
WEIGHT: 125 [lb_av]
uE3 Mom: 0.24
uE3 Value: 0.18 ng/mL

## 2015-06-27 ENCOUNTER — Ambulatory Visit (INDEPENDENT_AMBULATORY_CARE_PROVIDER_SITE_OTHER): Payer: Medicaid Other | Admitting: Certified Nurse Midwife

## 2015-06-27 DIAGNOSIS — R772 Abnormality of alphafetoprotein: Secondary | ICD-10-CM

## 2015-06-27 DIAGNOSIS — Z3482 Encounter for supervision of other normal pregnancy, second trimester: Secondary | ICD-10-CM

## 2015-06-27 NOTE — Progress Notes (Signed)
  Subjective:    [name] MeekerCatalina Hale is a 35 y.o. female being seen today for her obstetrical visit. She is at 8135w4d gestation. Patient reports: no complaints.  Problem List Items Addressed This Visit    None     Patient Active Problem List   Diagnosis Date Noted  . Supervision of normal pregnancy in first trimester 05/23/2015  . Abnormal uterine bleeding (AUB) 12/14/2014  . 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 Overdose by acetaminophen 09/18/2011  . Overdose of opiate or related narcotic 09/18/2011  . Lethargy 09/18/2011  . Suicide attempt by drug ingestion (HCC) 09/18/2011  . Post traumatic stress disorder 02/20/2011  . Pelvic pain in female 01/21/2011    Objective:     LMP 03/03/2015 Uterine Size: Below umbilicus     Assessment:    Pregnancy @ 2435w4d  weeks Doing well   Elevated AFP  Plan:    Problem list reviewed and updated. Labs reviewed.  Follow up in 4 weeks. FIRST/CF mutation testing/NIPT/QUAD SCREEN/fragile X/Ashkenazi Jewish population testing/Spinal muscular atrophy discussed: results reviewed. Role of ultrasound in pregnancy discussed; fetal survey: ordered. 100% of 15 minute visit spent on counseling and coordination of care.

## 2015-06-27 NOTE — Patient Instructions (Signed)
Alpha-Fetoprotein Test  WHY AM I HAVING THIS TEST?  This test is used to screen for birth defects, such as chromosomal abnormalities, neural tube defects, and body wall defects. It can also be used as a tumor marker for certain cancers.   Alpha-Fetoprotein (AFP) is a protein that is made by the fetal liver. Levels can be detected during pregnancy, starting at [redacted] weeks gestation and peaking at 16-[redacted] weeks gestation. Your health care provider may perform this test if you are pregnant or if a tumor is suspected.  WHAT KIND OF SAMPLE IS TAKEN?  A blood sample is required for this test. It is usually collected by inserting a needle into a vein.  HOW DO I PREPARE FOR THE TEST?  There is no preparation or fasting required for this test.  WHAT ARE THE REFERENCE RANGES?  References ranges are considered healthy ranges established after testing a large group of healthy people. Reference ranges may vary among different people, labs, and hospitals. It is your responsibility to obtain your test results. Ask the lab or department performing the test when and how you will get your results.  Reference ranges for AFP are the following:  · Adult: Less than 40 ng/mL or less than 40 mcg/L (SI units).  · Child younger than 1 year: Less than 30 ng/mL.  Ranges are stratified by weeks of gestation, and they vary among laboratories.  WHAT DO THE RESULTS MEAN?  Values above the reference ranges in pregnant women may indicate:  · Neural tube defects.  · Abdominal wall defects.  · Multiple pregnancy.  · Congenital abnormalities.  · Fetal distress or fetal death.  Values above the reference ranges in nonpregnant women may indicate:  · Reproductive cancers.  · Liver cancer.  · Liver cell death.  · Other types of cancer.  Values below the reference ranges in pregnant women may indicate:  · Down syndrome.  · Fetal death.  Talk with your health care provider to discuss your results, treatment options, and if necessary, the need for more tests. Talk  with your health care provider if you have any questions about your results.     This information is not intended to replace advice given to you by your health care provider. Make sure you discuss any questions you have with your health care provider.     Document Released: 03/21/2004 Document Revised: 03/18/2014 Document Reviewed: 07/30/2013  Elsevier Interactive Patient Education ©2016 [employer]

## 2015-06-28 ENCOUNTER — Encounter (HOSPITAL_COMMUNITY): Payer: Self-pay | Admitting: Certified Nurse Midwife

## 2015-07-10 DIAGNOSIS — O021 Missed abortion: Secondary | ICD-10-CM

## 2015-07-11 ENCOUNTER — Encounter (HOSPITAL_COMMUNITY): Payer: Self-pay

## 2015-07-11 ENCOUNTER — Other Ambulatory Visit: Payer: Self-pay | Admitting: Certified Nurse Midwife

## 2015-07-11 ENCOUNTER — Ambulatory Visit (HOSPITAL_COMMUNITY)
Admission: RE | Admit: 2015-07-11 | Discharge: 2015-07-11 | Disposition: A | Discharge status: Home / Self Care | Payer: Medicaid Other | Source: Ambulatory Visit | Attending: Certified Nurse Midwife | Admitting: Certified Nurse Midwife

## 2015-07-11 ENCOUNTER — Ambulatory Visit (HOSPITAL_COMMUNITY): Admission: RE | Admit: 2015-07-11 | Payer: Medicaid Other | Source: Ambulatory Visit

## 2015-07-11 DIAGNOSIS — O289 Unspecified abnormal findings on antenatal screening of mother: Secondary | ICD-10-CM | POA: Diagnosis not present

## 2015-07-11 DIAGNOSIS — R772 Abnormality of alphafetoprotein: Secondary | ICD-10-CM

## 2015-07-11 DIAGNOSIS — Z3A18 18 weeks gestation of pregnancy: Secondary | ICD-10-CM | POA: Diagnosis present

## 2015-07-11 DIAGNOSIS — Z36 Encounter for antenatal screening of mother: Secondary | ICD-10-CM | POA: Diagnosis present

## 2015-07-11 DIAGNOSIS — O34219 Maternal care for unspecified type scar from previous cesarean delivery: Secondary | ICD-10-CM | POA: Insufficient documentation

## 2015-07-11 DIAGNOSIS — Z1389 Encounter for screening for other disorder: Secondary | ICD-10-CM

## 2015-07-12 ENCOUNTER — Other Ambulatory Visit: Payer: Self-pay | Admitting: Certified Nurse Midwife

## 2015-07-13 ENCOUNTER — Other Ambulatory Visit: Payer: Medicaid Other

## 2015-07-18 ENCOUNTER — Encounter (HOSPITAL_COMMUNITY): Payer: Self-pay

## 2015-07-18 ENCOUNTER — Inpatient Hospital Stay (HOSPITAL_COMMUNITY)
Admission: AD | Admit: 2015-07-18 | Discharge: 2015-07-18 | Disposition: A | Discharge status: Other Acute Inpatient Hospital | Payer: Medicaid Other | Source: Ambulatory Visit | Attending: Obstetrics | Admitting: Obstetrics

## 2015-07-18 DIAGNOSIS — O021 Missed abortion: Secondary | ICD-10-CM

## 2015-07-18 DIAGNOSIS — O364XX0 Maternal care for intrauterine death, not applicable or unspecified: Secondary | ICD-10-CM | POA: Insufficient documentation

## 2015-07-18 DIAGNOSIS — IMO0002 Reserved for concepts with insufficient information to code with codable children: Secondary | ICD-10-CM

## 2015-07-18 DIAGNOSIS — Z3A19 19 weeks gestation of pregnancy: Secondary | ICD-10-CM | POA: Diagnosis not present

## 2015-07-18 HISTORY — DX: Missed abortion: O02.1

## 2015-07-18 LAB — POCT FERN TEST: POCT Fern Test: NEGATIVE

## 2015-07-18 MED ORDER — FENTANYL CITRATE (PF) 100 MCG/2ML IJ SOLN
INTRAMUSCULAR | Status: AC
  Filled 2015-07-18: qty 2

## 2015-07-18 MED ORDER — NALBUPHINE HCL 10 MG/ML IJ SOLN
10.0000 mg | Freq: Once | INTRAMUSCULAR | Status: AC
  Administered 2015-07-18: 10 mg via INTRAVENOUS
  Filled 2015-07-18: qty 1

## 2015-07-18 MED ORDER — SODIUM CHLORIDE 0.9 % IV SOLN
8.0000 mg | Freq: Once | INTRAVENOUS | Status: AC
  Administered 2015-07-18: 8 mg via INTRAVENOUS
  Filled 2015-07-18: qty 4

## 2015-07-18 MED ORDER — FENTANYL CITRATE (PF) 100 MCG/2ML IJ SOLN
50.0000 ug | Freq: Once | INTRAMUSCULAR | Status: AC
  Administered 2015-07-18: 50 ug via INTRAVENOUS

## 2015-07-18 MED ORDER — LACTATED RINGERS IV BOLUS (SEPSIS)
1000.0000 mL | Freq: Once | INTRAVENOUS | Status: AC
  Administered 2015-07-18: 1000 mL via INTRAVENOUS

## 2015-07-18 MED FILL — Hydromorphone HCl Inj 2 MG/ML: INTRAMUSCULAR | Qty: 1 | Status: AC

## 2015-07-18 NOTE — MAU Note (Signed)
Pt transferred via EMS with complaints of abdominal pain and vomiting that started around 0100. Denies vaginal bleeding. [facility]. Suppose to return [facility] this morning for ? Surgery. Pt actively vomiting in triage. States she took rx for pain medicine around 11pm and zofran at 0300. Is not helping. Has dx IUFD.
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
## 2015-07-18 NOTE — MAU Note (Signed)
Pt given 50mcg Fentanyl IV. [facility] at bedside to [facility].

## 2015-07-20 ENCOUNTER — Encounter: Payer: Medicaid Other | Admitting: Certified Nurse Midwife

## 2015-10-10 LAB — CYTOLOGY - PAP: Pap: NEGATIVE

## 2016-03-02 ENCOUNTER — Encounter (HOSPITAL_COMMUNITY): Payer: Self-pay

## 2016-05-28 ENCOUNTER — Ambulatory Visit (INDEPENDENT_AMBULATORY_CARE_PROVIDER_SITE_OTHER): Payer: Self-pay | Admitting: *Deleted

## 2016-05-28 DIAGNOSIS — Z3202 Encounter for pregnancy test, result negative: Secondary | ICD-10-CM

## 2016-05-28 DIAGNOSIS — N926 Irregular menstruation, unspecified: Secondary | ICD-10-CM

## 2016-05-28 LAB — POCT URINE PREGNANCY: PREG TEST UR: NEGATIVE

## 2016-05-28 NOTE — Progress Notes (Signed)
Pt is in office today for UPT.    UPT in office today is negative. Phone interpretation was used to translate with patient. Pt states that she was given injection to not get pregnant from health dept.  Pt states that it has been 2 months since injection.  Pt states that she has had little bleeding since then.  In talking with patient, she doesn't seem to understand the medication she was given or how the medication worked.  Explained in detail about birth control and how it typically works, if it was depo she was given.  Pt advised she may want to get records from health dept in order to have here in our office to add to her records. Pt states that she had a pregnancy lost last year and has not been seen here since. Pt was made aware that it may take some time for her to get pregnant due to birth control injection.  Pt made aware that if she was given depo it usually last for a 3 month timeframe.  Pt advised that she may still try for pregnancy, there is no way to know how long it may take. Pt advised that she is due for an annual exam and can discuss birth control and pregnancy concerns at that time.  Pt to be scheduled for appt at check out today.  Time spent with patient at today's visit: 30 minutes.

## 2016-06-17 ENCOUNTER — Ambulatory Visit: Payer: No Typology Code available for payment source | Admitting: Obstetrics and Gynecology

## 2016-06-17 ENCOUNTER — Encounter: Payer: Self-pay | Admitting: Obstetrics and Gynecology

## 2016-06-17 NOTE — Progress Notes (Signed)
Patient was not seen by a provider 

## 2016-06-17 NOTE — Progress Notes (Signed)
Patient states that she stopped Depo in jan 2018 and is wanting a consult to get pregnant.

## 2017-03-18 ENCOUNTER — Encounter: Payer: Self-pay | Admitting: *Deleted

## 2017-04-03 ENCOUNTER — Other Ambulatory Visit (HOSPITAL_COMMUNITY): Payer: Self-pay | Admitting: Nurse Practitioner

## 2017-04-03 DIAGNOSIS — Z3A13 13 weeks gestation of pregnancy: Secondary | ICD-10-CM

## 2017-04-03 DIAGNOSIS — Z3682 Encounter for antenatal screening for nuchal translucency: Secondary | ICD-10-CM

## 2017-04-03 DIAGNOSIS — O09521 Supervision of elderly multigravida, first trimester: Secondary | ICD-10-CM

## 2017-04-03 LAB — OB RESULTS CONSOLE RPR: RPR: NONREACTIVE

## 2017-04-03 LAB — OB RESULTS CONSOLE GC/CHLAMYDIA
Chlamydia: NEGATIVE
Gonorrhea: NEGATIVE

## 2017-04-03 LAB — OB RESULTS CONSOLE PLATELET COUNT: Platelets: 240

## 2017-04-03 LAB — OB RESULTS CONSOLE VARICELLA ZOSTER ANTIBODY, IGG: VARICELLA IGG: IMMUNE

## 2017-04-08 ENCOUNTER — Encounter (HOSPITAL_COMMUNITY): Payer: Self-pay | Admitting: *Deleted

## 2017-04-08 ENCOUNTER — Other Ambulatory Visit: Payer: Self-pay | Admitting: Family Medicine

## 2017-04-10 ENCOUNTER — Other Ambulatory Visit (HOSPITAL_COMMUNITY): Payer: Self-pay | Admitting: Nurse Practitioner

## 2017-04-10 ENCOUNTER — Ambulatory Visit (HOSPITAL_COMMUNITY)
Admission: RE | Admit: 2017-04-10 | Discharge: 2017-04-10 | Disposition: A | Discharge status: Home / Self Care | Payer: Medicaid Other | Source: Ambulatory Visit | Attending: Nurse Practitioner | Admitting: Nurse Practitioner

## 2017-04-10 ENCOUNTER — Other Ambulatory Visit (HOSPITAL_COMMUNITY): Payer: Self-pay | Admitting: *Deleted

## 2017-04-10 ENCOUNTER — Encounter (HOSPITAL_COMMUNITY): Payer: Self-pay

## 2017-04-10 DIAGNOSIS — O09521 Supervision of elderly multigravida, first trimester: Secondary | ICD-10-CM

## 2017-04-10 DIAGNOSIS — Z3A13 13 weeks gestation of pregnancy: Secondary | ICD-10-CM | POA: Insufficient documentation

## 2017-04-10 DIAGNOSIS — O34219 Maternal care for unspecified type scar from previous cesarean delivery: Secondary | ICD-10-CM

## 2017-04-10 DIAGNOSIS — Z3A14 14 weeks gestation of pregnancy: Secondary | ICD-10-CM

## 2017-04-10 DIAGNOSIS — O09529 Supervision of elderly multigravida, unspecified trimester: Secondary | ICD-10-CM | POA: Insufficient documentation

## 2017-04-10 DIAGNOSIS — Z3682 Encounter for antenatal screening for nuchal translucency: Secondary | ICD-10-CM

## 2017-04-10 DIAGNOSIS — Z8759 Personal history of other complications of pregnancy, childbirth and the puerperium: Secondary | ICD-10-CM

## 2017-04-10 NOTE — Progress Notes (Signed)
Genetic Counseling  High-Risk Gestation Note  Appointment Date:  04/10/2017 Referred By: [name], Margareta, NP Date of Birth:  [birth year]   Pregnancy History: [id number] Estimated Date of Delivery: 10/16/17 Estimated Gestational Age: 1029w0d Attending: Particia NearingMartha Decker, MD   [name] Needle was seen for genetic counseling because of a maternal age of 37 years old at delivery. Telephonic interpreter [phone] and then [employer] video interpreter [phone] provided Spanish/English medical interpretation for today's visit.     In summary:  Discussed AMA and associated risk for fetal aneuploidy  Discussed options for screening  First screen  Quad screen  NIPS- elected to pursue Panorama today   Ultrasound- performed today, see separate report; anatomy scheduled 05/08/17  Discussed diagnostic testing options- declined  [facility]  Amniocentesis  Reviewed family history concerns  She was counseled regarding maternal age and the association with risk for chromosome conditions due to nondisjunction with aging of the ova.   We reviewed chromosomes, nondisjunction, and the associated 1 in 114 risk for fetal aneuploidy related to a maternal age of 37 y.o. at 7829w0d gestation.  She was counseled that the risk for aneuploidy decreases as gestational age increases, accounting for those pregnancies which spontaneously abort.  We specifically discussed Down syndrome (trisomy 3921), trisomies 3613 and 2518, and sex chromosome aneuploidies (47,XXX and 47,XXY) including the common features and prognoses of each.   We reviewed available screening options including First Screen, Quad screen, noninvasive prenatal screening (NIPS)/cell free DNA (cfDNA) screening, and detailed ultrasound.  She was counseled that screening tests are used to modify a patient's a priori risk for aneuploidy, typically based on age. This estimate provides a pregnancy specific risk assessment. We reviewed the benefits and limitations of each  option. Specifically, we discussed the conditions for which each test screens, the detection rates, and false positive rates of each. She was also counseled regarding diagnostic testing via [facility] and amniocentesis. We reviewed the approximate 1 in 300-500 risk for complications from amniocentesis, including spontaneous pregnancy loss. We discussed the possible results that the tests might provide including: positive, negative, unanticipated, and no result. Finally, they were counseled regarding the cost of each option and potential out of pocket expenses.   After consideration of all the options, she elected to proceed with NIPS ([facility]  Those results will be available in 8-10 days.  She declined maternal serum screening and declined amniocentesis.   An ultrasound was performed today.  The report will be documented separately.  A detailed ultrasound was scheduled for 05/08/17. She understands that screening tests cannot rule out all birth defects or genetic syndromes. The patient was advised of this limitation and states she still does not want additional testing at this time.   Both family histories were reviewed and found to be noncontributory for birth defects, intellectual disability, and known genetic conditions. Without further information regarding the provided family history, an accurate genetic risk cannot be calculated. Further genetic counseling is warranted if more information is obtained.  [name] Osoria denied exposure to environmental toxins or chemical agents. She denied the use of alcohol, tobacco or street drugs. She denied significant viral illnesses during the course of her pregnancy.  I counseled [name] regarding the above risks and available options.  The approximate face-to-face time with the genetic counselor was 40 minutes.  Quinn PlowmanKaren Evianna Chandran, MS,  Certified Genetic Counselor 04/10/2017

## 2017-04-15 ENCOUNTER — Encounter: Payer: Self-pay | Admitting: *Deleted

## 2017-04-17 ENCOUNTER — Other Ambulatory Visit: Payer: Self-pay

## 2017-04-17 ENCOUNTER — Encounter: Payer: Self-pay | Admitting: Student

## 2017-04-17 ENCOUNTER — Ambulatory Visit (INDEPENDENT_AMBULATORY_CARE_PROVIDER_SITE_OTHER): Payer: Medicaid Other | Admitting: Student

## 2017-04-17 ENCOUNTER — Other Ambulatory Visit (HOSPITAL_COMMUNITY)
Admission: RE | Admit: 2017-04-17 | Discharge: 2017-04-17 | Disposition: A | Discharge status: Home / Self Care | Payer: Medicaid Other | Source: Ambulatory Visit | Attending: Student | Admitting: Student

## 2017-04-17 VITALS — BP 102/62 | HR 86 | Wt 126.8 lb

## 2017-04-17 DIAGNOSIS — Z3480 Encounter for supervision of other normal pregnancy, unspecified trimester: Secondary | ICD-10-CM | POA: Insufficient documentation

## 2017-04-17 DIAGNOSIS — O09292 Supervision of pregnancy with other poor reproductive or obstetric history, second trimester: Secondary | ICD-10-CM

## 2017-04-17 DIAGNOSIS — Z98891 History of uterine scar from previous surgery: Secondary | ICD-10-CM

## 2017-04-17 DIAGNOSIS — Z87898 Personal history of other specified conditions: Secondary | ICD-10-CM

## 2017-04-17 DIAGNOSIS — O34219 Maternal care for unspecified type scar from previous cesarean delivery: Secondary | ICD-10-CM | POA: Diagnosis not present

## 2017-04-17 LAB — POCT URINALYSIS DIP (DEVICE)
BILIRUBIN URINE: NEGATIVE
Glucose, UA: NEGATIVE mg/dL
HGB URINE DIPSTICK: NEGATIVE
Ketones, ur: NEGATIVE mg/dL
NITRITE: NEGATIVE
Protein, ur: NEGATIVE mg/dL
SPECIFIC GRAVITY, URINE: 1.015 (ref 1.005–1.030)
Urobilinogen, UA: 0.2 mg/dL (ref 0.0–1.0)
pH: 7 (ref 5.0–8.0)

## 2017-04-17 NOTE — Patient Instructions (Addendum)
Crecimiento del beb durante el embarazo (How a Baby Grows During Pregnancy) El embarazo comienza cuando el semen de un hombre ingresa al vulo de una mujer (fecundacin). Esto ocurre en una de las trompas de Falopio que conecta los ovarios con el tero. Al vulo fecundado se lo denomina embrin hasta que alcanza las 10semanas. A partir de las 10semanas y hasta el momento del parto, se llama feto. El vulo fecundado se desplaza por la trompa de Falopio hasta llegar al tero y luego se implanta en el endometrio y empieza crecer. El feto en crecimiento recibe oxgeno y nutrientes a travs del torrente sanguneo de la embarazada y de los tejidos que se forman (placenta) para la sustentacin fetal. La placenta es el sistema de sustentacin de la vida del feto, proporciona la nutricin y elimina los desechos. Informarse tanto como pueda sobre el embarazo y la forma en que se desarrolla el beb puede ayudarla a disfrutar de la experiencia, y, adems, a que se d cuenta de cundo puede haber un problema y cundo hacer preguntas. CUNTO DURA UN EMBARAZO NORMAL? Generalmente, el embarazo dura 280das, o unas 40semanas. Se divide tres trimestres:  Primer trimestre: desde la semana0 a la13.  Segundo trimestre: desde la semana14 a la27.  Tercer trimestre: desde la semana28 a la40. El da que se considera que el beb est listo para nacer (a trmino) es la fecha prevista de parto. CMO SE DESARROLLA EL BEB MES A MES? Primer mes  El vulo fecundado se implanta dentro del tero.  Algunas clulas formarn la placenta, y otras formarn el feto.  Empiezan a desarrollarse los brazos, las piernas, la mdula espinal, los pulmones y el corazn.  Al final del primer mes, el corazn comienza a latir. Segundo mes  Se forman los huesos, el odo interno, los prpados, las manos y los pies.  Se desarrollan los genitales.  Al final de las 8semanas, todos los rganos importantes estn en  desarrollo. Tercer mes  Se estn formando todos los rganos internos.  Se forman los dientes debajo de las encas.  Empiezan a crecer los huesos y los msculos. La columna vertebral tiene movimiento de flexin.  La piel es transparente.  Empiezan a formarse las uas de las manos y de los pies.  Los brazos y las piernas siguen alargndose, y se desarrollan las manos y los pies.  El feto mide aproximadamente 3pulgadas (7,6cm) de largo. Cuarto mes  La placenta est totalmente formada.  Se han formado los rganos sexuales externos, el cuello, las orejas, las cejas, los prpados y las uas de las manos.  El feto puede or, tragar y mover los brazos y las piernas.  Los riones empiezan a producir orina.  La piel est recubierta por una sustancia sebcea blanca (unto sebceo) y un vello muy fino (lanugo). Quinto mes  El feto se mueve ms y es posible sentirlo por primera vez (da pataditas).  Empieza a dormir y despertarse, y tal vez comience a chuparse el dedo.  Crecen las uas en las puntas de los dedos.  Funciona el rgano del sistema digestivo que produce bilis (vescula biliar) y ayuda a digerir los nutrientes.  Si el beb es nia, tiene vulos en los ovarios. Si el beb es varn, los testculos empiezan a descender hasta el escroto. Sexto mes  Se han formado los pulmones, pero el feto an no puede respirar.  Los ojos se abren. El cerebro sigue desarrollndose.  El beb tiene huellas en los dedos de las manos y   los pies. El cabello del beb se vuelve ms abundante.  A fines del segundo trimestre, el feto mide aproximadamente 9pulgadas (22,9cm) de largo. Sptimo mes  El feto patea y se estira.  Los ojos se han desarrollado lo suficiente como para percibir los cambios de luz.  Las manos pueden hacer movimientos de prensin.  El feto responde a los ruidos. Octavo mes  Todos los rganos, as como los sistemas y aparatos del organismo, estn totalmente  desarrollados y en funcionamiento.  Los huesos se solidifican, y se desarrollan los botones gustativos. Es posible que el feto tenga hipo.  Determinadas regiones del cerebro an se estn desarrollando. El crneo sigue siendo blando. Noveno mes  El feto aumenta aproximadamente libra (230g) cada semana.  Los pulmones estn totalmente desarrollados.  Se desarrollan los hbitos de sueo.  Generalmente, el feto se acomoda con la cabeza hacia abajo (presentacin ceflica de vrtice) en el tero para prepararse para el parto. En cambio, si los glteos se acomodan en esta posicin, el beb est de nalgas.  El feto pesa entre 6 y 9libras (2,72 y 4,08kg) y mide entre 19 y 20pulgadas (48,26 a 50,8cm) de largo. QU PUEDO HACER PARA QUE EL EMBARAZO SEA SANO Y PARA AYUDAR AL BEB A DESARROLLARSE? Comida y bebida  Consuma una dieta saludable. ? Hable con el mdico para asegurarse de que est recibiendo los nutrientes que usted y el beb necesitan. ? Visite www.choosemyplate.gov para obtener ms informacin sobre cmo crear una dieta saludable.  El mdico le aconsejar cul es la cantidad saludable de peso a aumentar durante el embarazo, por lo general, entre 25 y 35libras (11 y 16kg). Puede ser necesario que: ? Aumente ms si tena bajo peso antes de quedar embarazada o si est embarazada de ms de un beb. ? Aumente menos si tena sobrepeso u obesidad cuando qued embarazada. Medicamentos y vitaminas  Tome las vitaminas prenatales como se lo haya indicado el mdico, entre ellas, cido flico, hierro, calcio y vitaminaD, que son importantes para el desarrollo saludable.  Tome los medicamentos solamente como se lo haya indicado el mdico. Lea las etiquetas y consulte al farmacutico o al mdico si puede tomar medicamentos de venta libre, suplementos y medicamentos recetados durante el embarazo. Actividades  Haga actividad fsica como se lo haya aconsejado el mdico. Pdale al mdico que  le recomiende actividades que sean seguras para usted, como caminar o practicar natacin.  No participe en deportes extremos ni extenuantes. Estilo de vida  No beba alcohol.  No consuma ningn producto que contenga tabaco, lo que incluye cigarrillos, tabaco de mascar o cigarrillos electrnicos. Si necesita ayuda para dejar de fumar, consulte al mdico.  No consuma drogas. Seguridad  No se exponga al mercurio, al plomo ni a otros metales pesados. Pregntele al mdico acerca de las fuentes comunes de estos metales pesados.  Evite la infeccin por listeria durante el embarazo. Tome las siguientes precauciones: ? No coma quesos blandos ni fiambres. ? No coma perros calientes, salvo que hayan sido calentados al punto de emitir vapor, por ejemplo, en el microondas. ? No tome leche no pasteurizada.  Evite la infeccin por toxoplasmosis durante el embarazo. Tome las siguientes precauciones: ? No cambie la arena sanitaria del gato, si tiene uno. Pdale a otra persona que lo haga por usted. ? Use guantes de jardinera mientras trabaja en el jardn. Instrucciones generales  Concurra a todas las visitas de control como se lo haya indicado el mdico. Esto es importante. Estas incluyen las visitas   de cuidado prenatal y las pruebas de deteccin.  Mantenga las enfermedades crnicas bajo control. Trabaje en estrecha colaboracin con el mdico para mantener las enfermedades bajo control, por ejemplo, la diabetes. CMO S SI EL BEB SE EST DESARROLLANDO BIEN? En cada visita de cuidado prenatal, el mdico har varios estudios diferentes para controlar su estado de salud y hacer un seguimiento del desarrollo del beb. Estos incluyen los siguientes:  Altura uterina. ? El mdico le medir el vientre en crecimiento desde la parte superior a la inferior con una cinta mtrica. ? Adems, le palpar el vientre para determinar la posicin del beb.  Latido cardaco. ? Una ecografa realizada en el primer  trimestre puede confirmar el embarazo y mostrar un latido cardaco, dependiendo del tiempo de gestacin. ? El mdico controlar la frecuencia cardaca del beb en cada visita de cuidado prenatal. ? A medida que se aproxima la fecha de parto, tal vez se hagan controles habituales de la frecuencia cardaca para garantizar que no haya sufrimiento fetal.  Ecografa del segundo trimestre. ? Esta ecografa controla el desarrollo del beb y tambin indica su sexo. QU DEBO HACER SI TENGO ALGUNA INQUIETUD RESPECTO DEL DESARROLLO DEL BEB? Hable siempre con el mdico si tiene alguna inquietud. Esta informacin no tiene como fin reemplazar el consejo del mdico. Asegrese de hacerle al mdico cualquier pregunta que tenga. Document Released: 08/14/2007 Document Revised: 06/19/2015 Document Reviewed: 08/04/2013 Elsevier Interactive Patient Education  2018 Elsevier Inc.  

## 2017-04-17 NOTE — Progress Notes (Addendum)
  Subjective:    [name] Hale is being seen today for her first obstetrical visit.  This is a planned pregnancy. She is at 27w3dgestation. Her obstetrical history is significant for previous c-section and postpartum depression.  as well as SAB at 18 weeks. [facility]. Relationship with FOB: spouse, living together. Patient does intend to breast feed. Pregnancy history fully reviewed. Patient is anxious abotu fetal movements after her loss at 18 weeks.   Patient reports no complaints. She is confused about her medicaid card; she does not know what kind of BC her medicaid will cover.   Review of Systems:   Review of Systems  Constitutional: Negative.   HENT: Negative.   Respiratory: Negative.   Cardiovascular: Negative.   Gastrointestinal: Negative.   Genitourinary: Negative.   Neurological: Negative.   Hematological: Negative.   Psychiatric/Behavioral: Negative.     Objective:     BP 102/62   Pulse 86   Wt 126 lb 12.8 oz (57.5 kg)   LMP 01/09/2017 (LMP Unknown)   BMI 27.44 kg/m  Physical Exam  Constitutional: She is oriented to person, place, and time. She appears well-developed.  HENT:  Head: Normocephalic.  Eyes: Pupils are equal, round, and reactive to light.  Neck: Normal range of motion.  Respiratory: Effort normal.  GI: Soft.  Musculoskeletal: Normal range of motion.  Neurological: She is alert and oriented to person, place, and time. She has normal reflexes.  Skin: Skin is warm and dry.  Psychiatric: She has a normal mood and affect.    Exam    Assessment:    Pregnancy: [id number] Active Problem List   Diagnosis Date Noted  . Supervision of other normal pregnancy, antepartum 04/17/2017  . Advanced maternal age in multigravida, unspecified trimester 04/10/2017  . [redacted] weeks gestation of pregnancy   . Abnormal uterine bleeding (AUB) 12/14/2014  . 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  Overdose by acetaminophen 09/18/2011  . Overdose of opiate or related narcotic ([facility] 09/18/2011  . Lethargy 09/18/2011  . Suicide attempt by drug ingestion ([facility] 09/18/2011  . Post traumatic stress disorder 02/20/2011  . Pelvic pain in female 01/21/2011       Plan:     Initial labs drawn (some from HD have already been drawn; will do the rest today).  Prenatal vitamins. Problem list reviewed and updated. AFP3 discussed: pending. . -Has already met with genetic counselor.  Role of ultrasound in pregnancy discussed; fetal survey: ordered. Amniocentesis discussed: not indicated. Follow up in 4 weeks. 75% of 349m visit spent on counseling and coordination of care.  -Patient desires repeat c-section; she would like a tubal if medicaid will pay for it.  -Oriented patient to practice -Reviewed normal fetal movements and that babies don't move much at 15 weeks. She should not worry if she doesn't feel the baby move every day, but if she is ever concerned she should visit the MAU or call for an appt.  -All questions answered.  KaMervyn SkeetersoRe[facility]/09/2017

## 2017-04-18 ENCOUNTER — Encounter: Payer: Self-pay | Admitting: *Deleted

## 2017-04-18 LAB — HIV ANTIBODY (ROUTINE TESTING W REFLEX): HIV SCREEN 4TH GENERATION: NONREACTIVE

## 2017-04-18 LAB — GC/CHLAMYDIA PROBE AMP (~~LOC~~) NOT AT ARMC
CHLAMYDIA, DNA PROBE: NEGATIVE
Neisseria Gonorrhea: NEGATIVE

## 2017-04-18 LAB — RUBELLA SCREEN: Rubella Antibodies, IGG: 1.57 index (ref >0.99)

## 2017-04-18 LAB — HEPATITIS B SURFACE ANTIGEN: HEP B S AG: NEGATIVE

## 2017-04-22 ENCOUNTER — Other Ambulatory Visit: Payer: Self-pay | Admitting: Student

## 2017-04-22 DIAGNOSIS — O2342 Unspecified infection of urinary tract in pregnancy, second trimester: Secondary | ICD-10-CM | POA: Insufficient documentation

## 2017-04-22 LAB — URINE CULTURE, OB REFLEX

## 2017-04-22 LAB — CULTURE, OB URINE

## 2017-04-22 MED ORDER — CEPHALEXIN 500 MG PO CAPS: 500.0000 mg | ORAL_CAPSULE | Freq: Four times a day (QID) | ORAL | 0 refills | Status: DC

## 2017-04-24 ENCOUNTER — Telehealth: Payer: Self-pay | Admitting: Student

## 2017-04-24 NOTE — Telephone Encounter (Signed)
Spoke with patient; reviewed urine results with her and treatment plan. Patient verbalized understanding; will pick up her RX today.   [name] KitchensKathryn Jonatha Hale

## 2017-04-25 ENCOUNTER — Telehealth: Payer: Self-pay | Admitting: General Practice

## 2017-04-25 NOTE — Telephone Encounter (Signed)
Patient called into front office with a question. Elane FritzBlanca used for interpreter. Patient states she was recently told she had an infection and picked up her prescription at the pharmacy, which was keflex 500mg  QID- patient is wondering if this is correct because that seems like a high dose. Told patient that is the correct dose and medication. Patient asked how long she is to take the medicine and if she can drive on the medication. Told patient she is to take it for 1 week and yes she can drive, because it's only an antibiotic. Patient verbalized understanding & had no questions

## 2017-05-08 ENCOUNTER — Other Ambulatory Visit (HOSPITAL_COMMUNITY): Payer: Self-pay | Admitting: Maternal and Fetal Medicine

## 2017-05-08 ENCOUNTER — Ambulatory Visit (HOSPITAL_COMMUNITY)
Admission: RE | Admit: 2017-05-08 | Discharge: 2017-05-08 | Disposition: A | Discharge status: Home / Self Care | Payer: Medicaid Other | Source: Ambulatory Visit | Attending: Nurse Practitioner | Admitting: Nurse Practitioner

## 2017-05-08 DIAGNOSIS — O09522 Supervision of elderly multigravida, second trimester: Secondary | ICD-10-CM

## 2017-05-08 DIAGNOSIS — Z363 Encounter for antenatal screening for malformations: Secondary | ICD-10-CM | POA: Diagnosis not present

## 2017-05-08 DIAGNOSIS — Z3A18 18 weeks gestation of pregnancy: Secondary | ICD-10-CM

## 2017-05-08 DIAGNOSIS — O09292 Supervision of pregnancy with other poor reproductive or obstetric history, second trimester: Secondary | ICD-10-CM | POA: Insufficient documentation

## 2017-05-08 DIAGNOSIS — O09529 Supervision of elderly multigravida, unspecified trimester: Secondary | ICD-10-CM

## 2017-05-08 DIAGNOSIS — O34219 Maternal care for unspecified type scar from previous cesarean delivery: Secondary | ICD-10-CM | POA: Insufficient documentation

## 2017-05-08 DIAGNOSIS — O09299 Supervision of pregnancy with other poor reproductive or obstetric history, unspecified trimester: Secondary | ICD-10-CM

## 2017-05-16 ENCOUNTER — Institutional Professional Consult (permissible substitution): Payer: Medicaid Other

## 2017-05-16 ENCOUNTER — Ambulatory Visit (INDEPENDENT_AMBULATORY_CARE_PROVIDER_SITE_OTHER): Payer: Medicaid Other | Admitting: Medical

## 2017-05-16 ENCOUNTER — Encounter: Payer: Self-pay | Admitting: Medical

## 2017-05-16 VITALS — BP 102/60 | HR 71 | Wt 133.7 lb

## 2017-05-16 DIAGNOSIS — O34219 Maternal care for unspecified type scar from previous cesarean delivery: Secondary | ICD-10-CM | POA: Diagnosis not present

## 2017-05-16 DIAGNOSIS — Z3482 Encounter for supervision of other normal pregnancy, second trimester: Secondary | ICD-10-CM | POA: Diagnosis not present

## 2017-05-16 DIAGNOSIS — Z3480 Encounter for supervision of other normal pregnancy, unspecified trimester: Secondary | ICD-10-CM

## 2017-05-16 DIAGNOSIS — F4310 Post-traumatic stress disorder, unspecified: Secondary | ICD-10-CM

## 2017-05-16 DIAGNOSIS — Z23 Encounter for immunization: Secondary | ICD-10-CM | POA: Diagnosis not present

## 2017-05-16 DIAGNOSIS — N898 Other specified noninflammatory disorders of vagina: Secondary | ICD-10-CM

## 2017-05-16 DIAGNOSIS — Z98891 History of uterine scar from previous surgery: Secondary | ICD-10-CM

## 2017-05-16 MED ORDER — NYSTATIN 100000 UNIT/GM EX CREA: TOPICAL_CREAM | CUTANEOUS | 0 refills | Status: DC

## 2017-05-16 MED ORDER — PRENATAL VITAMINS 0.8 MG PO TABS: 1.0000 | ORAL_TABLET | Freq: Every day | ORAL | 12 refills | Status: DC

## 2017-05-16 NOTE — Patient Instructions (Signed)
Second Trimester of Pregnancy The second trimester is from week 13 through week 28, month 4 through 6. This is often the time in pregnancy that you feel your best. Often times, morning sickness has lessened or quit. You may have more energy, and you may get hungry more often. Your unborn baby (fetus) is growing rapidly. At the end of the sixth month, he or she is about 9 inches long and weighs about 1 pounds. You will likely feel the baby move (quickening) between 18 and 20 weeks of pregnancy. Follow these instructions at home:  Avoid all smoking, herbs, and alcohol. Avoid drugs not approved by your doctor.  Do not use any tobacco products, including cigarettes, chewing tobacco, and electronic cigarettes. If you need help quitting, ask your doctor. You may get counseling or other support to help you quit.  Only take medicine as told by your doctor. Some medicines are safe and some are not during pregnancy.  Exercise only as told by your doctor. Stop exercising if you start having cramps.  Eat regular, healthy meals.  Wear a good support bra if your breasts are tender.  Do not use hot tubs, steam rooms, or saunas.  Wear your seat belt when driving.  Avoid raw meat, uncooked cheese, and liter boxes and soil used by cats.  Take your prenatal vitamins.  Take 1500-2000 milligrams of calcium daily starting at the 20th week of pregnancy until you deliver your baby.  Try taking medicine that helps you poop (stool softener) as needed, and if your doctor approves. Eat more fiber by eating fresh fruit, vegetables, and whole grains. Drink enough fluids to keep your pee (urine) clear or pale yellow.  Take warm water baths (sitz baths) to soothe pain or discomfort caused by hemorrhoids. Use hemorrhoid cream if your doctor approves.  If you have puffy, bulging veins (varicose veins), wear support hose. Raise (elevate) your feet for 15 minutes, 3-4 times a day. Limit salt in your diet.  Avoid heavy  lifting, wear low heals, and sit up straight.  Rest with your legs raised if you have leg cramps or low back pain.  Visit your dentist if you have not gone during your pregnancy. Use a soft toothbrush to brush your teeth. Be gentle when you floss.  You can have sex (intercourse) unless your doctor tells you not to.  Go to your doctor visits. Get help if:  You feel dizzy.  You have mild cramps or pressure in your lower belly (abdomen).  You have a nagging pain in your belly area.  You continue to feel sick to your stomach (nauseous), throw up (vomit), or have watery poop (diarrhea).  You have bad smelling fluid coming from your vagina.  You have pain with peeing (urination). Get help right away if:  You have a fever.  You are leaking fluid from your vagina.  You have spotting or bleeding from your vagina.  You have severe belly cramping or pain.  You lose or gain weight rapidly.  You have trouble catching your breath and have chest pain.  You notice sudden or extreme puffiness (swelling) of your face, hands, ankles, feet, or legs.  You have not felt the baby move in over an hour.  You have severe headaches that do not go away with medicine.  You have vision changes. This information is not intended to replace advice given to you by your health care provider. Make sure you discuss any questions you have with your health care   provider. Document Released: 05/22/2009 Document Revised: 08/03/2015 Document Reviewed: 04/28/2012 Elsevier Interactive Patient Education  2017 Elsevier Inc.  
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
## 2017-05-23 ENCOUNTER — Inpatient Hospital Stay (HOSPITAL_COMMUNITY)
Admission: AD | Admit: 2017-05-23 | Discharge: 2017-05-23 | Disposition: A | Discharge status: Home / Self Care | Payer: Medicaid Other | Source: Ambulatory Visit | Attending: Obstetrics & Gynecology | Admitting: Obstetrics & Gynecology

## 2017-05-23 ENCOUNTER — Encounter (HOSPITAL_COMMUNITY): Payer: Self-pay

## 2017-05-23 DIAGNOSIS — O9989 Other specified diseases and conditions complicating pregnancy, childbirth and the puerperium: Secondary | ICD-10-CM | POA: Diagnosis not present

## 2017-05-23 DIAGNOSIS — Z9889 Other specified postprocedural states: Secondary | ICD-10-CM | POA: Diagnosis not present

## 2017-05-23 DIAGNOSIS — O26812 Pregnancy related exhaustion and fatigue, second trimester: Secondary | ICD-10-CM | POA: Insufficient documentation

## 2017-05-23 DIAGNOSIS — Z888 Allergy status to other drugs, medicaments and biological substances status: Secondary | ICD-10-CM | POA: Insufficient documentation

## 2017-05-23 DIAGNOSIS — O3680X0 Pregnancy with inconclusive fetal viability, not applicable or unspecified: Secondary | ICD-10-CM | POA: Insufficient documentation

## 2017-05-23 DIAGNOSIS — R51 Headache: Secondary | ICD-10-CM | POA: Diagnosis present

## 2017-05-23 DIAGNOSIS — Z3A20 20 weeks gestation of pregnancy: Secondary | ICD-10-CM | POA: Diagnosis not present

## 2017-05-23 DIAGNOSIS — Z3480 Encounter for supervision of other normal pregnancy, unspecified trimester: Secondary | ICD-10-CM

## 2017-05-23 DIAGNOSIS — Z881 Allergy status to other antibiotic agents status: Secondary | ICD-10-CM | POA: Insufficient documentation

## 2017-05-23 DIAGNOSIS — Z91018 Allergy to other foods: Secondary | ICD-10-CM | POA: Insufficient documentation

## 2017-05-23 DIAGNOSIS — O09522 Supervision of elderly multigravida, second trimester: Secondary | ICD-10-CM | POA: Diagnosis not present

## 2017-05-23 DIAGNOSIS — R5383 Other fatigue: Secondary | ICD-10-CM | POA: Diagnosis present

## 2017-05-23 DIAGNOSIS — O09529 Supervision of elderly multigravida, unspecified trimester: Secondary | ICD-10-CM

## 2017-05-23 DIAGNOSIS — Z3492 Encounter for supervision of normal pregnancy, unspecified, second trimester: Secondary | ICD-10-CM

## 2017-05-23 NOTE — MAU Note (Signed)
Pt reports one week ago she got the flu shot and started feeling tired, not eating. Also repots headaches since that time. States she is feeling very little fetal movement

## 2017-05-23 NOTE — Discharge Instructions (Signed)
Segundo trimestre de embarazo (Second Trimester of Pregnancy) El segundo trimestre va desde la semana13 hasta la 28, desde el cuarto hasta el sexto mes, y suele ser el momento en el que mejor se siente. En general, las nuseas matutinas han disminuido o han desaparecido completamente. Tendr ms energa y podr aumentarle el apetito. El beb por nacer (feto) se desarrolla rpidamente. Hacia el final del sexto mes, el beb mide aproximadamente 9 pulgadas (23 cm) y pesa alrededor de 1 libras (700 g). Es probable que sienta al beb moverse (dar pataditas) entre las 18 y 20 semanas del embarazo. CUIDADOS EN EL HOGAR  No fume, no consuma hierbas ni beba alcohol. No tome frmacos que el mdico no haya autorizado.  No consuma ningn producto que contenga tabaco, lo que incluye cigarrillos, tabaco de mascar o cigarrillos electrnicos. Si necesita ayuda para dejar de fumar, consulte al mdico. Puede recibir asesoramiento u otro tipo de apoyo para dejar de fumar.  Tome los medicamentos solamente como se lo haya indicado el mdico. Algunos medicamentos son seguros para tomar durante el embarazo y otros no lo son.  Haga ejercicios solamente como se lo haya indicado el mdico. Interrumpa la actividad fsica si comienza a tener calambres.  Ingiera alimentos saludables de manera regular.  Use un sostn que le brinde buen soporte si sus mamas estn sensibles.  No se d baos de inmersin en agua caliente, baos turcos ni saunas.  Colquese el cinturn de seguridad cuando conduzca.  No coma carne cruda ni queso sin cocinar; evite el contacto con las bandejas sanitarias de los gatos y la tierra que estos animales usan.  Tome las vitaminas prenatales.  Tome entre 1500 y 2000mg de calcio diariamente comenzando en la semana20 del embarazo hasta el parto.  Pruebe tomar un medicamento que la ayude a defecar (un laxante suave) si el mdico lo autoriza. Consuma ms fibra, que se encuentra en las frutas y  verduras frescas y los cereales integrales. Beba suficiente lquido para mantener el pis (orina) claro o de color amarillo plido.  Dese baos de asiento con agua tibia para aliviar el dolor o las molestias causadas por las hemorroides. Use una crema para las hemorroides si el mdico la autoriza.  Si se le hinchan las venas (venas varicosas), use medias de descanso. Levante (eleve) los pies durante 15minutos, 3 o 4veces por da. Limite el consumo de sal en su dieta.  No levante objetos pesados, use zapatos de tacones bajos y sintese derecha.  Descanse con las piernas elevadas si tiene calambres o dolor de cintura.  Visite a su dentista si no lo ha hecho durante el embarazo. Use un cepillo de cerdas suaves para cepillarse los dientes. Psese el hilo dental con suavidad.  Puede seguir manteniendo relaciones sexuales, a menos que el mdico le indique lo contrario.  Concurra a los controles mdicos.  SOLICITE AYUDA SI:  Siente mareos.  Sufre calambres o presin leves en la parte baja del vientre (abdomen).  Sufre un dolor persistente en el abdomen.  Tiene malestar estomacal (nuseas), vmitos, o tiene deposiciones acuosas (diarrea).  Advierte un olor ftido que proviene de la vagina.  Siente dolor al orinar.  SOLICITE AYUDA DE INMEDIATO SI:  Tiene fiebre.  Tiene una prdida de lquido por la vagina.  Tiene sangrado o pequeas prdidas vaginales.  Siente dolor intenso o clicos en el abdomen.  Sube o baja de peso rpidamente.  Tiene dificultades para recuperar el aliento y siente dolor en el pecho.  Sbitamente se   le hinchan mucho el rostro, las manos, los tobillos, los pies o las piernas.  No ha sentido los movimientos del beb durante una hora.  Siente un dolor de cabeza intenso que no se alivia con medicamentos.  Su visin se modifica.  Esta informacin no tiene como fin reemplazar el consejo del mdico. Asegrese de hacerle al mdico cualquier pregunta que  tenga. Document Released: 10/28/2012 Document Revised: 03/18/2014 Document Reviewed: 04/28/2012 Elsevier Interactive Patient Education  2017 Elsevier Inc.  

## 2017-05-23 NOTE — MAU Note (Signed)
Urine sent to lab 
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
## 2017-06-11 ENCOUNTER — Encounter: Payer: Self-pay | Admitting: Student

## 2017-06-12 ENCOUNTER — Ambulatory Visit (INDEPENDENT_AMBULATORY_CARE_PROVIDER_SITE_OTHER): Payer: Medicaid Other | Admitting: Student

## 2017-06-12 VITALS — BP 101/60 | HR 92 | Wt 140.1 lb

## 2017-06-12 DIAGNOSIS — O2342 Unspecified infection of urinary tract in pregnancy, second trimester: Secondary | ICD-10-CM

## 2017-06-12 DIAGNOSIS — Z3480 Encounter for supervision of other normal pregnancy, unspecified trimester: Secondary | ICD-10-CM

## 2017-06-12 DIAGNOSIS — Z789 Other specified health status: Secondary | ICD-10-CM
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
## 2017-06-12 NOTE — Progress Notes (Signed)
Video Interpreter # [id number]

## 2017-06-12 NOTE — Patient Instructions (Signed)
Segundo trimestre de embarazo  Second Trimester of Pregnancy  El segundo trimestre va desde la semana 14 hasta la 27, desde el cuarto hasta el sexto mes, y suele ser el momento en el que mejor se siente. Su organismo se ha adaptado a estar embarazada, y comienza a sentirse físicamente mejor. En general, las náuseas matutinas han disminuido o han desaparecido completamente, puede tener más energía y un aumento de apetito. El segundo trimestre es también la época en la que el feto se desarrolla rápidamente. Hacia el final del sexto mes, el feto mide aproximadamente 9 pulgadas (23 cm) y pesa alrededor de 1½ libras (700 g). Es probable que sienta que el bebé se mueve (da pataditas) entre las 16 y 20 semanas del embarazo.  Cambios en el cuerpo durante el segundo trimestre  Su cuerpo continua experimentando numerosos cambios durante su segundo trimestre. Estos cambios varían de una mujer a otra.  · Seguirá aumentando de peso. Notará que la parte baja del abdomen sobresale.  · Podrán aparecer las primeras estrías en las caderas, el abdomen y las mamas.  · Es posible que tenga dolores de cabeza que pueden aliviarse con ciertos medicamentos. Los medicamentos que tome deben estar aprobados por el médico.  · Tal vez tenga necesidad de orinar con más frecuencia porque el feto está ejerciendo presión sobre la vejiga.  · Debido al embarazo podrá sentir acidez estomacal con frecuencia.  · Puede estar estreñida, ya que ciertas hormonas enlentecen los movimientos de los músculos que empujan los desechos a través de los intestinos.  · Pueden aparecer hemorroides o abultarse e hincharse las venas (venas varicosas).  · Puede sentir dolor en la espalda. Esto se debe a:  ? Aumento de peso.  ? Las hormonas del embarazo relajan las articulaciones en la pelvis.  ? Un cambio en el peso y los músculos que ayudan a mantener su equilibrio.  · Sus pechos seguirán creciendo y se pondrán cada vez más sensibles.   · Las encías pueden sangrar y estar sensibles al cepillado y al hilo dental.  · Pueden aparecer zonas oscuras o manchas (cloasma, máscara del embarazo) en el rostro. Esto probablemente se atenuará después del nacimiento del bebé.  · Es posible que se forme una línea oscura desde el ombligo hasta la zona del pubis (linea nigra). Esto probablemente se atenuará después del nacimiento del bebé.  · Tal vez haya cambios en el cabello. Esto cambios pueden incluir su engrosamiento, crecimiento rápido y cambios en la textura. Además, a algunas mujeres se les cae el cabello durante o después del embarazo, o tienen el cabello seco o fino. Lo más probable es que el cabello se le normalice después del nacimiento del bebé.    Qué debe esperar en las visitas prenatales  Durante una visita prenatal de rutina:  · La pesarán para asegurarse de que usted y el feto están creciendo normalmente.  · Le tomarán la presión arterial.  · Le medirán el abdomen para controlar el desarrollo del bebé.  · Se escucharán los latidos cardíacos fetales.  · Se evaluarán los resultados de los estudios solicitados en visitas anteriores.    El médico puede preguntarle lo siguiente:  · Cómo se siente.  · Si siente los movimientos del bebé.  · Si ha tenido síntomas anormales, como pérdida de líquido, sangrado, dolores de cabeza intensos o cólicos abdominales.  · Si está consumiendo algún producto que contenga tabaco, como cigarrillos, tabaco de mascar y cigarrillos electrónicos.  · Si tiene alguna pregunta.    Otros   estudios que podrán realizarse durante el segundo trimestre incluyen lo siguiente:  · Análisis de sangre para detectar lo siguiente:  ? Concentraciones de hierro bajas (anemia).  ? Nivel alto de azúcar en la sangre que afecta a las mujeres embarazadas (diabetes gestacional) entre las semanas 24 y 28.  ? Anticuerpos Rh. Esto es para detectar una proteína en los glóbulos rojos (factor Rh).   · Análisis de orina para detectar infecciones, diabetes o proteínas en la orina.  · Una ecografía para confirmar que el bebé crece y se desarrolla correctamente.  · Una amniocentesis para diagnosticar posibles problemas genéticos.  · Estudios del feto para descartar espina bífida y síndrome de Down.  · Prueba del VIH (virus de inmunodeficiencia humana). Los exámenes prenatales de rutina incluyen la prueba de detección del VIH, a menos que decida no realizársela.    Siga estas indicaciones en su casa:  Medicamentos  · Siga las indicaciones del médico en relación con el uso de medicamentos. Durante el embarazo, hay medicamentos que pueden tomarse y otros que no.  · Tome vitaminas prenatales que contengan por lo menos 600 microgramos (?g) de ácido fólico.  · Si está estreñida, tome un laxante suave, si el médico lo autoriza.  Qué debe comer y beber  · Lleve una dieta equilibrada que incluya gran cantidad de frutas y verduras frescas, cereales integrales, buenas fuentes de proteínas como carnes magras, huevos o tofu, y lácteos descremados. El médico la ayudará a determinar la cantidad de peso que puede aumentar.  · No coma carne cruda ni quesos sin cocinar. Estos elementos contienen gérmenes que pueden causar defectos congénitos en el bebé.  · Si no consume muchos alimentos con calcio, hable con su médico sobre si debería tomar un suplemento diario de calcio.  · Limite el consumo de alimentos con alto contenido de grasas y azúcares procesados, como alimentos fritos o dulces.  · Para evitar el estreñimiento:  ? Bebe suficiente líquido para mantener la orina clara o de color amarillo pálido.  ? Consuma alimentos ricos en fibra, como frutas y verduras frescas, cereales integrales y frijoles.  Actividad  · Haga ejercicio solamente como se lo haya indicado el médico. La mayoría de las mujeres pueden continuar su rutina de ejercicios durante el embarazo. Intente realizar como mínimo 30 minutos de actividad física por  lo menos 5 días a la semana. Deje de hacer ejercicio si experimenta contracciones uterinas.  · No levante objetos pesados, use zapatos de tacones bajos y mantenga una buena postura.  · Puede seguir manteniendo relaciones sexuales, a menos que el médico le indique lo contrario.  Alivio del dolor y del malestar  · Use un sostén que le brinde buen soporte para prevenir las molestias causadas por la sensibilidad en los pechos.  · Dese baños de asiento con agua tibia para aliviar el dolor o las molestias causadas por las hemorroides. Use una crema para las hemorroides si el médico la autoriza.  · Descanse con las piernas elevadas si tiene calambres o dolor de cintura.  · Si tiene venas varicosas, use medias de descanso. Eleve los pies durante 15 minutos, 3 o 4 veces por día. Limite el consumo de sal en su dieta.  Cuidados prenatales  · Escriba sus preguntas. Llévelas cuando concurra a las visitas prenatales.  · Concurra a todas las visitas prenatales tal como se lo haya indicado el médico. Esto es importante.  Seguridad  · Use el cinturón de seguridad en todo momento mientras conduce.  · Haga una lista de los   números de teléfono de emergencia, que incluya los números de teléfono de familiares, amigos, el hospital y los departamentos de policía y bomberos.  Instrucciones generales  · Pídale al médico que la derive a clases de educación prenatal en su localidad. Debe comenzar a tomar las clases antes de que empiece el mes 6 de embarazo.  · Pida ayuda si tiene necesidades nutricionales o de asesoramiento durante el embarazo. El médico puede aconsejarla o derivarla a especialistas para que la ayuden con diferentes necesidades.  · No se dé baños de inmersión en agua caliente, baños turcos ni saunas.  · No se haga duchas vaginales ni use tampones o toallas higiénicas perfumadas.  · No mantenga las piernas cruzadas durante mucho tiempo.  · Evite el contacto con las bandejas sanitarias de los gatos y la tierra  que estos animales usan. Estos elementos contienen bacterias que pueden causar defectos congénitos al bebé y la posible pérdida del feto debido a un aborto espontáneo o muerte fetal.  · Evite fumar, consumir hierbas, beber alcohol y tomar fármacos que no le hayan recetado. Las sustancias químicas que estos productos contienen pueden afectar la formación y el desarrollo del bebé.  · No consuma ningún producto que contenga nicotina o tabaco, como cigarrillos y cigarrillos electrónicos. Si necesita ayuda para dejar de fumar, consulte al médico.  · Visite a su dentista si aún no lo ha hecho durante el embarazo. Use un cepillo de dientes blando para higienizarse los dientes y pásese el hilo dental con suavidad.  Comuníquese con un médico si:  · Tiene mareos.  · Siente cólicos leves, presión en la pelvis o dolor persistente en el abdomen.  · Tiene náuseas, vómitos o diarrea persistentes.  · Observa una secreción vaginal con mal olor.  · Siente dolor al orinar.  Solicite ayuda de inmediato si:  · Tiene fiebre.  · Tiene una pérdida de líquido por la vagina.  · Tiene sangrado o pequeñas pérdidas vaginales.  · Siente dolor intenso o cólicos en el abdomen.  · Sube de peso o baja de peso rápidamente.  · Tiene dificultad para respirar y siente dolor de pecho.  · Súbitamente se le hinchan mucho el rostro, las manos, los tobillos, los pies o las piernas.  · No ha sentido los movimientos del bebé durante una hora.  · Siente un dolor de cabeza intenso que no se alivia al tomar medicamentos.  · Nota cambios en la visión.  Resumen  · El segundo trimestre va desde la semana 14 hasta la 27, desde el cuarto hasta el sexto mes. Es también una época en la que el feto se desarrolla rápidamente.  · Su organismo atraviesa por muchos cambios durante el embarazo. Estos cambios varían de una mujer a otra.  · Evite fumar, consumir hierbas, beber alcohol y tomar fármacos que no le hayan recetado. Estas sustancias químicas afectan la formación y el  desarrollo de su bebé.  · No consuma ningún producto que contenga tabaco, lo que incluye cigarrillos, tabaco de mascar y cigarrillos electrónicos. Si necesita ayuda para dejar de fumar, consulte al médico.  · Comuníquese con su médico si tiene preguntas sobre esto. Concurra a todas las visitas prenatales tal como se lo haya indicado el médico. Esto es importante.  Esta información no tiene como fin reemplazar el consejo del médico. Asegúrese de hacerle al médico cualquier pregunta que tenga.  Document Released: 12/05/2004 Document Revised: 07/08/2016 Document Reviewed: 07/08/2016  Elsevier Interactive Patient Education © 2018 [employer]

## 2017-06-14 LAB — CULTURE, OB URINE

## 2017-06-14 LAB — URINE CULTURE, OB REFLEX: Organism ID, Bacteria: NO GROWTH

## 2017-07-10 ENCOUNTER — Other Ambulatory Visit: Payer: Self-pay

## 2017-07-10 DIAGNOSIS — Z3480 Encounter for supervision of other normal pregnancy, unspecified trimester: Secondary | ICD-10-CM

## 2017-07-11 ENCOUNTER — Other Ambulatory Visit: Payer: Medicaid Other

## 2017-07-11 ENCOUNTER — Ambulatory Visit (INDEPENDENT_AMBULATORY_CARE_PROVIDER_SITE_OTHER): Payer: Medicaid Other | Admitting: Student

## 2017-07-11 DIAGNOSIS — O2342 Unspecified infection of urinary tract in pregnancy, second trimester: Secondary | ICD-10-CM

## 2017-07-11 DIAGNOSIS — Z3480 Encounter for supervision of other normal pregnancy, unspecified trimester: Secondary | ICD-10-CM

## 2017-07-11 MED ORDER — PRENATAL VITAMINS 0.8 MG PO TABS: 1.0000 | ORAL_TABLET | Freq: Every day | ORAL | 12 refills | Status: DC
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
## 2017-07-11 NOTE — Patient Instructions (Signed)
Recanalizacin de la ligadura de trompas, cuidados posteriores (Tubal Ligation Reversal, Care After) Siga estas instrucciones durante las prximas semanas. Estas indicaciones le proporcionan informacin acerca de cmo deber cuidarse despus del procedimiento. El mdico tambin podr darle instrucciones ms especficas. El tratamiento ha sido planificado segn las prcticas mdicas actuales, pero en algunos casos pueden ocurrir problemas. Comunquese con el mdico si tiene algn problema o tiene dudas despus del procedimiento. QU ESPERAR DESPUS DEL PROCEDIMIENTO Despus del procedimiento, es comn [employer] siguientes sntomas:  Dolor de Advertising copywriter.  Dolor en Immunologist de la incisin.  Calambres leves.  Cansancio.  Nuseas o vmitos leves. INSTRUCCIONES PARA EL CUIDADO EN EL HOGAR  Descanse y duerma lo suficiente.  Tome los medicamentos solamente como se lo haya indicado el mdico. Estos incluyen los medicamentos recetados y de [place]. No tome aspirina, ya que puede causarle hemorragias.  Reanude gradualmente su dieta y sus actividades normales.  Evite las relaciones sexuales durante un ciclo menstrual completo, o como se lo haya indicado el mdico.  No use tampones ni se haga duchas vaginales.  No conduzca ni opere maquinaria pesada mientras toma analgsicos.  No levante ningn objeto que pese ms de 5libras (2,3kg) durante 2semanas o como se lo haya indicado el mdico.  No tome baos de inmersin. Tome duchas solamente. Pregntele al mdico cundo puede comenzar a tomar baos de inmersin.  Tmese la [employer] veces al da y regstrela.  Procure que alguien la ayude con las tareas del hogar durante los primeros 7a 10das.  Hay muchas maneras distintas de cerrar y cubrir una incisin, entre ellas, puntos (suturas), pegamento cutneo y tiras [place]. Siga las indicaciones del mdico acerca de lo siguiente: ? Cuidar la herida. ? Cambiar y Scientist, research (physical sciences) venda  (vendaje). ? Quitar el cierre de la incisin.  Controle todos los das la zona de la incisin para detectar signos de infeccin. Est atenta a lo siguiente: ? Dolor, hinchazn o enrojecimiento. ? Lquido, sangre o pus.  Concurra a todas las visitas de control como se lo haya indicado el mdico. SOLICITE ATENCIN MDICA SI:  Tiene enrojecimiento, hinchazn o aumento del dolor en la zona de la incisin.  Observa lquido o pus que salen de la incisin.  Percibe que sale mal olor de la incisin o del vendaje.  Se le abren los bordes de la incisin despus de que le hayan sacado las suturas.  Tiene una erupcin cutnea.  Tiene nuseas o vmitos.  Sufre alguna reaccin a los medicamentos.  Tiene estreimiento. SOLICITE ATENCIN MDICA DE INMEDIATO SI:  Tiene fiebre.  Tiene sensacin de desvanecimiento o se desmaya con frecuencia.  Siente ms dolor en el abdomen.  Tiene un sangrado vaginal abundante y no es el momento de su perodo menstrual.  Le falta el aire o tiene dificultad para respirar.  Siente dolor en el pecho o en las piernas.  Tiene sensacin de ardor o dolor al [employer]. Esta informacin no tiene Theme park manager el consejo del mdico. Asegrese de hacerle al mdico cualquier pregunta que tenga. Document Released: 02/13/2009 Document Revised: 07/12/2014 Elsevier Interactive Patient Education  2017 [employer].

## 2017-07-12 LAB — GLUCOSE TOLERANCE, 2 HOURS W/ 1HR
GLUCOSE, 1 HOUR: 136 mg/dL (ref 65–179)
GLUCOSE, 2 HOUR: 123 mg/dL (ref 65–152)
GLUCOSE, FASTING: 77 mg/dL (ref 65–91)

## 2017-07-17 ENCOUNTER — Other Ambulatory Visit: Payer: Medicaid Other

## 2017-07-24 ENCOUNTER — Ambulatory Visit (INDEPENDENT_AMBULATORY_CARE_PROVIDER_SITE_OTHER): Payer: Medicaid Other | Admitting: Student

## 2017-07-24 ENCOUNTER — Encounter: Payer: Self-pay | Admitting: *Deleted

## 2017-07-24 ENCOUNTER — Other Ambulatory Visit: Payer: Self-pay | Admitting: Student

## 2017-07-24 VITALS — BP 97/56 | HR 71 | Wt 144.5 lb

## 2017-07-24 DIAGNOSIS — Z789 Other specified health status: Secondary | ICD-10-CM

## 2017-07-24 DIAGNOSIS — Z3480 Encounter for supervision of other normal pregnancy, unspecified trimester: Secondary | ICD-10-CM

## 2017-07-24 DIAGNOSIS — Z3483 Encounter for supervision of other normal pregnancy, third trimester: Secondary | ICD-10-CM

## 2017-07-24 DIAGNOSIS — Z23 Encounter for immunization: Secondary | ICD-10-CM | POA: Diagnosis not present

## 2017-07-24 DIAGNOSIS — Z98891 History of uterine scar from previous surgery: Secondary | ICD-10-CM

## 2017-07-24 NOTE — Patient Instructions (Signed)
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
## 2017-07-25 LAB — CBC
HEMOGLOBIN: 12.2 g/dL (ref 11.1–15.9)
Hematocrit: 36.3 % (ref 34.0–46.6)
MCH: 31.4 pg (ref 26.6–33.0)
MCHC: 33.6 g/dL (ref 31.5–35.7)
MCV: 94 fL (ref 79–97)
Platelets: 262 10*3/uL (ref 150–379)
RBC: 3.88 x10E6/uL (ref 3.77–5.28)
RDW: 13.6 % (ref 12.3–15.4)
WBC: 11.5 10*3/uL — ABNORMAL HIGH (ref 3.4–10.8)

## 2017-07-25 LAB — RPR: RPR Ser Ql: NONREACTIVE

## 2017-07-26 ENCOUNTER — Other Ambulatory Visit: Payer: Self-pay | Admitting: Medical

## 2017-07-26 DIAGNOSIS — N898 Other specified noninflammatory disorders of vagina: Secondary | ICD-10-CM

## 2017-07-30 ENCOUNTER — Ambulatory Visit (HOSPITAL_COMMUNITY): Payer: Medicaid Other

## 2017-07-31 ENCOUNTER — Encounter (HOSPITAL_COMMUNITY): Payer: Self-pay

## 2017-07-31 ENCOUNTER — Other Ambulatory Visit (HOSPITAL_COMMUNITY): Payer: Self-pay | Admitting: *Deleted

## 2017-07-31 ENCOUNTER — Ambulatory Visit (HOSPITAL_COMMUNITY)
Admission: RE | Admit: 2017-07-31 | Discharge: 2017-07-31 | Disposition: A | Discharge status: Home / Self Care | Payer: Medicaid Other | Source: Ambulatory Visit | Attending: Student | Admitting: Student

## 2017-07-31 ENCOUNTER — Other Ambulatory Visit: Payer: Self-pay | Admitting: Student

## 2017-07-31 DIAGNOSIS — Z362 Encounter for other antenatal screening follow-up: Secondary | ICD-10-CM | POA: Insufficient documentation

## 2017-07-31 DIAGNOSIS — O09293 Supervision of pregnancy with other poor reproductive or obstetric history, third trimester: Secondary | ICD-10-CM | POA: Diagnosis not present

## 2017-07-31 DIAGNOSIS — Z3480 Encounter for supervision of other normal pregnancy, unspecified trimester: Secondary | ICD-10-CM

## 2017-07-31 DIAGNOSIS — O09523 Supervision of elderly multigravida, third trimester: Secondary | ICD-10-CM

## 2017-07-31 DIAGNOSIS — Z3A30 30 weeks gestation of pregnancy: Secondary | ICD-10-CM

## 2017-07-31 DIAGNOSIS — Z98891 History of uterine scar from previous surgery: Secondary | ICD-10-CM

## 2017-07-31 DIAGNOSIS — Z8759 Personal history of other complications of pregnancy, childbirth and the puerperium: Secondary | ICD-10-CM

## 2017-07-31 DIAGNOSIS — O34219 Maternal care for unspecified type scar from previous cesarean delivery: Secondary | ICD-10-CM | POA: Insufficient documentation

## 2017-07-31 DIAGNOSIS — O09529 Supervision of elderly multigravida, unspecified trimester: Secondary | ICD-10-CM

## 2017-07-31 NOTE — ED Notes (Signed)
[name] Hale present as interpreter. 

## 2017-08-07 ENCOUNTER — Encounter: Payer: Self-pay | Admitting: *Deleted

## 2017-08-07 ENCOUNTER — Other Ambulatory Visit (HOSPITAL_COMMUNITY)
Admission: RE | Admit: 2017-08-07 | Discharge: 2017-08-07 | Disposition: A | Discharge status: Home / Self Care | Payer: Medicaid Other | Source: Ambulatory Visit | Attending: Student | Admitting: Student

## 2017-08-07 ENCOUNTER — Ambulatory Visit (INDEPENDENT_AMBULATORY_CARE_PROVIDER_SITE_OTHER): Payer: Medicaid Other | Admitting: Student

## 2017-08-07 VITALS — BP 98/61 | HR 93 | Wt 149.3 lb

## 2017-08-07 DIAGNOSIS — N898 Other specified noninflammatory disorders of vagina: Secondary | ICD-10-CM | POA: Diagnosis present

## 2017-08-07 DIAGNOSIS — Z3480 Encounter for supervision of other normal pregnancy, unspecified trimester: Secondary | ICD-10-CM

## 2017-08-07 DIAGNOSIS — Z98891 History of uterine scar from previous surgery: Secondary | ICD-10-CM

## 2017-08-07 DIAGNOSIS — Z789 Other specified health status: Secondary | ICD-10-CM

## 2017-08-07 MED ORDER — PRENATAL 27-1 MG PO TABS: 1.0000 | ORAL_TABLET | Freq: Every day | ORAL | 8 refills | Status: DC
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
## 2017-08-07 NOTE — Patient Instructions (Signed)
Tercer trimestre de embarazo (Third Trimester of Pregnancy) El tercer trimestre comprende desde la semana29 hasta la semana42, es decir, desde el mes7 hasta el mes9. El tercer trimestre es un perodo en el que el feto crece rpidamente. Hacia el final del noveno mes, el feto mide alrededor de 20pulgadas (45cm) de largo y pesa entre 6 y 10 libras (2,700 y 4,500kg). CAMBIOS EN EL ORGANISMO Su organismo atraviesa por muchos cambios durante el embarazo, y estos varan de una mujer a otra.  Seguir aumentando de peso. Es de esperar que aumente entre 25 y 35libras (11 y 16kg) hacia el final del embarazo.  Podrn aparecer las primeras estras en las caderas, el abdomen y las mamas.  Puede tener necesidad de orinar con ms frecuencia porque el feto baja hacia la pelvis y ejerce presin sobre la vejiga.  Debido al embarazo podr sentir acidez estomacal con frecuencia.  Puede estar estreida, ya que ciertas hormonas enlentecen los movimientos de los msculos que empujan los desechos a travs de los intestinos.  Pueden aparecer hemorroides o abultarse e hincharse las venas (venas varicosas).  Puede sentir dolor plvico debido al aumento de peso y a que las hormonas del embarazo relajan las articulaciones entre los huesos de la pelvis. El dolor de espalda puede ser consecuencia de la sobrecarga de los msculos que soportan la postura.  Tal vez haya cambios en el cabello que pueden incluir su engrosamiento, crecimiento rpido y cambios en la textura. Adems, a algunas mujeres se les cae el cabello durante o despus del embarazo, o tienen el cabello seco o fino. Lo ms probable es que el cabello se le normalice despus del nacimiento del beb.  Las mamas seguirn creciendo y le dolern. A veces, puede haber una secrecin amarilla de las mamas llamada calostro.  El ombligo puede salir hacia afuera.  Puede sentir que le falta el aire debido a que se expande el tero.  Puede notar que el feto  "baja" o lo siente ms bajo, en el abdomen.  Puede tener una prdida de secrecin mucosa con sangre. Esto suele ocurrir en el trmino de unos pocos das a una semana antes de que comience el trabajo de parto.  El cuello del tero se vuelve delgado y blando (se borra) cerca de la fecha de parto. QU DEBE ESPERAR EN LOS EXMENES PRENATALES Le harn exmenes prenatales cada 2semanas hasta la semana36. A partir de ese momento le harn exmenes semanales. Durante una visita prenatal de rutina:  La pesarn para asegurarse de que usted y el feto estn creciendo normalmente.  Le tomarn la presin arterial.  Le medirn el abdomen para controlar el desarrollo del beb.  Se escucharn los latidos cardacos fetales.  Se evaluarn los resultados de los estudios solicitados en visitas anteriores.  Le revisarn el cuello del tero cuando est prxima la fecha de parto para controlar si este se ha borrado. Alrededor de la semana36, el mdico le revisar el cuello del tero. Al mismo tiempo, realizar un anlisis de las secreciones del tejido vaginal. Este examen es para determinar si hay un tipo de bacteria, estreptococo Grupo B. El mdico le explicar esto con ms detalle. El mdico puede preguntarle lo siguiente:  Cmo le gustara que fuera el parto.  Cmo se siente.  Si siente los movimientos del beb.  Si ha tenido sntomas anormales, como prdida de lquido, sangrado, dolores de cabeza intensos o clicos abdominales.  Si est consumiendo algn producto que contenga tabaco, como cigarrillos, tabaco de mascar y   cigarrillos electrnicos.  Si tiene alguna pregunta. Otros exmenes o estudios de deteccin que pueden realizarse durante el tercer trimestre incluyen lo siguiente:  Anlisis de sangre para controlar los niveles de hierro (anemia).  Controles fetales para determinar su salud, nivel de actividad y crecimiento. Si tiene alguna enfermedad o hay problemas durante el embarazo, le harn  estudios.  Prueba del VIH (virus de inmunodeficiencia humana). Si corre un riesgo alto, pueden realizarle una prueba de deteccin del VIH durante el tercer trimestre del embarazo. FALSO TRABAJO DE PARTO Es posible que sienta contracciones leves e irregulares que finalmente desaparecen. Se llaman contracciones de Braxton Hicks o falso trabajo de parto. Las contracciones pueden durar horas, das o incluso semanas, antes de que el verdadero trabajo de parto se inicie. Si las contracciones ocurren a intervalos regulares, se intensifican o se hacen dolorosas, lo mejor es que la revise el mdico. SIGNOS DE TRABAJO DE PARTO  Clicos de tipo menstrual.  Contracciones cada 5minutos o menos.  Contracciones que comienzan en la parte superior del tero y se extienden hacia abajo, a la zona inferior del abdomen y la espalda.  Sensacin de mayor presin en la pelvis o dolor de espalda.  Una secrecin de mucosidad acuosa o con sangre que sale de la vagina. Si tiene alguno de estos signos antes de la semana37 del embarazo, llame a su mdico de inmediato. Debe concurrir al hospital para que la controlen inmediatamente. INSTRUCCIONES PARA EL CUIDADO EN EL HOGAR  Evite fumar, consumir hierbas, beber alcohol y tomar frmacos que no le hayan recetado. Estas sustancias qumicas afectan la formacin y el desarrollo del beb.  No consuma ningn producto que contenga tabaco, lo que incluye cigarrillos, tabaco de mascar y cigarrillos electrnicos. Si necesita ayuda para dejar de fumar, consulte al mdico. Puede recibir asesoramiento y otro tipo de recursos para dejar de fumar.  Siga las indicaciones del mdico en relacin con el uso de medicamentos. Durante el embarazo, hay medicamentos que son seguros de tomar y otros que no.  Haga ejercicio solamente como se lo haya indicado el mdico. Sentir clicos uterinos es un buen signo para detener la actividad fsica.  Contine comiendo alimentos sanos con  regularidad.  Use un sostn que le brinde buen soporte si le duelen las mamas.  No se d baos de inmersin en agua caliente, baos turcos ni saunas.  Use el cinturn de seguridad en todo momento mientras conduce.  No coma carne cruda ni queso sin cocinar; evite el contacto con las bandejas sanitarias de los gatos y la tierra que estos animales usan. Estos elementos contienen grmenes que pueden causar defectos congnitos en el beb.  Tome las vitaminas prenatales.  Tome entre 1500 y 2000mg de calcio diariamente comenzando en la semana20 del embarazo hasta el parto.  Si est estreida, pruebe un laxante suave (si el mdico lo autoriza). Consuma ms alimentos ricos en fibra, como vegetales y frutas frescos y cereales integrales. Beba gran cantidad de lquido para mantener la orina de tono claro o color amarillo plido.  Dese baos de asiento con agua tibia para aliviar el dolor o las molestias causadas por las hemorroides. Use una crema para las hemorroides si el mdico la autoriza.  Si tiene venas varicosas, use medias de descanso. Eleve los pies durante 15minutos, 3 o 4veces por da. Limite el consumo de sal en su dieta.  Evite levantar objetos pesados, use zapatos de tacones bajos y mantenga una buena postura.  Descanse con las piernas elevadas si tiene   calambres o dolor de cintura.  Visite a su dentista si no lo ha hecho durante el embarazo. Use un cepillo de dientes blando para higienizarse los dientes y psese el hilo dental con suavidad.  Puede seguir manteniendo relaciones sexuales, a menos que el mdico le indique lo contrario.  No haga viajes largos excepto que sea absolutamente necesario y solo con la autorizacin del mdico.  Tome clases prenatales para entender, practicar y hacer preguntas sobre el trabajo de parto y el parto.  Haga un ensayo de la partida al hospital.  Prepare el bolso que llevar al hospital.  Prepare la habitacin del beb.  Concurra a todas  las visitas prenatales segn las indicaciones de su mdico.  SOLICITE ATENCIN MDICA SI:  No est segura de que est en trabajo de parto o de que ha roto la bolsa de las aguas.  Tiene mareos.  Siente clicos leves, presin en la pelvis o dolor persistente en el abdomen.  Tiene nuseas, vmitos o diarrea persistentes.  Observa una secrecin vaginal con mal olor.  Siente dolor al orinar.  SOLICITE ATENCIN MDICA DE INMEDIATO SI:  Tiene fiebre.  Tiene una prdida de lquido por la vagina.  Tiene sangrado o pequeas prdidas vaginales.  Siente dolor intenso o clicos en el abdomen.  Sube o baja de peso rpidamente.  Tiene dificultad para respirar y siente dolor de pecho.  Sbitamente se le hinchan mucho el rostro, las manos, los tobillos, los pies o las piernas.  No ha sentido los movimientos del beb durante una hora.  Siente un dolor de cabeza intenso que no se alivia con medicamentos.  Su visin se modifica.  Esta informacin no tiene como fin reemplazar el consejo del mdico. Asegrese de hacerle al mdico cualquier pregunta que tenga. Document Released: 12/05/2004 Document Revised: 03/18/2014 Document Reviewed: 04/28/2012 Elsevier Interactive Patient Education  2017 Elsevier Inc.  

## 2017-08-07 NOTE — Progress Notes (Signed)
States was not able to get PNV, pharmacy did not receive. New rx sent to correct pharmacy. C/o still having vaginal itching even thought took vaginal cream x 2 months. Denies vaginal discharge.

## 2017-08-11 ENCOUNTER — Telehealth: Payer: Self-pay | Admitting: Student

## 2017-08-11 ENCOUNTER — Other Ambulatory Visit: Payer: Self-pay | Admitting: Student

## 2017-08-11 DIAGNOSIS — B3731 Acute candidiasis of vulva and vagina: Secondary | ICD-10-CM

## 2017-08-11 DIAGNOSIS — B373 Candidiasis of vulva and vagina: Secondary | ICD-10-CM

## 2017-08-11 LAB — CERVICOVAGINAL ANCILLARY ONLY
Bacterial vaginitis: NEGATIVE
CHLAMYDIA, DNA PROBE: NEGATIVE
Candida vaginitis: POSITIVE — AB
NEISSERIA GONORRHEA: NEGATIVE
Trichomonas: NEGATIVE

## 2017-08-11 MED ORDER — TERCONAZOLE 0.8 % VA CREA: 1.0000 | TOPICAL_CREAM | Freq: Every day | VAGINAL | 0 refills | Status: DC

## 2017-08-11 NOTE — Telephone Encounter (Signed)
Called patient and informed her of her wet prep results showing yeast. Reassured her of the  Normalcy of this infection and that we are going to send her a RX for cream to use for three nights.

## 2017-08-21 ENCOUNTER — Encounter: Payer: Medicaid Other | Admitting: Student

## 2017-08-21 ENCOUNTER — Ambulatory Visit (INDEPENDENT_AMBULATORY_CARE_PROVIDER_SITE_OTHER): Payer: Medicaid Other | Admitting: Obstetrics & Gynecology

## 2017-08-21 VITALS — BP 105/64 | HR 78 | Wt 153.0 lb

## 2017-08-21 DIAGNOSIS — Z98891 History of uterine scar from previous surgery: Secondary | ICD-10-CM

## 2017-08-21 DIAGNOSIS — Z3483 Encounter for supervision of other normal pregnancy, third trimester: Secondary | ICD-10-CM

## 2017-08-21 DIAGNOSIS — Z3480 Encounter for supervision of other normal pregnancy, unspecified trimester: Secondary | ICD-10-CM

## 2017-08-21 MED ORDER — FLUCONAZOLE 150 MG PO TABS: 150.0000 mg | ORAL_TABLET | Freq: Once | ORAL | 0 refills | Status: AC
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
## 2017-08-21 NOTE — Patient Instructions (Signed)
Parto por cesárea °Cesarean Delivery °El nacimiento o parto por cesárea es el parto quirúrgico de un bebé a través de una incisión en el abdomen y el útero. Se lo puede llamar incisión cesárea. Este procedimiento se puede programar con anticipación o se puede realizar en una situación de emergencia. °Informe al médico acerca de lo siguiente: °· Cualquier alergia que tenga. °· Todos los medicamentos que utiliza, incluidos vitaminas, hierbas, gotas oftálmicas, cremas y medicamentos de venta libre. °· Cualquier problema previo que usted o los miembros de su familia hayan tenido con anestésicos. °· Enfermedades de la sangre que tenga. °· Cirugías previas. °· Cualquier enfermedad que tenga. °· Si usted o algún familiar tiene antecedentes de trombosis venosa profunda (TVP) o embolia pulmonar (EP). °¿Cuáles son los riesgos? °En general, se trata de un procedimiento seguro. Sin embargo, pueden ocurrir complicaciones, por ejemplo: °· Una infección. °· Hemorragia. °· Reacciones alérgicas a los medicamentos. °· Daños a otras estructuras u otros órganos. °· Coágulos sanguíneos. °· Lesiones al bebé. ° °¿Qué ocurre antes del procedimiento? °· Siga las indicaciones del médico respecto de las restricciones para las comidas o las bebidas. °· Siga las indicaciones del médico respecto de bañarse antes del procedimiento para ayudar a reducir el riesgo de infección. °· Si sabe que va a tener un parto por cesárea, no se rasure la zona púbica. Rasurarse antes del procedimiento puede aumentar el riesgo de infección. °· Consulte al médico si debe hacer o no lo siguiente: °? Cambiar o suspender los medicamentos que toma habitualmente. Esto es muy importante si toma medicamentos para la diabetes o anticoagulantes. °? El plan para controlar el dolor. Esto es muy importante si piensa amamantar a su bebé. °? Cuánto tiempo permanecerá en el hospital después del procedimiento. °? Cualquier inquietud que pueda tener sobre recibir hemoderivados si  los necesita durante el procedimiento. °? Almacenamiento de sangre del cordón, si planea guardar la sangre del cordón umbilical del bebé. °· Quizá también desee preguntarle al médico lo siguiente: °? Si va a poder sostener o amamantar a su bebé mientras aún se encuentre en el quirófano. °? Si su bebé puede quedarse con usted inmediatamente después del procedimiento y durante su recuperación. °? Si un familiar o la persona que usted elija puede ingresar al quirófano y permanecer con usted durante el procedimiento, inmediatamente después de este y durante su recuperación. °· Haga arreglos para que alguien la lleve a su casa cuando le den el alta hospitalaria. °¿Qué ocurre durante el procedimiento? °· Se le colocarán monitores fetales en el abdomen para controlar la frecuencia cardíaca de su bebé y la suya. °· Según el motivo del parto por cesárea, es posible que se le realice un examen físico o una prueba adicional, como una ecografía. °· Le colocarán una vía intravenosa (IV) en una de las venas. °· Posiblemente le realicen un análisis de sangre u orina. °· Se le administrarán antibióticos para ayudar a prevenir las infecciones. °· Se le puede entregar una bata especial de calentamiento para mantener estable la temperatura corporal. °· Pueden rasurarle a zona púbica. °· Le limpiarán la piel de la zona púbica y de la parte inferior del abdomen con una solución para destruir las bacterias (antiséptico). °· Se le puede insertar un catéter en la vejiga a través de la uretra. Esto se hace para drenar la orina durante el procedimiento. °· Pueden administrarle uno o más de los siguientes medicamentos: °? Un medicamento para adormecer la zona (anestesia local). °? Un medicamento que lo hará   dormir (anestesia general). °? Un medicamento (anestesia regional) que se le inyecta en la espalda o a través de un tubo pequeño y delgado que se le coloca en la espalda (anestesia raquídea o anestesia epidural). Esto adormece la parte del  cuerpo que está por debajo del lugar de la inyección y le permite permanecer despierta durante el procedimiento. Si llega a sentir náuseas, dígaselo al médico. Tendrá medicamentos disponibles para ayudarla a reducir cualquier náusea que pueda sentir. °· Le harán una incisión en el abdomen y luego en el útero. °· Si está despierta durante el procedimiento, puede sentir tirones en el abdomen, pero no debería sentir dolor. Si siente dolor, dígaselo al médico inmediatamente. °· Se sacará al bebé del útero. Es posible que sienta más presión o un tirón, mientras esto sucede. °· Inmediatamente después del parto, se secará al bebé y se lo mantendrá caliente. Podrá sostener y amamantar a su bebé. Durante este momento, es posible que se pince y corte el cordón umbilical. °· Se le extraerá la placenta del útero. °· Las incisiones se cerrarán con puntos (suturas). Es posible que se apliquen grapas, pegamento para la piel o tiras adhesivas en la incisión del abdomen. °· Se colocarán vendas (vendajes) sobre la incisión del abdomen. °Este procedimiento puede variar según el médico y el hospital. °¿Qué sucede después del procedimiento? °· Le controlarán con frecuencia la presión arterial, la frecuencia cardíaca, la frecuencia respiratoria y el nivel de oxígeno en la sangre hasta que haya desaparecido el efecto de los medicamentos administrados. °· Puede seguir recibiendo líquidos o medicamentos por la vía intravenosa. °· Sentirá un poco de dolor. Tendrá analgésicos disponibles para ayudarla a controlar el dolor. °· Para evitar la formación de coágulos sanguíneos: °? Se le pueden administrar medicamentos. °? Quizás deba usar medias o dispositivos de compresión. °? Se le indicará que camine cuando pueda hacerlo. °· El personal del hospital alentará y apoyará que cree lazos con su bebé. En el hospital, se le puede permitir que el bebé permanezca en la misma habitación que usted (internación conjunta) durante la hospitalización para  fomentar un amamantamiento exitoso. °· Se le puede sugerir que tosa y respire profundamente con frecuencia. Esto ayuda a evitar problemas pulmonares. °· Si tiene un catéter que le drena la orina, se le quitará lo antes posible después del procedimiento. °Esta información no tiene como fin reemplazar el consejo del médico. Asegúrese de hacerle al médico cualquier pregunta que tenga. °Document Released: 02/25/2005 Document Revised: 05/27/2016 Document Reviewed: 12/06/2014 °Elsevier Interactive Patient Education © 2018 Elsevier Inc. ° °

## 2017-08-28 ENCOUNTER — Other Ambulatory Visit (HOSPITAL_COMMUNITY): Payer: Self-pay | Admitting: Obstetrics and Gynecology

## 2017-08-28 ENCOUNTER — Ambulatory Visit (HOSPITAL_COMMUNITY)
Admission: RE | Admit: 2017-08-28 | Discharge: 2017-08-28 | Disposition: A | Discharge status: Home / Self Care | Payer: Medicaid Other | Source: Ambulatory Visit | Attending: Student | Admitting: Student

## 2017-08-28 ENCOUNTER — Encounter (HOSPITAL_COMMUNITY): Payer: Self-pay

## 2017-08-28 DIAGNOSIS — O34219 Maternal care for unspecified type scar from previous cesarean delivery: Secondary | ICD-10-CM

## 2017-08-28 DIAGNOSIS — Z3A34 34 weeks gestation of pregnancy: Secondary | ICD-10-CM

## 2017-08-28 DIAGNOSIS — O09299 Supervision of pregnancy with other poor reproductive or obstetric history, unspecified trimester: Secondary | ICD-10-CM

## 2017-08-28 DIAGNOSIS — O09293 Supervision of pregnancy with other poor reproductive or obstetric history, third trimester: Secondary | ICD-10-CM | POA: Diagnosis not present

## 2017-08-28 DIAGNOSIS — O09523 Supervision of elderly multigravida, third trimester: Secondary | ICD-10-CM | POA: Diagnosis present

## 2017-08-28 DIAGNOSIS — Z362 Encounter for other antenatal screening follow-up: Secondary | ICD-10-CM

## 2017-09-05 ENCOUNTER — Inpatient Hospital Stay (HOSPITAL_COMMUNITY)
Admission: AD | Admit: 2017-09-05 | Discharge: 2017-09-05 | Disposition: A | Discharge status: Home / Self Care | Payer: Medicaid Other | Source: Ambulatory Visit | Attending: Obstetrics and Gynecology | Admitting: Obstetrics and Gynecology

## 2017-09-05 ENCOUNTER — Encounter (HOSPITAL_COMMUNITY): Payer: Self-pay | Admitting: *Deleted

## 2017-09-05 ENCOUNTER — Ambulatory Visit (INDEPENDENT_AMBULATORY_CARE_PROVIDER_SITE_OTHER): Payer: Medicaid Other | Admitting: Obstetrics & Gynecology

## 2017-09-05 ENCOUNTER — Other Ambulatory Visit: Payer: Self-pay

## 2017-09-05 VITALS — BP 102/63 | HR 93 | Wt 153.0 lb

## 2017-09-05 DIAGNOSIS — Z881 Allergy status to other antibiotic agents status: Secondary | ICD-10-CM | POA: Diagnosis not present

## 2017-09-05 DIAGNOSIS — O26891 Other specified pregnancy related conditions, first trimester: Secondary | ICD-10-CM

## 2017-09-05 DIAGNOSIS — Z3009 Encounter for other general counseling and advice on contraception: Secondary | ICD-10-CM | POA: Insufficient documentation

## 2017-09-05 DIAGNOSIS — Z0371 Encounter for suspected problem with amniotic cavity and membrane ruled out: Secondary | ICD-10-CM | POA: Diagnosis present

## 2017-09-05 DIAGNOSIS — O99343 Other mental disorders complicating pregnancy, third trimester: Secondary | ICD-10-CM | POA: Diagnosis not present

## 2017-09-05 DIAGNOSIS — F4310 Post-traumatic stress disorder, unspecified: Secondary | ICD-10-CM | POA: Insufficient documentation

## 2017-09-05 DIAGNOSIS — Z79899 Other long term (current) drug therapy: Secondary | ICD-10-CM | POA: Diagnosis not present

## 2017-09-05 DIAGNOSIS — N898 Other specified noninflammatory disorders of vagina: Secondary | ICD-10-CM

## 2017-09-05 DIAGNOSIS — O09529 Supervision of elderly multigravida, unspecified trimester: Secondary | ICD-10-CM

## 2017-09-05 DIAGNOSIS — Z3480 Encounter for supervision of other normal pregnancy, unspecified trimester: Secondary | ICD-10-CM

## 2017-09-05 DIAGNOSIS — Z3A35 35 weeks gestation of pregnancy: Secondary | ICD-10-CM | POA: Diagnosis not present

## 2017-09-05 DIAGNOSIS — Z98891 History of uterine scar from previous surgery: Secondary | ICD-10-CM

## 2017-09-05 DIAGNOSIS — Z789 Other specified health status: Secondary | ICD-10-CM

## 2017-09-05 DIAGNOSIS — Z3493 Encounter for supervision of normal pregnancy, unspecified, third trimester: Secondary | ICD-10-CM

## 2017-09-05 LAB — AMNISURE RUPTURE OF MEMBRANE (ROM) NOT AT ARMC: Amnisure ROM: NEGATIVE
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
## 2017-09-05 NOTE — Progress Notes (Signed)
Skin to skin education done.

## 2017-09-05 NOTE — Patient Instructions (Signed)

## 2017-09-05 NOTE — MAU Note (Signed)
Sent up from clinic, ? Rom.  Neg pooling on spec exam.  cx was closed.  First noted ? D/c last Wed.  Has not really seen any today, was white, pt thought normal d/c. Denies bleeding or pain.
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
## 2017-09-05 NOTE — Progress Notes (Signed)
Report called to Charge nurse Jolynn and patient taken to MAU per Dr. Marice Potterove for r/o rupture.

## 2017-09-05 NOTE — Discharge Instructions (Signed)

## 2017-09-10 ENCOUNTER — Ambulatory Visit (INDEPENDENT_AMBULATORY_CARE_PROVIDER_SITE_OTHER): Payer: Medicaid Other | Admitting: Advanced Practice Midwife

## 2017-09-10 VITALS — BP 103/65 | HR 72 | Wt 153.5 lb

## 2017-09-10 DIAGNOSIS — Z3480 Encounter for supervision of other normal pregnancy, unspecified trimester: Secondary | ICD-10-CM

## 2017-09-10 DIAGNOSIS — K5900 Constipation, unspecified: Secondary | ICD-10-CM

## 2017-09-10 MED ORDER — POLYETHYLENE GLYCOL 3350 17 GM/SCOOP PO POWD: 17.0000 g | Freq: Every day | ORAL | 0 refills | Status: DC

## 2017-09-10 MED ORDER — DOCUSATE SODIUM 100 MG PO CAPS: 100.0000 mg | ORAL_CAPSULE | Freq: Two times a day (BID) | ORAL | 2 refills | Status: DC | PRN

## 2017-09-10 NOTE — Patient Instructions (Addendum)
Elmer Picker para volver a MAU:  1. Las contracciones son cada 5 minutos o menos, cada uno de los ltimos 1 minuto, stos han llevado a cabo durante 1-2 horas, y no se puede caminar o hablar durante ellas 2. Usted tiene un gran chorro de lquido o un goteo de lquido que no se detiene y hay que usar una toalla 3. Ha de sangrado que es de color rojo brillante, denso que el manchado - como sangrado menstrual (manchado puede ser normal en trabajo de parto prematuro o despus de una comprobacin de su cuello uterino) 4. Usted no se siente el beb se mueve como l / ella hace normalmente    Dieta rica en fibra High-Fiber Diet La fibra, tambin llamada fibra dietaria, es un tipo de carbohidrato que se encuentra en las frutas, las verduras, los cereales integrales y los frijoles. Una dieta rica en fibra puede tener muchos beneficios para la salud. El mdico puede recomendar una dieta rica en fibra para ayudar a:  Chief Strategy Officer. La fibra puede hacer que defeque con ms frecuencia.  Disminuir el nivel de colesterol.  Aliviar las hemorroides, la diverticulosis no complicada o el sndrome de colon irritable.  Evitar comer en exceso como parte de un plan para bajar de peso.  Evitar la enfermedad cardaca, la diabetes tipo 2 y ciertos cnceres.  En qu consiste el plan? El consumo diario recomendado de fibra incluye lo siguiente:  38gramos para los hombres menores de [place].  30gramos para los hombres [address] de [place].  25gramos para las mujeres menores de 50 aos.  21gramos para las [employer] de [place].  Puede lograr el consumo diario recomendado de fibra si come una variedad de frutas, verduras, cereales y frijoles. El mdico tambin puede recomendar un suplemento de fibra si no es posible obtener suficiente fibra a travs de la dieta. Qu debo saber acerca de la dieta rica en fibra?  La eficacia de los suplementos de [place] no ha sido estudiada ampliamente, de modo  que es mejor obtener fibra directamente de los alimentos.  Verifique siempre el contenido de fibra en la etiqueta de informacin nutricional de los alimentos preenvasados. Busque alimentos que contengan al menos 5gramos de fibra por porcin.  Consulte a un nutricionista si tiene preguntas sobre algunos alimentos especficos relacionados con su enfermedad, especialmente si estos alimentos no se mencionan a continuacin.  Aumente el consumo diario de fibra en forma gradual. Aumentar demasiado rpido el consumo de fibra dietaria puede provocar distensin abdominal, clicos o gases.  Beber abundante agua. El [place] a Geophysicist/field seismologist. Qu alimentos puedo comer? Cereales Panes integrales. Cereal multigrano. Avena. Arroz integral. Gypsy Decant. Trigo burgol. Mijo. Magdalenas de salvado. Palomitas de maz. Galletas de centeno. Verduras Batatas. [place]. Col rizada. Alcachofas. Repollo. Brcoli. Guisantes. Zanahorias. [place]. Nils Pyle Bayas. Peras. Manzanas. Naranjas. Aguacates. Ciruelas y pasas. Higos secos. Carnes y otras fuentes de protenas Frijoles blancos, colorados, pintos y porotos de soja. Guisantes secos. Lentejas. Frutos secos y semillas. Lcteos Yogur fortificado con Research scientist (life sciences). Bebidas Leche de soja fortificada con Bjorn Loser. Jugo de naranja fortificado con Bjorn Loser. Otros Barras de [place]. Es posible que los productos que se enumeran ms arriba no sean una lista completa de las bebidas o los alimentos recomendados. Comunquese con el nutricionista para conocer ms opciones. Qu alimentos no se recomiendan? Cereales Pan blanco. Pastas hechas con Webb Laws. Arroz blanco. Verduras Papas fritas. Verduras enlatadas. Verduras muy cocidas. Frutas Jugo de frutas. Frutas cocidas coladas. Carnes y [place] fuentes  de protenas Cortes de carne con alto contenido de grasa. Aves o pescados fritos. Lcteos Leche. Yogur. Queso crema. PPG IndustriesCrema cida. Bebidas Gaseosas. Otros Tortas y pasteles.  [place] y aceites. Es posible que los productos que se enumeran ms arriba no sean una lista completa de los alimentos y las bebidas que se Theatre stage managerdeben evitar. Comunquese con el nutricionista para obtener ms informacin. Cules son algunos consejos para incluir alimentos ricos en fibra en la dieta?  Consuma una gran variedad de alimentos ricos en fibra.  Asegrese de que la mitad de todos los cereales consumidos cada da sean cereales integrales.  Reemplace los panes y cereales hechos de harina refinada o harina blanca por panes y cereales integrales.  Reemplace el arroz blanco por arroz integral, trigo burgol o mijo.  Comience Medical laboratory scientific officerel da con un desayuno rico en [place], como un cereal que contenga al menos 5gramos de fibra por porcin.  Use guisantes en lugar de carne en las sopas, ensaladas o pastas.  Coma refrigerios ricos en fibra, como frutos rojos, verduras crudas, frutos secos o palomitas de maz. Esta informacin no tiene Theme park managercomo fin reemplazar el consejo del mdico. Asegrese de hacerle al mdico cualquier pregunta que tenga. Document Released: 02/25/2005 Document Revised: 07/03/2016 Document Reviewed: 08/10/2013 Elsevier Interactive Patient Education  [employer]2018 Elsevier Inc.
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
## 2017-09-12 ENCOUNTER — Other Ambulatory Visit (HOSPITAL_COMMUNITY)
Admission: RE | Admit: 2017-09-12 | Discharge: 2017-09-12 | Disposition: A | Discharge status: Home / Self Care | Payer: Medicaid Other | Source: Ambulatory Visit | Attending: Advanced Practice Midwife | Admitting: Advanced Practice Midwife

## 2017-09-12 DIAGNOSIS — Z3480 Encounter for supervision of other normal pregnancy, unspecified trimester: Secondary | ICD-10-CM | POA: Insufficient documentation

## 2017-09-12 NOTE — Addendum Note (Signed)
Addended by: Faythe CasaBELLAMY, Angelyna Henderson M on: 09/12/2017 09:42 AM   Modules accepted: Orders

## 2017-09-14 LAB — CULTURE, BETA STREP (GROUP B ONLY): Strep Gp B Culture: NEGATIVE

## 2017-09-15 LAB — GC/CHLAMYDIA PROBE AMP (~~LOC~~) NOT AT ARMC
Chlamydia: NEGATIVE
Neisseria Gonorrhea: NEGATIVE

## 2017-09-18 ENCOUNTER — Encounter: Payer: Self-pay | Admitting: Obstetrics & Gynecology

## 2017-09-18 ENCOUNTER — Ambulatory Visit (INDEPENDENT_AMBULATORY_CARE_PROVIDER_SITE_OTHER): Payer: Medicaid Other | Admitting: Obstetrics & Gynecology

## 2017-09-18 VITALS — BP 110/71 | HR 94 | Wt 156.1 lb

## 2017-09-18 DIAGNOSIS — Z3009 Encounter for other general counseling and advice on contraception: Secondary | ICD-10-CM

## 2017-09-18 DIAGNOSIS — Z98891 History of uterine scar from previous surgery: Secondary | ICD-10-CM

## 2017-09-18 DIAGNOSIS — Z3480 Encounter for supervision of other normal pregnancy, unspecified trimester: Secondary | ICD-10-CM

## 2017-09-18 MED ORDER — PRENATAL 27-1 MG PO TABS: 1.0000 | ORAL_TABLET | Freq: Every day | ORAL | 8 refills | Status: DC

## 2017-09-18 NOTE — Patient Instructions (Signed)
Parto por cesárea °Cesarean Delivery °El nacimiento o parto por cesárea es el parto quirúrgico de un bebé a través de una incisión en el abdomen y el útero. Se lo puede llamar incisión cesárea. Este procedimiento se puede programar con anticipación o se puede realizar en una situación de emergencia. °Informe al médico acerca de lo siguiente: °· Cualquier alergia que tenga. °· Todos los medicamentos que utiliza, incluidos vitaminas, hierbas, gotas oftálmicas, cremas y medicamentos de venta libre. °· Cualquier problema previo que usted o los miembros de su familia hayan tenido con anestésicos. °· Enfermedades de la sangre que tenga. °· Cirugías previas. °· Cualquier enfermedad que tenga. °· Si usted o algún familiar tiene antecedentes de trombosis venosa profunda (TVP) o embolia pulmonar (EP). °¿Cuáles son los riesgos? °En general, se trata de un procedimiento seguro. Sin embargo, pueden ocurrir complicaciones, por ejemplo: °· Una infección. °· Hemorragia. °· Reacciones alérgicas a los medicamentos. °· Daños a otras estructuras u otros órganos. °· Coágulos sanguíneos. °· Lesiones al bebé. ° °¿Qué ocurre antes del procedimiento? °· Siga las indicaciones del médico respecto de las restricciones para las comidas o las bebidas. °· Siga las indicaciones del médico respecto de bañarse antes del procedimiento para ayudar a reducir el riesgo de infección. °· Si sabe que va a tener un parto por cesárea, no se rasure la zona púbica. Rasurarse antes del procedimiento puede aumentar el riesgo de infección. °· Consulte al médico si debe hacer o no lo siguiente: °? Cambiar o suspender los medicamentos que toma habitualmente. Esto es muy importante si toma medicamentos para la diabetes o anticoagulantes. °? El plan para controlar el dolor. Esto es muy importante si piensa amamantar a su bebé. °? Cuánto tiempo permanecerá en el hospital después del procedimiento. °? Cualquier inquietud que pueda tener sobre recibir hemoderivados si  los necesita durante el procedimiento. °? Almacenamiento de sangre del cordón, si planea guardar la sangre del cordón umbilical del bebé. °· Quizá también desee preguntarle al médico lo siguiente: °? Si va a poder sostener o amamantar a su bebé mientras aún se encuentre en el quirófano. °? Si su bebé puede quedarse con usted inmediatamente después del procedimiento y durante su recuperación. °? Si un familiar o la persona que usted elija puede ingresar al quirófano y permanecer con usted durante el procedimiento, inmediatamente después de este y durante su recuperación. °· Haga arreglos para que alguien la lleve a su casa cuando le den el alta hospitalaria. °¿Qué ocurre durante el procedimiento? °· Se le colocarán monitores fetales en el abdomen para controlar la frecuencia cardíaca de su bebé y la suya. °· Según el motivo del parto por cesárea, es posible que se le realice un examen físico o una prueba adicional, como una ecografía. °· Le colocarán una vía intravenosa (IV) en una de las venas. °· Posiblemente le realicen un análisis de sangre u orina. °· Se le administrarán antibióticos para ayudar a prevenir las infecciones. °· Se le puede entregar una bata especial de calentamiento para mantener estable la temperatura corporal. °· Pueden rasurarle a zona púbica. °· Le limpiarán la piel de la zona púbica y de la parte inferior del abdomen con una solución para destruir las bacterias (antiséptico). °· Se le puede insertar un catéter en la vejiga a través de la uretra. Esto se hace para drenar la orina durante el procedimiento. °· Pueden administrarle uno o más de los siguientes medicamentos: °? Un medicamento para adormecer la zona (anestesia local). °? Un medicamento que lo hará   dormir (anestesia general). °? Un medicamento (anestesia regional) que se le inyecta en la espalda o a través de un tubo pequeño y delgado que se le coloca en la espalda (anestesia raquídea o anestesia epidural). Esto adormece la parte del  cuerpo que está por debajo del lugar de la inyección y le permite permanecer despierta durante el procedimiento. Si llega a sentir náuseas, dígaselo al médico. Tendrá medicamentos disponibles para ayudarla a reducir cualquier náusea que pueda sentir. °· Le harán una incisión en el abdomen y luego en el útero. °· Si está despierta durante el procedimiento, puede sentir tirones en el abdomen, pero no debería sentir dolor. Si siente dolor, dígaselo al médico inmediatamente. °· Se sacará al bebé del útero. Es posible que sienta más presión o un tirón, mientras esto sucede. °· Inmediatamente después del parto, se secará al bebé y se lo mantendrá caliente. Podrá sostener y amamantar a su bebé. Durante este momento, es posible que se pince y corte el cordón umbilical. °· Se le extraerá la placenta del útero. °· Las incisiones se cerrarán con puntos (suturas). Es posible que se apliquen grapas, pegamento para la piel o tiras adhesivas en la incisión del abdomen. °· Se colocarán vendas (vendajes) sobre la incisión del abdomen. °Este procedimiento puede variar según el médico y el hospital. °¿Qué sucede después del procedimiento? °· Le controlarán con frecuencia la presión arterial, la frecuencia cardíaca, la frecuencia respiratoria y el nivel de oxígeno en la sangre hasta que haya desaparecido el efecto de los medicamentos administrados. °· Puede seguir recibiendo líquidos o medicamentos por la vía intravenosa. °· Sentirá un poco de dolor. Tendrá analgésicos disponibles para ayudarla a controlar el dolor. °· Para evitar la formación de coágulos sanguíneos: °? Se le pueden administrar medicamentos. °? Quizás deba usar medias o dispositivos de compresión. °? Se le indicará que camine cuando pueda hacerlo. °· El personal del hospital alentará y apoyará que cree lazos con su bebé. En el hospital, se le puede permitir que el bebé permanezca en la misma habitación que usted (internación conjunta) durante la hospitalización para  fomentar un amamantamiento exitoso. °· Se le puede sugerir que tosa y respire profundamente con frecuencia. Esto ayuda a evitar problemas pulmonares. °· Si tiene un catéter que le drena la orina, se le quitará lo antes posible después del procedimiento. °Esta información no tiene como fin reemplazar el consejo del médico. Asegúrese de hacerle al médico cualquier pregunta que tenga. °Document Released: 02/25/2005 Document Revised: 05/27/2016 Document Reviewed: 12/06/2014 °Elsevier Interactive Patient Education © 2018 Elsevier Inc. ° °
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
## 2017-09-19 ENCOUNTER — Encounter (HOSPITAL_COMMUNITY): Payer: Self-pay

## 2017-09-19 NOTE — Pre-Procedure Instructions (Signed)
[id number] interpreter number Preadmission screen

## 2017-09-24 ENCOUNTER — Ambulatory Visit (INDEPENDENT_AMBULATORY_CARE_PROVIDER_SITE_OTHER): Payer: Medicaid Other | Admitting: Family Medicine

## 2017-09-24 VITALS — BP 108/67 | HR 75 | Wt 155.4 lb

## 2017-09-24 DIAGNOSIS — Z3480 Encounter for supervision of other normal pregnancy, unspecified trimester: Secondary | ICD-10-CM

## 2017-09-24 DIAGNOSIS — Z789 Other specified health status: Secondary | ICD-10-CM

## 2017-09-24 DIAGNOSIS — Z3483 Encounter for supervision of other normal pregnancy, third trimester: Secondary | ICD-10-CM

## 2017-09-24 DIAGNOSIS — O09292 Supervision of pregnancy with other poor reproductive or obstetric history, second trimester: Secondary | ICD-10-CM

## 2017-09-24 DIAGNOSIS — Z98891 History of uterine scar from previous surgery: Secondary | ICD-10-CM
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
## 2017-09-24 NOTE — Patient Instructions (Signed)
Rooming In Your baby will stay in your room with you for the entire time you are in the hospital. This helps with the following: . Allows Mom to learn baby's feeding cues - Fluttering eyes - Sucking on tongue or hand - Rooting (opens mouth and turns head) - Nuzzling into the breast - Bringing hand to mouth . Allows breastfeeding on demand (when your baby is ready) . Helps baby to be calm and content . Ensures a good milk supply . Prevents complications with breastfeeding . Allows parents to learn to care for baby . Allows you to request assistance with breastfeeding

## 2017-09-26 ENCOUNTER — Other Ambulatory Visit: Payer: Self-pay | Admitting: Family Medicine

## 2017-09-29 ENCOUNTER — Encounter (HOSPITAL_COMMUNITY)
Admission: RE | Admit: 2017-09-29 | Discharge: 2017-09-29 | Disposition: A | Discharge status: Home / Self Care | Payer: Medicaid Other | Source: Ambulatory Visit | Attending: Family Medicine | Admitting: Family Medicine

## 2017-09-29 LAB — CBC
HCT: 37.3 % (ref 36.0–46.0)
Hemoglobin: 12.6 g/dL (ref 12.0–15.0)
MCH: 30.5 pg (ref 26.0–34.0)
MCHC: 33.8 g/dL (ref 30.0–36.0)
MCV: 90.3 fL (ref 78.0–100.0)
Platelets: 250 10*3/uL (ref 150–400)
RBC: 4.13 MIL/uL (ref 3.87–5.11)
RDW: 14 % (ref 11.5–15.5)
WBC: 10.4 10*3/uL (ref 4.0–10.5)

## 2017-09-29 LAB — TYPE AND SCREEN
ABO/RH(D): O POS
ANTIBODY SCREEN: NEGATIVE

## 2017-09-29 LAB — RAPID HIV SCREEN (HIV 1/2 AB+AG)
HIV 1/2 ANTIBODIES: NONREACTIVE
HIV-1 P24 Antigen - HIV24: NONREACTIVE

## 2017-09-29 NOTE — Patient Instructions (Signed)
Instrucciones Para Antes de la Ciruga   Su ciruga est programada para 09/30/2017  (your procedure is scheduled on) Entre por la entrada principal del [facility]  a las [address] de la [place] -(enter through the main entrance at [facility] at [employer] AM    [employer]ante el telfono, [place] el [phone] para informarnos de su llegada. (pick up phone, dial [phone] on arrival)     Por favor llame al [phone] si tiene algn problema en la maana de la ciruga (please call this number if you have any problems the morning of surgery.)                  Recuerde: (Remember)  No coma alimentos. (Do not eat food (After Midnight) Desps de medianoche)    No tome lquidos claros. (Do not drink clear liquids (After Midnight) Desps de medianoche)    No use joyas, maquillaje de ojos, lpiz labial, crema para el cuerpo o esmalte de uas oscuro. (Do not wear jewelry, eye makeup, lipstick, body lotion, or dark fingernail polish). Puede usar desodorante (you may wear deodorant)    No se afeite 48 horas de su ciruga. (Do not shave 48 hours before your surgery)    No traiga objetos de valor al hospital.  [place] no se hace responsable de ninguna pertenencia, ni objetos de valor que haya trado al hospital. (Do not bring valuable to the hospital.  [place] is not responsible for any belongings or valuables brought to the hospital)   [facility] estas medicinas en la maana de la ciruga con un SORBITO de agua nada (take these meds the morning of surgery with a SIP of water)     Durante la ciruga no se pueden usar lentes de contacto, dentaduras o puentes. (Contacts, dentures or bridgework cannot be worn in surgery).   Si va a ser ingresado despus de la ciruga, deje la AM[employer] Corporationmaleta en el carro hasta que se le haya asignado una habitacin. (If you are to be admitted after surgery, leave suitcase in car until your room has been assigned.)   A los pacientes que se les d de alta  el mismo da no se les permitir manejar a casa.  (Patients discharged on the day of surgery will not be allowed to drive home)    [place] y nmero de telfono del Programmer, multimediaconductor na. (Name and telephone number of your driver)   Instrucciones especiales N/A (Special Instructions)   Por favor, lea las hojas informativas que le entregaron. (Please read over the following fact sheets that you were given) Surgical Site Infection Prevention

## 2017-09-30 ENCOUNTER — Inpatient Hospital Stay (HOSPITAL_COMMUNITY)
Admission: RE | Admit: 2017-09-30 | Discharge: 2017-10-02 | DRG: 785 | Disposition: A | Discharge status: Home / Self Care | Payer: Medicaid Other | Attending: Family Medicine | Admitting: Family Medicine

## 2017-09-30 ENCOUNTER — Encounter (HOSPITAL_COMMUNITY): Payer: Self-pay

## 2017-09-30 ENCOUNTER — Encounter (HOSPITAL_COMMUNITY)
Admission: RE | Disposition: A | Discharge status: Home / Self Care | Payer: Self-pay | Source: Home / Self Care | Attending: Family Medicine

## 2017-09-30 ENCOUNTER — Inpatient Hospital Stay (HOSPITAL_COMMUNITY): Payer: Medicaid Other | Admitting: Anesthesiology

## 2017-09-30 ENCOUNTER — Encounter (HOSPITAL_COMMUNITY): Payer: Self-pay | Admitting: *Deleted

## 2017-09-30 DIAGNOSIS — Z9851 Tubal ligation status: Secondary | ICD-10-CM

## 2017-09-30 DIAGNOSIS — Z789 Other specified health status: Secondary | ICD-10-CM | POA: Diagnosis present

## 2017-09-30 DIAGNOSIS — Z98891 History of uterine scar from previous surgery: Secondary | ICD-10-CM

## 2017-09-30 DIAGNOSIS — O34211 Maternal care for low transverse scar from previous cesarean delivery: Secondary | ICD-10-CM | POA: Diagnosis present

## 2017-09-30 DIAGNOSIS — Z758 Other problems related to medical facilities and other health care: Secondary | ICD-10-CM | POA: Diagnosis present

## 2017-09-30 DIAGNOSIS — Z3A39 39 weeks gestation of pregnancy: Secondary | ICD-10-CM

## 2017-09-30 DIAGNOSIS — Z302 Encounter for sterilization: Secondary | ICD-10-CM

## 2017-09-30 DIAGNOSIS — Z87898 Personal history of other specified conditions: Secondary | ICD-10-CM

## 2017-09-30 DIAGNOSIS — Z3009 Encounter for other general counseling and advice on contraception: Secondary | ICD-10-CM | POA: Diagnosis present

## 2017-09-30 LAB — RPR: RPR Ser Ql: NONREACTIVE

## 2017-09-30 SURGERY — Surgical Case
Anesthesia: Spinal | Site: Abdomen | Wound class: Clean Contaminated

## 2017-09-30 MED ORDER — FENTANYL CITRATE (PF) 100 MCG/2ML IJ SOLN
INTRAMUSCULAR | Status: DC | PRN
  Administered 2017-09-30: 20 ug via INTRATHECAL

## 2017-09-30 MED ORDER — NALOXONE HCL 0.4 MG/ML IJ SOLN: 0.4000 mg | INTRAMUSCULAR | Status: DC | PRN

## 2017-09-30 MED ORDER — CEFAZOLIN SODIUM-DEXTROSE 2-4 GM/100ML-% IV SOLN
2.0000 g | INTRAVENOUS | Status: AC
  Administered 2017-09-30: 2 g via INTRAVENOUS

## 2017-09-30 MED ORDER — DIBUCAINE 1 % RE OINT: 1.0000 "application " | TOPICAL_OINTMENT | RECTAL | Status: DC | PRN

## 2017-09-30 MED ORDER — STERILE WATER FOR IRRIGATION IR SOLN
Status: DC | PRN
  Administered 2017-09-30: 1000 mL

## 2017-09-30 MED ORDER — SIMETHICONE 80 MG PO CHEW: 80.0000 mg | CHEWABLE_TABLET | ORAL | Status: DC | PRN

## 2017-09-30 MED ORDER — ONDANSETRON HCL 4 MG/2ML IJ SOLN
INTRAMUSCULAR | Status: AC
  Filled 2017-09-30: qty 2

## 2017-09-30 MED ORDER — DIPHENHYDRAMINE HCL 25 MG PO CAPS: 25.0000 mg | ORAL_CAPSULE | ORAL | Status: DC | PRN

## 2017-09-30 MED ORDER — BUPIVACAINE IN DEXTROSE 0.75-8.25 % IT SOLN
INTRATHECAL | Status: DC | PRN
  Administered 2017-09-30: 11.25 mg via INTRATHECAL

## 2017-09-30 MED ORDER — ONDANSETRON HCL 4 MG/2ML IJ SOLN
INTRAMUSCULAR | Status: DC | PRN
  Administered 2017-09-30: 4 mg via INTRAVENOUS

## 2017-09-30 MED ORDER — PROMETHAZINE HCL 25 MG/ML IJ SOLN: 6.2500 mg | INTRAMUSCULAR | Status: DC | PRN

## 2017-09-30 MED ORDER — DIPHENHYDRAMINE HCL 25 MG PO CAPS: 25.0000 mg | ORAL_CAPSULE | Freq: Four times a day (QID) | ORAL | Status: DC | PRN

## 2017-09-30 MED ORDER — SIMETHICONE 80 MG PO CHEW
80.0000 mg | CHEWABLE_TABLET | Freq: Three times a day (TID) | ORAL | Status: DC
  Administered 2017-10-01 – 2017-10-02 (×4): 80 mg via ORAL
  Filled 2017-09-30 (×5): qty 1

## 2017-09-30 MED ORDER — ACETAMINOPHEN 325 MG PO TABS: 650.0000 mg | ORAL_TABLET | ORAL | Status: DC | PRN

## 2017-09-30 MED ORDER — PHENYLEPHRINE 8 MG IN D5W 100 ML (0.08MG/ML) PREMIX OPTIME
INJECTION | INTRAVENOUS | Status: AC
  Filled 2017-09-30: qty 100

## 2017-09-30 MED ORDER — ZOLPIDEM TARTRATE 5 MG PO TABS: 5.0000 mg | ORAL_TABLET | Freq: Every evening | ORAL | Status: DC | PRN

## 2017-09-30 MED ORDER — SENNOSIDES-DOCUSATE SODIUM 8.6-50 MG PO TABS
2.0000 | ORAL_TABLET | ORAL | Status: DC
  Administered 2017-10-01 (×2): 2 via ORAL
  Filled 2017-09-30 (×2): qty 2

## 2017-09-30 MED ORDER — PHENYLEPHRINE 40 MCG/ML (10ML) SYRINGE FOR IV PUSH (FOR BLOOD PRESSURE SUPPORT)
PREFILLED_SYRINGE | INTRAVENOUS | Status: DC | PRN
  Administered 2017-09-30: 80 ug via INTRAVENOUS

## 2017-09-30 MED ORDER — IBUPROFEN 600 MG PO TABS
600.0000 mg | ORAL_TABLET | Freq: Four times a day (QID) | ORAL | Status: DC
  Administered 2017-09-30 – 2017-10-02 (×8): 600 mg via ORAL
  Filled 2017-09-30 (×8): qty 1

## 2017-09-30 MED ORDER — KETOROLAC TROMETHAMINE 30 MG/ML IJ SOLN: 30.0000 mg | Freq: Four times a day (QID) | INTRAMUSCULAR | Status: AC | PRN

## 2017-09-30 MED ORDER — ONDANSETRON HCL 4 MG/2ML IJ SOLN: 4.0000 mg | Freq: Three times a day (TID) | INTRAMUSCULAR | Status: DC | PRN

## 2017-09-30 MED ORDER — SIMETHICONE 80 MG PO CHEW
80.0000 mg | CHEWABLE_TABLET | ORAL | Status: DC
  Administered 2017-10-01 (×2): 80 mg via ORAL
  Filled 2017-09-30 (×2): qty 1

## 2017-09-30 MED ORDER — LACTATED RINGERS IV SOLN
INTRAVENOUS | Status: DC | PRN
  Administered 2017-09-30: 40 [IU] via INTRAVENOUS

## 2017-09-30 MED ORDER — SODIUM CHLORIDE 0.9% FLUSH: 3.0000 mL | INTRAVENOUS | Status: DC | PRN

## 2017-09-30 MED ORDER — NALBUPHINE HCL 10 MG/ML IJ SOLN: 5.0000 mg | Freq: Once | INTRAMUSCULAR | Status: DC | PRN

## 2017-09-30 MED ORDER — MENTHOL 3 MG MT LOZG: 1.0000 | LOZENGE | OROMUCOSAL | Status: DC | PRN

## 2017-09-30 MED ORDER — ACETAMINOPHEN 500 MG PO TABS
1000.0000 mg | ORAL_TABLET | Freq: Four times a day (QID) | ORAL | Status: AC
  Administered 2017-09-30 – 2017-10-01 (×3): 1000 mg via ORAL
  Filled 2017-09-30 (×3): qty 2

## 2017-09-30 MED ORDER — DEXAMETHASONE SODIUM PHOSPHATE 4 MG/ML IJ SOLN
INTRAMUSCULAR | Status: AC
  Filled 2017-09-30: qty 1

## 2017-09-30 MED ORDER — SCOPOLAMINE 1 MG/3DAYS TD PT72
1.0000 | MEDICATED_PATCH | Freq: Once | TRANSDERMAL | Status: DC
  Administered 2017-09-30: 1.5 mg via TRANSDERMAL
  Filled 2017-09-30: qty 1

## 2017-09-30 MED ORDER — DIPHENHYDRAMINE HCL 50 MG/ML IJ SOLN: 12.5000 mg | INTRAMUSCULAR | Status: DC | PRN

## 2017-09-30 MED ORDER — KETOROLAC TROMETHAMINE 30 MG/ML IJ SOLN
INTRAMUSCULAR | Status: AC
  Filled 2017-09-30: qty 1

## 2017-09-30 MED ORDER — MEPERIDINE HCL 25 MG/ML IJ SOLN: 6.2500 mg | INTRAMUSCULAR | Status: DC | PRN

## 2017-09-30 MED ORDER — HYDROMORPHONE HCL 1 MG/ML IJ SOLN: 0.2500 mg | INTRAMUSCULAR | Status: DC | PRN

## 2017-09-30 MED ORDER — NALBUPHINE HCL 10 MG/ML IJ SOLN: 5.0000 mg | INTRAMUSCULAR | Status: DC | PRN

## 2017-09-30 MED ORDER — LACTATED RINGERS IV SOLN
INTRAVENOUS | Status: DC | PRN
  Administered 2017-09-30: 10:00:00 via INTRAVENOUS

## 2017-09-30 MED ORDER — KETOROLAC TROMETHAMINE 30 MG/ML IJ SOLN
30.0000 mg | Freq: Once | INTRAMUSCULAR | Status: AC | PRN
  Administered 2017-09-30: 30 mg via INTRAVENOUS

## 2017-09-30 MED ORDER — LACTATED RINGERS IV SOLN
125.0000 mL/h | INTRAVENOUS | Status: DC
  Administered 2017-09-30 (×2): via INTRAVENOUS
  Administered 2017-09-30 (×2): 125 mL/h via INTRAVENOUS

## 2017-09-30 MED ORDER — PHENYLEPHRINE 8 MG IN D5W 100 ML (0.08MG/ML) PREMIX OPTIME
INJECTION | INTRAVENOUS | Status: DC | PRN
  Administered 2017-09-30: 60 ug/min via INTRAVENOUS

## 2017-09-30 MED ORDER — LACTATED RINGERS IV BOLUS
500.0000 mL | Freq: Once | INTRAVENOUS | Status: AC
  Administered 2017-09-30: 500 mL via INTRAVENOUS

## 2017-09-30 MED ORDER — OXYTOCIN 40 UNITS IN LACTATED RINGERS INFUSION - SIMPLE MED: 2.5000 [IU]/h | INTRAVENOUS | Status: AC

## 2017-09-30 MED ORDER — MORPHINE SULFATE (PF) 0.5 MG/ML IJ SOLN
INTRAMUSCULAR | Status: DC | PRN
  Administered 2017-09-30: .2 mg via INTRATHECAL

## 2017-09-30 MED ORDER — DEXAMETHASONE SODIUM PHOSPHATE 10 MG/ML IJ SOLN
INTRAMUSCULAR | Status: DC | PRN
  Administered 2017-09-30: 4 mg via INTRAVENOUS

## 2017-09-30 MED ORDER — LACTATED RINGERS IV SOLN
INTRAVENOUS | Status: DC
  Administered 2017-09-30 – 2017-10-01 (×3): via INTRAVENOUS

## 2017-09-30 MED ORDER — OXYCODONE HCL 5 MG PO TABS
5.0000 mg | ORAL_TABLET | ORAL | Status: DC | PRN
  Administered 2017-10-02 (×2): 5 mg via ORAL
  Filled 2017-09-30 (×2): qty 1

## 2017-09-30 MED ORDER — COCONUT OIL OIL: 1.0000 "application " | TOPICAL_OIL | Status: DC | PRN

## 2017-09-30 MED ORDER — FENTANYL CITRATE (PF) 100 MCG/2ML IJ SOLN
INTRAMUSCULAR | Status: AC
  Filled 2017-09-30: qty 2

## 2017-09-30 MED ORDER — SODIUM CHLORIDE 0.9 % IR SOLN
Status: DC | PRN
  Administered 2017-09-30: 1000 mL

## 2017-09-30 MED ORDER — WITCH HAZEL-GLYCERIN EX PADS: 1.0000 "application " | MEDICATED_PAD | CUTANEOUS | Status: DC | PRN

## 2017-09-30 MED ORDER — NALOXONE HCL 4 MG/10ML IJ SOLN
1.0000 ug/kg/h | INTRAVENOUS | Status: DC | PRN
  Filled 2017-09-30: qty 5

## 2017-09-30 MED ORDER — MORPHINE SULFATE (PF) 0.5 MG/ML IJ SOLN
INTRAMUSCULAR | Status: AC
  Filled 2017-09-30: qty 10

## 2017-09-30 MED ORDER — TETANUS-DIPHTH-ACELL PERTUSSIS 5-2.5-18.5 LF-MCG/0.5 IM SUSP: 0.5000 mL | Freq: Once | INTRAMUSCULAR | Status: DC

## 2017-09-30 MED ORDER — OXYTOCIN 10 UNIT/ML IJ SOLN
INTRAMUSCULAR | Status: AC
  Filled 2017-09-30: qty 4

## 2017-09-30 MED ORDER — PRENATAL MULTIVITAMIN CH
1.0000 | ORAL_TABLET | Freq: Every day | ORAL | Status: DC
  Administered 2017-10-01 – 2017-10-02 (×2): 1 via ORAL
  Filled 2017-09-30 (×2): qty 1

## 2017-09-30 MED ORDER — OXYCODONE HCL 5 MG PO TABS: 10.0000 mg | ORAL_TABLET | ORAL | Status: DC | PRN

## 2017-09-30 SURGICAL SUPPLY — 33 items
BENZOIN TINCTURE PRP APPL 2/3 (GAUZE/BANDAGES/DRESSINGS) ×3 IMPLANT
CLAMP CORD UMBIL (MISCELLANEOUS) ×3 IMPLANT
CLIP FILSHIE TUBAL LIGA STRL (Clip) ×3 IMPLANT
CLOSURE WOUND 1/2 X4 (GAUZE/BANDAGES/DRESSINGS) ×1
CLOTH BEACON ORANGE TIMEOUT ST (SAFETY) ×3 IMPLANT
DRSG OPSITE POSTOP 4X10 (GAUZE/BANDAGES/DRESSINGS) ×3 IMPLANT
DURAPREP 26ML APPLICATOR (WOUND CARE) ×3 IMPLANT
ELECT REM PT RETURN 9FT ADLT (ELECTROSURGICAL) ×3
ELECTRODE REM PT RTRN 9FT ADLT (ELECTROSURGICAL) ×1 IMPLANT
GLOVE BIOGEL PI IND STRL 7.0 (GLOVE) ×1 IMPLANT
GLOVE BIOGEL PI IND STRL 7.5 (GLOVE) ×1 IMPLANT
GLOVE BIOGEL PI INDICATOR 7.0 (GLOVE) ×2
GLOVE BIOGEL PI INDICATOR 7.5 (GLOVE) ×2
GLOVE ECLIPSE 7.5 STRL STRAW (GLOVE) ×3 IMPLANT
GOWN STRL REUS W/TWL LRG LVL3 (GOWN DISPOSABLE) ×9 IMPLANT
NS IRRIG 1000ML POUR BTL (IV SOLUTION) ×3 IMPLANT
PACK C SECTION WH (CUSTOM PROCEDURE TRAY) ×3 IMPLANT
PAD ABD 8X7 1/2 STERILE (GAUZE/BANDAGES/DRESSINGS) ×3 IMPLANT
PAD OB MATERNITY 4.3X12.25 (PERSONAL CARE ITEMS) ×3 IMPLANT
PENCIL SMOKE EVAC W/HOLSTER (ELECTROSURGICAL) ×3 IMPLANT
RTRCTR C-SECT PINK 25CM LRG (MISCELLANEOUS) ×3 IMPLANT
SPONGE GAUZE 4X4 12PLY STER LF (GAUZE/BANDAGES/DRESSINGS) ×6 IMPLANT
STRIP CLOSURE SKIN 1/2X4 (GAUZE/BANDAGES/DRESSINGS) ×2 IMPLANT
SUT VIC AB 0 CT1 27 (SUTURE) ×2
SUT VIC AB 0 CT1 27XBRD ANBCTR (SUTURE) ×1 IMPLANT
SUT VIC AB 0 CTX 36 (SUTURE) ×6
SUT VIC AB 0 CTX36XBRD ANBCTRL (SUTURE) ×3 IMPLANT
SUT VIC AB 2-0 CT1 27 (SUTURE) ×2
SUT VIC AB 2-0 CT1 TAPERPNT 27 (SUTURE) ×1 IMPLANT
SUT VIC AB 4-0 KS 27 (SUTURE) ×3 IMPLANT
TAPE CLOTH SURG 4X10 WHT LF (GAUZE/BANDAGES/DRESSINGS) ×3 IMPLANT
TOWEL OR 17X24 6PK STRL BLUE (TOWEL DISPOSABLE) ×3 IMPLANT
TRAY FOLEY W/BAG SLVR 14FR LF (SET/KITS/TRAYS/PACK) ×3 IMPLANT

## 2017-09-30 NOTE — Addendum Note (Signed)
Addendum  created 09/30/17 1842 by Algis GreenhouseBurger, Nicolae Vasek A, CRNA   Sign clinical note

## 2017-09-30 NOTE — Anesthesia Procedure Notes (Signed)
Spinal  Start time: 09/30/2017 9:30 AM End time: 09/30/2017 9:33 AM Staffing Anesthesiologist: [name] Hale, [name] Baumgartner, MD Performed: anesthesiologist  Preanesthetic Checklist Completed: patient identified, site marked, surgical consent, pre-op evaluation, timeout performed, IV checked, risks and benefits discussed and monitors and equipment checked Spinal Block Patient position: sitting Prep: site prepped and draped and DuraPrep Patient monitoring: continuous pulse ox and blood pressure Approach: midline Location: L3-4 Injection technique: single-shot Needle Needle type: Pencan  Needle length: 10 cm Needle insertion depth: 6 cm Assessment Sensory level: T4 Events: paresthesia Additional Notes L leg X 1

## 2017-09-30 NOTE — Op Note (Addendum)
Operative Report  PATIENT: [name] Hale  PROCEDURE DATE: 09/30/2017  PREOPERATIVE DIAGNOSES: Intrauterine pregnancy at 1451w1d weeks gestation; Elective Repeat; Undesired fertility  POSTOPERATIVE DIAGNOSES: The same  PROCEDURE: Repeat Low Transverse Cesarean Section; Bilateral Tubal Sterilization using Filshie clips  SURGEON:   Surgeon(s) and Role:    * Levie HeritageStinson, Jacob J, DO - Primary - Attending   Marcy Sirenatherine Wallace, DO- OB Fellow    Nolene EbbsJulie Malea Swilling, MD - OB Fellow   INDICATIONS: [name] Hale is a 37 y.o. [id number] at 6051w1d here for cesarean section secondary to the indications listed under preoperative diagnoses; please see preoperative note for further details.  The risks of cesarean section were discussed with the patient including but were not limited to: bleeding which may require transfusion or reoperation; infection which may require antibiotics; injury to bowel, bladder, ureters or other surrounding organs; injury to the fetus; need for additional procedures including hysterectomy in the event of a life-threatening hemorrhage; placental abnormalities wth subsequent pregnancies, incisional problems, thromboembolic phenomenon and other postoperative/anesthesia complications.   The patient concurred with the proposed plan, giving informed written consent for the procedure.    FINDINGS:  Viable female infant in cephalic presentation, LOA.  Apgars 8 and 9.  Clear amniotic fluid.  Intact placenta, three vessel cord.  Normal uterus, fallopian tubes and ovaries bilaterally.  ANESTHESIA: Spinal INTRAVENOUS FLUIDS:1800 ESTIMATED BLOOD LOSS: 918 mL URINE OUTPUT:  400 ml SPECIMENS: Placenta sent to L&D COMPLICATIONS: None immediate  PROCEDURE IN DETAIL:  The patient preoperatively received intravenous antibiotics and had sequential compression devices applied to her lower extremities.  She was then taken to the operating room where spinal anesthesia was administered.  She was then  placed in a dorsal supine position with a leftward tilt, and prepped and draped in a sterile manner.  A foley catheter was placed into her bladder and attached to constant gravity.    After an adequate timeout was performed, a Pfannenstiel skin incision was made with scalpel over her preexisting scar and carried through to the underlying layer of fascia. The fascia was incised in the midline, and this incision was extended bilaterally using the Mayo scissors.  Kocher clamps were applied to the superior aspect of the fascial incision and the underlying rectus muscles were dissected off bluntly.  A similar process was carried out on the inferior aspect of the fascial incision. The rectus muscles were separated in the midline bluntly and the peritoneum was entered bluntly. Attention was turned to the lower uterine segment where a low transverse hysterotomy was made with a scalpel and extended bilaterally bluntly.  The infant was successfully delivered, the cord was clamped and cut after one minute, and the infant was handed over to the awaiting neonatology team. Uterine massage was then administered, and the placenta delivered intact with a three-vessel cord. The uterus was then cleared of clots and debris.  The hysterotomy was closed with 0 Vicryl in a running locked fashion, and an imbricating layer was also placed with 0 Vicryl.   Attention was then turned to the fallopian tubes, and Filshie clips were placed about 3 cm from the cornua, with care given to incorporate the underlying mesosalpinx on both sides, allowing for bilateral tubal sterilization. The pelvis was cleared of all clot and debris. Hemostasis was confirmed on all surfaces.  The peritoneum was closed with a 2-0 Vicryl running stitch. The fascia was then closed using 0 Vicryl in a running fashion. The skin was closed with a 4-0 Vicryl subcuticular stitch.  The patient tolerated the procedure well. Sponge, lap, instrument and needle counts were  correct x 3.  She was taken to the recovery room in stable condition.   Disposition: PACU - hemodynamically stable.   Maternal Condition: stable   Marcy Siren, D.O. OB Fellow  09/30/2017, 11:20 AM

## 2017-09-30 NOTE — Lactation Note (Signed)
This note was copied from a baby's chart. Lactation Consultation Note  Patient Name: [name] Hale ZOXWR'UToday's Date: 09/30/2017 Reason for consult: Initial assessment;Term  6 hours old female who is being exclusively BF by his mother, she's a P2 and experienced BF. She was able to BF her first child for 18 months but had some breastfeeding difficulties, per mom baby would not latch on, she had to work to get her to latch. Mom already knows how to hand express, but she doesn't have a pump at home, [facility], pump instructions, cleaning and storage were reviewed, as well as milk storage guidelines.  Mom was putting baby to the breast when entering the room, LC assisted with latch and advised mom to un-swadle baby and do STS instead, shed did. LC took baby to the right breast in football position and baby was able to latch almost immediately but LC had to assist repositioning baby every so often because he wasn't able to sustain the latch, kept coming off, but as soon as he did he kept rooting for the breast; only a couple of swallows were heard.  When doing hand expression on the other breast, colostrum just poured off, reviewed treatment for sore nipples. Noticed that mom has inverted nipples, set her up with breast shells, mom brought a nursing bra to the hospital, shells instructions, cleaning and storage was also reviewed. Also advised mom to use her hand pump for pre-pumping to help evert her nipples. RN mentioned that mom had already requested formula, did some LEAD education and explained about baby's nutritional needs during the first 24 hours and the size of his stomach. Discussed the onset of lactogenesis II.  Encouraged mom to feed baby 8-12 times/24 hours or sooner if feeding cues are present. She'll start wearing her shells in between feeding and pre-pump. She'll also spoon feed baby any amount of EBM she may get (got a few drops when showing her how to  use her hand pump). Reviewed BF brochure (SP), BF resources and feeding diary (SP); mom is aware of LC services and will call PRN.   Maternal Data Formula Feeding for Exclusion: No Has patient been taught Hand Expression?: Yes Does the patient have breastfeeding experience prior to this delivery?: Yes  Feeding Feeding Type: Breast Fed Length of feed: 19 min(on and off, had to relatch baby several times)  LATCH Score Latch: Repeated attempts needed to sustain latch, nipple held in mouth throughout feeding, stimulation needed to elicit sucking reflex.  Audible Swallowing: A few with stimulation(only 2, but colostrum easily expressed from the other breast while baby was nursing)  Type of Nipple: Inverted  Comfort (Breast/Nipple): Soft / non-tender  Hold (Positioning): Assistance needed to correctly position infant at breast and maintain latch.  LATCH Score: 5  Interventions Interventions: Breast feeding basics reviewed;Assisted with latch;Skin to skin;Breast massage;Hand express;Breast compression;Adjust position;Reverse pressure;Hand pump;Shells;Support pillows  Lactation Tools Discussed/Used Tools: Shells;Pump Shell Type: Inverted Breast pump type: Manual WIC Program: No Pump Review: Setup, frequency, and cleaning;Milk Storage Initiated by:: MPeck Date initiated:: 09/30/17   Consult Status Consult Status: Follow-up Date: 10/01/17 Follow-up type: In-patient    Bryant Lipps [name] Hale 09/30/2017, 4:41 PM

## 2017-09-30 NOTE — Transfer of Care (Signed)
Immediate Anesthesia Transfer of Care Note  [facility]  Procedure(s) Performed: REPEAT CESAREAN SECTION WITH BILATERAL TUBAL LIGATION (N/A Abdomen)  Patient Location: PACU  Anesthesia Type:Spinal  Level of Consciousness: awake, alert  and oriented  Airway & Oxygen Therapy: Patient Spontanous Breathing  Post-op Assessment: Report given to RN and Post -op Vital signs reviewed and stable  Post vital signs: Reviewed and stable  Last Vitals:  Vitals Value Taken Time  BP    Temp    Pulse 75 09/30/2017 11:27 AM  Resp 14 09/30/2017 11:27 AM  SpO2 99 % 09/30/2017 11:27 AM  Vitals shown include unvalidated device data.  Last Pain:  Vitals:   09/30/17 0746  TempSrc: Oral  PainSc: 0-No pain      Patients Stated Pain Goal: 4 (09/30/17 0746)  Complications: No apparent anesthesia complications

## 2017-09-30 NOTE — Anesthesia Preprocedure Evaluation (Signed)
Anesthesia Evaluation  Patient identified by MRN, date of birth, ID band Patient awake    Reviewed: Allergy & Precautions, H&P , NPO status , Patient's Chart, lab work & pertinent test results  Airway Mallampati: I  TM Distance: >3 FB Neck ROM: full    Dental no notable dental hx. (+) Teeth Intact   Pulmonary neg pulmonary ROS,    Pulmonary exam normal breath sounds clear to auscultation       Cardiovascular negative cardio ROS Normal cardiovascular exam Rhythm:regular Rate:Normal     Neuro/Psych negative neurological ROS     GI/Hepatic negative GI ROS, Neg liver ROS,   Endo/Other  negative endocrine ROS  Renal/GU negative Renal ROS     Musculoskeletal negative musculoskeletal ROS (+)   Abdominal (+) + obese,   Peds  Hematology negative hematology ROS (+)   Anesthesia Other Findings   Reproductive/Obstetrics (+) Pregnancy                             Anesthesia Physical Anesthesia Plan  ASA: II  Anesthesia Plan: Spinal   Post-op Pain Management:    Induction:   PONV Risk Score and Plan: 3 and Ondansetron, Dexamethasone and Scopolamine patch - Pre-op  Airway Management Planned: Nasal Cannula and Natural Airway  Additional Equipment:   Intra-op Plan:   Post-operative Plan:   Informed Consent: I have reviewed the patients History and Physical, chart, labs and discussed the procedure including the risks, benefits and alternatives for the proposed anesthesia with the patient or authorized representative who has indicated his/her understanding and acceptance.     Plan Discussed with: CRNA and Surgeon  Anesthesia Plan Comments:         Anesthesia Quick Evaluation

## 2017-09-30 NOTE — Lactation Note (Signed)
This note was copied from a baby's chart. Lactation Consultation Note  Patient Name: [name] Hale [name] Hale Reason for consult: Follow-up assessment;Term  RN called for assistance mom is SP speaking and she was worried about her baby not latching, baby had a large emesis according to mom, per mom it was about 3 teaspoons 15 cc and baby was coughing but no change in skin color. Baby seems to be juicy possibly due to C/S delivery; asked mom to call her RN for assistance if they needed help to use the bulb syringe.  RN was also concerned that baby hasn't eaten since 4 pm and request a NS for mom, when LC talked to mom about using a NS, mom voiced she used one with her first child. Set her up with a # 20 NS but this time mom did not wish to wake her baby up to assess the feeding, she wanted to let him sleep. Mom will try to wake him up at midnight and will call her RN for latch assistance to use the NS # 20 for the first time; mom is aware there is no LC overnight tonight. Reviewed NS instructions, cleaning and storage.  Mom was also told she needs to pump every 3 hours if she's going to be using a NS, but she said she's not ready to start pumping tonight, but she will tomorrow. Mom is not sure if she's going to do a DEBP or just her hand pump; she'll let her RN know before she sets up a pump. Mom aware she needs to protect her milk supply while on NS. Will reassess again tomorrow.  Maternal Data    Feeding Feeding Type: Breast Fed  LATCH Score Latch: Too sleepy or reluctant, no latch achieved, no sucking elicited.  Audible Swallowing: None  Type of Nipple: Flat  Comfort (Breast/Nipple): Soft / non-tender  Hold (Positioning): Assistance needed to correctly position infant at breast and maintain latch.  LATCH Score: 4  Interventions Interventions: Breast feeding basics reviewed  Lactation Tools Discussed/Used Tools: Nipple Shields Nipple shield size:  20   Consult Status Consult Status: Follow-up Date: 10/01/17 Follow-up type: In-patient    [name] Hale Hale, 11:16 PM

## 2017-09-30 NOTE — Anesthesia Postprocedure Evaluation (Signed)
Anesthesia Post Note  Patient: Engineer, maintenance (IT)[name] Hale  Procedure(s) Performed: REPEAT CESAREAN SECTION WITH BILATERAL TUBAL LIGATION (N/A Abdomen)     Patient location during evaluation: PACU Anesthesia Type: Spinal Level of consciousness: awake Pain management: pain level controlled Vital Signs Assessment: post-procedure vital signs reviewed and stable Respiratory status: spontaneous breathing Cardiovascular status: stable Postop Assessment: no headache, no backache, spinal receding, patient able to bend at knees and no apparent nausea or vomiting Anesthetic complications: no    Last Vitals:  Vitals:   09/30/17 1240 09/30/17 1302  BP:  (!) 95/57  Pulse: 74 64  Resp: 18 18  Temp:    SpO2: 98% 100%    Last Pain:  Vitals:   09/30/17 1240  TempSrc:   PainSc: 0-No pain   Pain Goal: Patients Stated Pain Goal: 4 (09/30/17 1230)               [name],[name]

## 2017-09-30 NOTE — Anesthesia Postprocedure Evaluation (Signed)
Anesthesia Post Note  Patient: Engineer, maintenance (IT)Vivia Morales-Canil  Procedure(s) Performed: REPEAT CESAREAN SECTION WITH BILATERAL TUBAL LIGATION (N/A Abdomen)     Patient location during evaluation: Mother Baby Anesthesia Type: Spinal Level of consciousness: awake Pain management: satisfactory to patient Vital Signs Assessment: post-procedure vital signs reviewed and stable Respiratory status: spontaneous breathing Cardiovascular status: stable Anesthetic complications: no    Last Vitals:  Vitals:   09/30/17 1600 09/30/17 1700  BP:    Pulse: 76 73  Resp:    Temp:    SpO2: 97% 96%    Last Pain:  Vitals:   09/30/17 1400  TempSrc: Oral  PainSc:    Pain Goal: Patients Stated Pain Goal: 4 (09/30/17 1230)               Cephus ShellingBURGER,Teller [place]
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
## 2017-10-01 ENCOUNTER — Encounter: Payer: Medicaid Other | Admitting: Student

## 2017-10-01 LAB — CBC
HEMATOCRIT: 27.8 % — AB (ref 36.0–46.0)
Hemoglobin: 9.6 g/dL — ABNORMAL LOW (ref 12.0–15.0)
MCH: 31.3 pg (ref 26.0–34.0)
MCHC: 34.9 g/dL (ref 30.0–36.0)
MCV: 89.7 fL (ref 78.0–100.0)
Platelets: 214 10*3/uL (ref 150–400)
RBC: 3.1 MIL/uL — AB (ref 3.87–5.11)
RDW: 13.8 % (ref 11.5–15.5)
WBC: 14.9 10*3/uL — ABNORMAL HIGH (ref 4.0–10.5)

## 2017-10-01 LAB — CREATININE, SERUM: CREATININE: 0.49 mg/dL (ref 0.44–1.00)

## 2017-10-01 NOTE — Progress Notes (Signed)
Status Interpreter used to communicate with patient. Celina ID #: [id number] was used. Patient was updated on breastfeeding, plan of care and assessments. Patent verbalized understanding. Will continue to monitor.
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
## 2017-10-01 NOTE — Progress Notes (Signed)
Stratus Interpreter Will id: [id number] used. Patient was assisted to the bathroom, standby assist. Patient denies any dizziness or lightheadedness.

## 2017-10-01 NOTE — Progress Notes (Addendum)
Patient ID: [name] Hale, female   DOB: 19-Jun-1980, 37 y.o.   MRN: [id number] POSTPARTUM PROGRESS NOTE  Post Partum Day 1 Subjective:  [name] Hale is a 37 y.o. [id number] 6766w1d s/p rLTCS and BTL.  No acute events overnight.  Pt denies problems with ambulating, voiding or po intake. Patient endorses flatulence but hasn't had a BM yet. She denies nausea or vomiting.  Pain is well controlled. Lochia Small and described as a little more than a period but is getting lighter.   Objective: Blood pressure 103/63, pulse 66, temperature 98.5 F (36.9 C), temperature source Oral, resp. rate 18, weight 70.3 kg (154 lb 14.4 oz), last menstrual period 01/09/2017, SpO2 99 %, unknown if currently breastfeeding.  Physical Exam:  General: alert, cooperative and no distress Lochia:normal flow Chest: no respiratory distress Heart:regular rate, distal pulses intact Abdomen: soft, nontender,  Uterine Fundus: firm, appropriately tender DVT Evaluation: No calf swelling or tenderness Extremities: no edema  Recent Labs    09/29/17 1110 10/01/17 0550  HGB 12.6 9.6*  HCT 37.3 27.8*    Assessment/Plan:  ASSESSMENT: [name] Hale is a 37 y.o. [id number] 9066w1d s/p rTLCS and BTL  Feeding: Bottle and Breast Contraception: BTL done with C/S Circumcision: None Plan to discharge tomorrow   LOS: 1 day   [name] Hale 10/01/2017, 7:27 AM   I confirm that I have verified the information documented in the physician assistant student's note and that I have also personally reperformed the physical exam and all medical decision making activities.  Patient up and walking around room. Reports pain is well controlled with no concerns. Pressure dressing in place. Scant lochia noted on pad. Fundus firm and appropriately tender.   Plan for discharge tomorrow   [name] Hale, CNM 10/01/17, 2:03 PM

## 2017-10-02 ENCOUNTER — Encounter (HOSPITAL_COMMUNITY): Payer: Self-pay | Admitting: Family Medicine

## 2017-10-02 DIAGNOSIS — Z9851 Tubal ligation status: Secondary | ICD-10-CM

## 2017-10-02 MED ORDER — OXYCODONE HCL 5 MG PO TABS: 5.0000 mg | ORAL_TABLET | ORAL | 0 refills | Status: DC | PRN

## 2017-10-02 MED ORDER — SENNOSIDES-DOCUSATE SODIUM 8.6-50 MG PO TABS: 2.0000 | ORAL_TABLET | ORAL | 0 refills | Status: DC

## 2017-10-02 MED ORDER — IBUPROFEN 600 MG PO TABS: 600.0000 mg | ORAL_TABLET | Freq: Four times a day (QID) | ORAL | 0 refills | Status: DC

## 2017-10-02 NOTE — Discharge Instructions (Signed)
Cuidados en el postparto luego de un parto por cesrea (Postpartum Care After Cesarean Delivery) El perodo de tiempo que sigue inmediatamente al parto se conoce como puerperio. Center?  Podra continuar recibiendo medicamentos y lquidos travs de una va intravenosa (IV) que se Glass blower/designer en una de sus venas.  Probablemente tenga colocado un tubo flexible de drenaje (catter) que drenar la orina de la vejiga. El catter ser retirado tan pronto como sea apropiado.  Es posible que le den una botella rociadora para que use cuando vaya al bao. Puede utilizarla hasta que se sienta cmoda limpindose de la manera habitual. Siga los pasos a continuacin para usar la botella rociadora: ? Antes de orinar, llene la botella rociadora con agua tibia. El agua debe estar tibia. No use agua caliente. ? Despus de Garment/textile technologist, [place] an est sentada en el inodoro, use la botella rociadora para enjuagar el rea alrededor de la uretra y la abertura vaginal. Con esto podr limpiar cualquier rastro de orina y [place]. ? Puede hacer esto en lugar de secarse. Cuando comience a Scientist, research (medical), podr usar la botella rociadora antes de secarse. Asegrese de secarse suavemente. ? Llene la botella rociadora con agua limpia cada vez que vaya al bao.  Deber usar apsitos sanitarios.  Le controlarn la incisin para asegurarse de que est cicatrizando correctamente. Le indicarn cuando es seguro retirar los puntos, las grapas o la cinta adhesiva para la piel. QU PUEDO ESPERAR?  Quizs no tenga necesidad de orinar durante varias horas despus del parto.  Sentir algo de dolor y [place] en el abdomen. Puede tener una pequea secrecin de sangre o de lquido transparente que proviene de la incisin.  Si est amamantando, podra tener contracciones uterinas cada vez que lo haga. Estas podran prolongarse hasta varias semanas durante el puerperio. Las contracciones uterinas ayudan al tero a  Stage manager a su tamao habitual.  Es normal tener un poco de hemorragia vaginal (loquios) despus del [place]. La cantidad y apariencia de los loquios a menudo es similar a las del perodo menstrual la primera semana despus del [place]. Disminuir gradualmente las siguientes semanas hasta convertirse en una descarga seca amarronada o [place]. En la [employer], los loquios se detienen [employer] 6 a 8semanas despus del Muse. Los sangrados vaginales pueden variar de mujer a Art therapist.  Los primeros das despus del parto, podra padecer [place]. Los pechos se sentirn pesados, llenos y molestos. Las mamas tambin podran latir y ponerse duras, muy tirantes, calientes y sensibles al tacto. Cuando esto [place], podra notar [employer] se escapa de los senos.El mdico puede recomendarle algunos mtodos para [place] causado por la [place]. La congestin mamaria debera desaparecer al cabo de [employer].  Podra sentirse ms deprimida o preocupada que lo habitual debido a los cambios hormonales luego del [place]. Estos sentimientos no deben durar ms de [employer]. Si no desaparecen al cabo de [employer], hable con su [place]?  Infrmele a su mdico si siente dolor o malestar.  Beba suficiente agua para mantener la orina clara o de color amarillo plido.  Lvese bien las manos con agua y jabn durante al menos 20segundos despus de cambiar el apsito sanitario, usar el bao y antes de Nature conservation officer o Research scientist (life sciences) al beb.  Si no est amamantando, evite tocarse mucho los senos. Al hacerlo, podran producir ms [employer].  Si se siente dbil o mareada, o si siente que est a punto de  desmayarse, pida ayuda antes de realizar lo siguiente: ? Levantarse de la cama. ? Ducharse.  Cambie los apsitos sanitarios con frecuencia. Observe si hay cambios en el flujo, como un aumento repentino en el volumen, cambios en el color o cogulos  sanguneos de gran tamao. Si expulsa un cogulo sanguneo por la vagina, gurdelo para mostrrselo a su mdico. No tire la cadena sin que el mdico examine el cogulo antes.  Asegrese de tener todas las vacunas al da. Esto la ayudar a Theme park manager protegida y a proteger al beb de determinadas enfermedades. Podra necesitar vacunas antes de dejar el hospital.  Si lo desea, hable con el mdico acerca de los mtodos de planificacin familiar o control de la natalidad (mtodos anticonceptivos). CMO PUEDO ESTABLECER LAZOS CON MI BEB? Pasar tanto tiempo como le sea posible con el beb es sumamente importante. Durante ese tiempo, usted y su beb pueden conocerse y Theme park manager. Tener al beb con usted en la habitacin le dar tiempo de conocerlo. Esto tambin puede hacerla sentir ms cmoda para atender al beb. Amamantar tambin puede ayudarla a crear lazos con el beb. CMO PUEDO PLANIFICAR MI REGRESO A CASA CON EL BEB?  Asegrese de tener instalada una butaca en el automvil. ? La butaca debe contar con la certificacin del fabricante para asegurarse de que est instalada en forma segura. ? Asegrese de que el beb quede bien asegurado en la butaca.  Pregntele al mdico todo lo que necesite saber sobre los cuidados de su beb. Asegrese de poder comunicarse con el mdico en caso de que tenga preguntas luego de dejar el hospital. Esta informacin no tiene Theme park manager el consejo del mdico. Asegrese de hacerle al mdico cualquier pregunta que tenga. Document Released: 02/12/2012 Document Revised: 06/19/2015 Document Reviewed: 01/30/2015 Elsevier Interactive Patient Education  2018 [employer]. Raub de trompas posparto, cuidados posteriores (Postpartum Tubal Ligation, Care After) Siga estas instrucciones durante las prximas semanas. Estas indicaciones le proporcionan informacin acerca de cmo deber cuidarse despus del procedimiento. El mdico tambin podr darle instrucciones  ms especficas. El tratamiento ha sido planificado segn las prcticas mdicas actuales, pero en algunos casos pueden ocurrir problemas. Comunquese con el mdico si tiene algn problema o dudas despus del procedimiento. QU ESPERAR DESPUS DEL PROCEDIMIENTO Despus del procedimiento, es comn DIRECTV siguientes sntomas:  Dolor de Advertising copywriter.  Hematomas o dolor en la espalda.  Nuseas o vmitos.  Mareos.  Dolor o molestias abdominales leves, como clicos, dolor por gases o sensacin de hinchazn.  Dolor en Immunologist donde se hizo la incisin.  Cansancio.  Dolor en los hombros. INSTRUCCIONES PARA EL CUIDADO EN EL HOGAR Medicamentos  [employer] medicamentos de venta libre y los recetados solamente como se lo haya indicado el mdico.  No tome aspirina, ya que puede causar hemorragias.  No conduzca ni opere maquinaria pesada mientras toma analgsicos recetados. Actividad  Haga reposo durante el resto del da.  Retome gradualmente sus [employer].  No mantenga relaciones sexuales, no se haga duchas vaginales ni se coloque un tampn o cualquier otro objeto en la vagina durante 6semanas o el tiempo que le haya indicado el mdico.  No levante nada que pese ms que el beb durante 2semanas o durante el tiempo que le haya indicado el mdico. Cuidado de la incisin  Siga las indicaciones del mdico acerca del cuidado de la incisin. Haga lo siguiente: ? Lvese las manos con agua y jabn antes de Multimedia programmer las vendas (vendaje). Use  desinfectante para manos si no dispone de [place] y [place]. ? Cambie el vendaje como se lo haya indicado el mdico. ? No retire los puntos (suturas). Tal vez deban dejarse puestos en la piel durante 2semanas o ms tiempo.  Controle todos los das la zona de la incisin para detectar signos de infeccin. Est atento a los siguientes signos: ? Aumento del enrojecimiento, la hinchazn o Chief Technology Officerel dolor. ? Ms lquido [place]  sangre. ? Calor. ? Pus o mal olor. Otras indicaciones  No tome baos de inmersin, no nade ni use el jacuzzi hasta que el mdico lo autorice. Puede ducharse.  Concurra a todas las visitas de control como se lo haya indicado el mdico. Esto es importante. SOLICITE ATENCIN MDICA SI:  Aumentan el enrojecimiento, la hinchazn o el dolor alrededor de la incisin.  La incisin est caliente al tacto.  Tiene pus o percibe que sale mal olor del lugar de la incisin.  Se le abren los bordes de la incisin despus de que le hayan sacado las suturas.  El dolor no mejora despus de 2a 3das.  Tiene una erupcin cutnea.  Tiene mareos o sensacin de desvanecimiento de forma repetida.  El medicamento no IT trainerle calma el dolor.  Tiene estreimiento. SOLICITE ATENCIN MDICA DE INMEDIATO SI:  Tiene fiebre.  Se desmaya.  Tiene dolor abdominal que empeora.  Observa lquido o sangre que sale de las suturas.  Le falta el aire o tiene dificultad para respirar.  Siente dolor en el pecho o en las piernas.  Tiene nuseas o diarrea de forma continua. Esta informacin no tiene Theme park managercomo fin reemplazar el consejo del mdico. Asegrese de hacerle al mdico cualquier pregunta que tenga. Document Released: 08/27/2011 Document Revised: 06/19/2015 Document Reviewed: 02/05/2015 Elsevier Interactive Patient Education  [employer]2018 Elsevier Inc.
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
## 2017-10-15 ENCOUNTER — Ambulatory Visit (INDEPENDENT_AMBULATORY_CARE_PROVIDER_SITE_OTHER): Payer: Medicaid Other | Admitting: General Practice

## 2017-10-15 VITALS — BP 109/72 | HR 84 | Ht <= 58 in | Wt 137.0 lb

## 2017-10-15 DIAGNOSIS — Z5189 Encounter for other specified aftercare: Secondary | ICD-10-CM

## 2017-10-15 NOTE — Progress Notes (Addendum)
Patient presents to office today for wound check following repeat c-section two weeks ago. Incision is clean, dry & intact. Appears to be healing well with steri strips intact. Reviewed wound care and signs/symptoms of infection with patient. Patient verbalized understanding & reports hip/back pain since anesthesia. Recommended continued use of motrin & heating pads and to discuss with provider in a couple weeks at pp visit if pain is still present. Patient verbalized understanding and had no other questions.  Attestation of Attending Supervision of RN: Evaluation and management procedures were performed by the nurse under my supervision and collaboration.  I have reviewed the nursing note and chart, and I agree with the management and plan.  Carolyn L. Harraway-Smith, M.D., Evern CoreFACOG

## 2017-10-24 ENCOUNTER — Ambulatory Visit (INDEPENDENT_AMBULATORY_CARE_PROVIDER_SITE_OTHER): Payer: Medicaid Other | Admitting: *Deleted

## 2017-10-24 VITALS — BP 105/74 | HR 69 | Ht <= 58 in | Wt 136.5 lb

## 2017-10-24 DIAGNOSIS — Z5189 Encounter for other specified aftercare: Secondary | ICD-10-CM

## 2017-10-24 MED ORDER — IBUPROFEN 600 MG PO TABS: 600.0000 mg | ORAL_TABLET | Freq: Four times a day (QID) | ORAL | 1 refills | Status: DC

## 2017-10-24 NOTE — Progress Notes (Signed)
Interpreter Viviana SimplerAlis Hale present for encounter. Pt reports constipation and no BM x1 week. She started taking Miralax yesterday. I advised pt to continue Miralax daily and re-start Colace twice daily until normal BM's return, then continue with just Colace. She also reports pain in abdomen and back which began yesterday - pain scale 7. She is taking ibuprofen and desires Rx refill - sent as requested. C/S incision examined and found to be well-healed without swelling, redness, bleeding or discharge. Steri-strips were still in place and removed. Education for proper hygiene of incisional area given. Pt voiced understanding of all information and instructions given and has PP appt on 8/23.

## 2017-10-28 NOTE — Progress Notes (Signed)
I have reviewed the chart and agree with nursing staff's documentation of this patient's encounter.  Sharyon CableVeronica C Rosaria Kubin, CNM 10/28/2017 2:13 AM

## 2017-10-31 ENCOUNTER — Encounter: Payer: Self-pay | Admitting: Obstetrics and Gynecology

## 2017-10-31 ENCOUNTER — Ambulatory Visit (INDEPENDENT_AMBULATORY_CARE_PROVIDER_SITE_OTHER): Payer: Medicaid Other | Admitting: Clinical

## 2017-10-31 ENCOUNTER — Ambulatory Visit (INDEPENDENT_AMBULATORY_CARE_PROVIDER_SITE_OTHER): Payer: Medicaid Other | Admitting: Obstetrics and Gynecology

## 2017-10-31 VITALS — BP 107/71 | HR 71 | Ht 59.0 in | Wt 135.0 lb

## 2017-10-31 DIAGNOSIS — F4323 Adjustment disorder with mixed anxiety and depressed mood: Secondary | ICD-10-CM

## 2017-10-31 DIAGNOSIS — Z302 Encounter for sterilization: Secondary | ICD-10-CM

## 2017-10-31 DIAGNOSIS — M5441 Lumbago with sciatica, right side: Secondary | ICD-10-CM

## 2017-10-31 DIAGNOSIS — Z9889 Other specified postprocedural states: Secondary | ICD-10-CM

## 2017-10-31 DIAGNOSIS — G8929 Other chronic pain: Secondary | ICD-10-CM

## 2017-10-31 DIAGNOSIS — M5442 Lumbago with sciatica, left side: Secondary | ICD-10-CM

## 2017-10-31 DIAGNOSIS — R300 Dysuria: Secondary | ICD-10-CM

## 2017-10-31 MED ORDER — CEPHALEXIN 500 MG PO CAPS: 500.0000 mg | ORAL_CAPSULE | Freq: Four times a day (QID) | ORAL | 0 refills | Status: DC

## 2017-10-31 NOTE — Progress Notes (Signed)
error 

## 2017-10-31 NOTE — BH Specialist Note (Signed)
Integrated Behavioral Health Initial Visit  MRN: [id number] Name: [name] Hale  Number of Integrated Behavioral Health Clinician visits:: 1/6 Session Start time: 11:45  Session End time: 12:04 Total time: 20 minutes  Type of Service: Integrated Behavioral Health- Individual/Family Interpretor:Yes.   Interpretor Name and Language: Spanish   Warm Hand Off Completed.       SUBJECTIVE: [name] Hale is a 37 y.o. female accompanied by Children(5yo daughter; newborn son) Patient was referred by Leroy LibmanKelly Davis, MD for chronic pain increasing symptoms of anxiety/depression. Patient reports the following symptoms/concerns: Pt states her primary concern today is feeling guilty and worrying about the effect her back pain has on her ability to care for her children; worries that if she picks the baby up during back pain, she could accidentally drop him. Pt experienced similar feelings after her daughter was born.  Duration of problem: Increase postpartum; Severity of problem: moderate  OBJECTIVE: Mood: Anxious and Affect: Appropriate Risk of harm to self or others: No plan to harm self or others  LIFE CONTEXT: Family and Social: Pt lives with her husband and children (5yo daughter; newborn son) School/Work: Husband works Self-Care: - Life Changes: Recent childbirth with increase in backpain  GOALS ADDRESSED: Patient will: 1. Reduce symptoms of: anxiety and depression 2. Increase knowledge and/or ability of: healthy habits  3. Demonstrate ability to: Increase healthy adjustment to current life circumstances  INTERVENTIONS: Interventions utilized: Solution-Focused Strategies and Psychoeducation and/or Health Education  Standardized Assessments completed: GAD-7 and PHQ 9  ASSESSMENT: Patient currently experiencing Adjustment disorder with mixed anxious and depressed mood.   Patient may benefit from psychoeducation and brief therapeutic interventions regarding coping with  symptoms of anxiety and depression .  PLAN: 1. Follow up with behavioral health clinician on :  2. Behavioral recommendations:  -Continue taking prenatal vitamins, as recommended by medical provider -Consider [facility] care [facility] (Spanish-speaking counselors) for ongoing therapy -Consider self-coping strategies, as discussed in office visit -Read educational materials regarding coping with symptoms of anxiety, depression, and chronic pain  3. Referral(s): Integrated Behavioral Health Services (In Clinic) 4. "From scale of 1-10, how likely are you to follow plan?": 9  Rae LipsJamie C McMannes, LCSW  Depression screen [facility] 2/9 09/24/2017 09/18/2017 09/05/2017 08/22/2017 08/07/2017  Decreased Interest 0 0 0 0 0  Down, Depressed, Hopeless 0 0 0 0 0  PHQ - 2 Score 0 0 0 0 0  Altered sleeping 0 0 0 0 0  Tired, decreased energy 0 0 0 0 0  Change in appetite 0 0 0 0 0  Feeling bad or failure about yourself  0 0 0 0 0  Trouble concentrating 0 0 0 0 0  Moving slowly or fidgety/restless 0 0 0 0 0  Suicidal thoughts 0 0 0 0 0  PHQ-9 Score 0 0 0 0 0  Some recent data might be hidden   GAD 7 : Generalized Anxiety Score 09/24/2017 09/18/2017 09/05/2017 08/22/2017  Nervous, Anxious, on Edge 0 0 0 0  Control/stop worrying 0 0 0 0  Worry too much - different things 0 0 0 0  Trouble relaxing 0 0 0 0  Restless 0 0 0 0  Easily annoyed or irritable 0 0 0 0  Afraid - awful might happen 0 0 0 0  Total GAD 7 Score 0 0 0 0
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
## 2017-10-31 NOTE — BH Specialist Note (Signed)
error 

## 2017-11-07 ENCOUNTER — Telehealth: Payer: Self-pay | Admitting: Clinical
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
## 2018-02-24 ENCOUNTER — Other Ambulatory Visit: Payer: Self-pay | Admitting: Student

## 2018-02-24 ENCOUNTER — Ambulatory Visit: Payer: Medicaid Other | Admitting: Student

## 2018-02-24 ENCOUNTER — Ambulatory Visit (INDEPENDENT_AMBULATORY_CARE_PROVIDER_SITE_OTHER): Payer: Medicaid Other | Admitting: Clinical

## 2018-02-24 VITALS — BP 109/72 | HR 96 | Wt 130.2 lb

## 2018-02-24 DIAGNOSIS — F4323 Adjustment disorder with mixed anxiety and depressed mood: Secondary | ICD-10-CM | POA: Diagnosis not present

## 2018-02-24 DIAGNOSIS — F53 Postpartum depression: Principal | ICD-10-CM

## 2018-02-24 DIAGNOSIS — O99345 Other mental disorders complicating the puerperium: Secondary | ICD-10-CM

## 2018-02-24 MED ORDER — SERTRALINE HCL 50 MG PO TABS: 50.0000 mg | ORAL_TABLET | Freq: Every day | ORAL | 1 refills | Status: DC

## 2018-02-24 MED ORDER — FERROUS SULFATE 325 (65 FE) MG PO TABS: 325.0000 mg | ORAL_TABLET | Freq: Every day | ORAL | 3 refills | Status: DC

## 2018-02-24 MED ORDER — MELATONIN 3 MG PO CAPS: 1.0000 | ORAL_CAPSULE | Freq: Every day | ORAL | 1 refills | Status: DC

## 2018-02-24 NOTE — BH Specialist Note (Signed)
Integrated Behavioral Health Initial Visit  MRN: [id number] Name: [name] Hale  Number of Integrated Behavioral Health Clinician visits:: 2/6 Session Start time: 11:50  Session End time: 12:17 Total time: 30 minutes  Type of Service: Integrated Behavioral Health- Individual/Family Interpretor:Yes.   Interpretor Name and Language: Spanish   Warm Hand Off Completed.       SUBJECTIVE: [name] Hale is a 37 y.o. female accompanied by n/a Patient was referred by Luna KitchensKathryn Kooistra, CNM for depression Patient reports the following symptoms/concerns: Pt states her primary concern today is feeling sad about the loss of her previous pregnancy at 18 weeks, dreams about seeing and hearing her lost son call out to her; worries she did not do enough for him.  Pt has difficulty falling asleep at night (11pm-6am, waking up 3 times during this time), fatigue throughout the day, and has slowed down on breastfeeding in past 3 weeks. Pt did not continue taking prenatal vitamins and/or iron after son was born.  Duration of problem: Increase in depression in past month; Severity of problem: moderate  OBJECTIVE: Mood: Anxious and Depressed and Affect: Tearful Risk of harm to self or others: No plan to harm self or others  LIFE CONTEXT: Family and Social: Pt lives with her husband and children (6yo daughter; 5 mo son) School/Work: Husband works Self-Care: - Life Changes: 5 months postpartum; reduced breastfeeding about 3 weeks ago  GOALS ADDRESSED: Patient will: 1. Reduce symptoms of: anxiety and depression 2. Increase knowledge and/or ability of: healthy habits  3. Demonstrate ability to: Increase healthy adjustment to current life circumstances  INTERVENTIONS: Interventions utilized: Brief CBT and Sleep Hygiene  Standardized Assessments completed: Not given today  ASSESSMENT: Patient currently experiencing Adjustment disorder with mixed anxious and depressed mood.   Patient  may benefit from continued brief therapeutic interventions regarding coping with symptoms of anxiety and depression.  PLAN: 1. Follow up with behavioral health clinician on : Two days via phone; two weeks in office 2. Behavioral recommendations:  -Begin taking medications, as recommended by medical provider (Zoloft, melatonin, and iron).  -Continue using sleep sounds for children's improved sleep, for as long as remains helpful -Limit coffee to one cup in the morning only -[facility] 3. Referral(s): Integrated Art gallery managerBehavioral Health Services (In Clinic) and [facility] (LME/Outside Clinic) 4. "From scale of 1-10, how likely are you to follow plan?": -  Rae LipsJamie C , LCSW  Depression screen [facility] 2/[facility] 09/24/2017 09/18/2017 09/05/2017 08/22/2017 08/07/2017  Decreased Interest 0 0 0 0 0  Down, Depressed, Hopeless 0 0 0 0 0  PHQ - 2 Score 0 0 0 0 0  Altered sleeping 0 0 0 0 0  Tired, decreased energy 0 0 0 0 0  Change in appetite 0 0 0 0 0  Feeling bad or failure about yourself  0 0 0 0 0  Trouble concentrating 0 0 0 0 0  Moving slowly or fidgety/restless 0 0 0 0 0  Suicidal thoughts 0 0 0 0 0  PHQ-9 Score 0 0 0 0 0  Some recent data might be hidden   GAD 7 : Generalized Anxiety Score 09/24/2017 09/18/2017 09/05/2017 08/22/2017  Nervous, Anxious, on Edge 0 0 0 0  Control/stop worrying 0 0 0 0  Worry too much - different things 0 0 0 0  Trouble relaxing 0 0 0 0  Restless 0 0 0 0  Easily annoyed or irritable 0 0 0  0  Afraid - awful might happen 0 0 0 0  Total GAD 7 Score 0 0 0 0

## 2018-02-24 NOTE — Progress Notes (Signed)
Patient here for gyn problem visit; however, during her RN intake it became apparent that patient had many psycho-social stressors and no gyn complaint. She [facility].  Per McMannes suggestion, patient will be prescribed Melatonin, Zoloft and iron and return to see her in two weeks.   [name] KitchensKathryn Lidia Hale

## 2018-02-24 NOTE — Progress Notes (Signed)
Patient here for gyn problem visit; however, during her RN intake it became apparent that patient had many psycho-social stressors and no gyn complaint. She was moved to [name] Hale schedule.  Per Hale suggestion, patient will be prescribed Melatonin, Zoloft and iron and return to see her in two weeks.   Alric Geise  

## 2018-02-26 ENCOUNTER — Telehealth: Payer: Self-pay | Admitting: Clinical

## 2018-02-26 NOTE — Telephone Encounter (Signed)
Integrated Behavioral Health Medication Management Phone Note  MRN: [id number] NAME: [name] Hale   Left HIPPA-compliant message to call back [name] from [facility]  at [phone].     Valetta CloseJamie C Mathilde Mcwherter, LCSW

## 2018-03-10 ENCOUNTER — Ambulatory Visit (INDEPENDENT_AMBULATORY_CARE_PROVIDER_SITE_OTHER): Payer: Medicaid Other | Admitting: Clinical

## 2018-03-10 DIAGNOSIS — F4323 Adjustment disorder with mixed anxiety and depressed mood: Secondary | ICD-10-CM | POA: Diagnosis not present

## 2018-03-10 NOTE — BH Specialist Note (Signed)
Integrated Behavioral Health Follow Up Visit  MRN: [id number] Name: [name] Hale  Number of Integrated Behavioral Health Clinician visits: 3/6 Session Start time: 10:10  Session End time: 10:33 Total time: 20 minutes  Type of Service: Integrated Behavioral Health- Individual/Family Interpretor:Yes.   Interpretor Name and Language: n/a  SUBJECTIVE: [name] Hale is a 37 y.o. female accompanied by 2 children Patient was referred by Luna KitchensKathryn Kooistra, CNM for depression Patient reports the following symptoms/concerns: Pt states she has been doing very well since beginning to take Zoloft,  symptoms of depression and worry have gone down in the past week, is sleeping a little better, not as tired, and is taking multivitamins. [facility] as she lost number to call. Pt has no other concerns at this time.  Duration of problem: Postpartum; Severity of problem: mild  OBJECTIVE: Mood: Normal and Affect: Appropriate Risk of harm to self or others: No plan to harm self or others  LIFE CONTEXT: Family and Social: Pt lives with her husband and children (6yo daughter; 87mo son) School/Work: Husband is working Self-Care: - Life Changes: over 5 months postpartum, and gradually reducing breastfeeding in over one month  GOALS ADDRESSED: Patient will: 1.  Maintain reduction in symptoms of: anxiety and depression   INTERVENTIONS: Interventions utilized:  Medication Monitoring Standardized Assessments completed: GAD-7 and PHQ 9  ASSESSMENT: Patient currently experiencing Adjustment disorder with mixed anxious and depressed mood Patient may benefit from medication monitoring today, and refer to community behavioral health.  PLAN: 1. Follow up with behavioral health clinician on : As needed 2. Behavioral recommendations:  -Continue taking BH medication as prescribed by medical provider -[facility], and set up initial  assessment appointment, for ongoing [facility] 3. Referral(s): Integrated Art gallery managerBehavioral Health Services (In Clinic) and [facility] (LME/Outside Clinic) 4. "From scale of 1-10, how likely are you to follow plan?": 8  Rae LipsJamie C Akita Maxim, LCSW   Depression screen [facility] 2/9 09/24/2017 09/18/2017 09/05/2017 08/22/2017 08/07/2017  Decreased Interest 0 0 0 0 0  Down, Depressed, Hopeless 0 0 0 0 0  PHQ - 2 Score 0 0 0 0 0  Altered sleeping 0 0 0 0 0  Tired, decreased energy 0 0 0 0 0  Change in appetite 0 0 0 0 0  Feeling bad or failure about yourself  0 0 0 0 0  Trouble concentrating 0 0 0 0 0  Moving slowly or fidgety/restless 0 0 0 0 0  Suicidal thoughts 0 0 0 0 0  PHQ-9 Score 0 0 0 0 0  Some recent data might be hidden   GAD 7 : Generalized Anxiety Score 09/24/2017 09/18/2017 09/05/2017 08/22/2017  Nervous, Anxious, on Edge 0 0 0 0  Control/stop worrying 0 0 0 0  Worry too much - different things 0 0 0 0  Trouble relaxing 0 0 0 0  Restless 0 0 0 0  Easily annoyed or irritable 0 0 0 0  Afraid - awful might happen 0 0 0 0  Total GAD 7 Score 0 0 0 0

## 2018-03-17 ENCOUNTER — Other Ambulatory Visit: Payer: Self-pay | Admitting: Student

## 2018-03-17 ENCOUNTER — Telehealth: Payer: Self-pay | Admitting: Clinical

## 2018-03-17 MED ORDER — SERTRALINE HCL 50 MG PO TABS: 50.0000 mg | ORAL_TABLET | Freq: Every day | ORAL | 3 refills | Status: DC

## 2018-03-17 NOTE — Telephone Encounter (Signed)
Pt is made aware that her medical provider at [facility], Rollen Sox, CNM, has sent additional refills for her Zoloft, which will give her enough time to set up an appointment with one of the Spanish-speaking therapists at [facility], and to continue [facility] medication management with either [facility] or to establish with a PCP. Pt verbally agrees to this plan, and has no other questions or concerns.

## 2018-03-31 IMAGING — US US MFM FETAL NUCHAL TRANSLUCENCY
1 series · 14 of 28 positions shown · non-contrast
Comparison: none

[Series 1: us mfm fetal nuchal translucency · 14 of 56 slices shown]
[im 3/56]
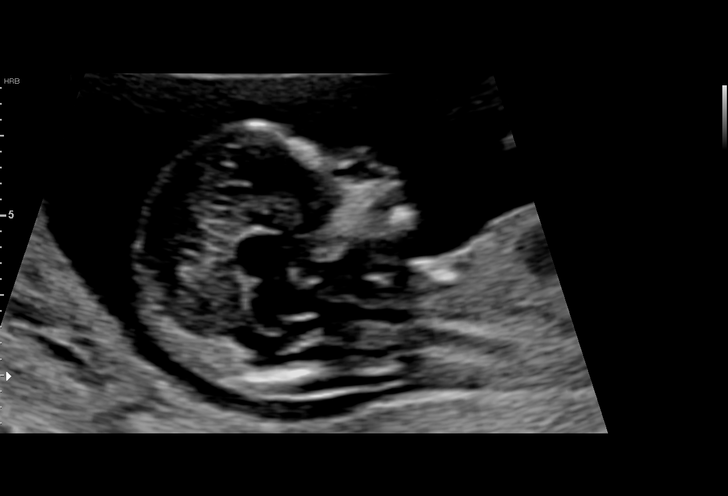
[im 7/56]
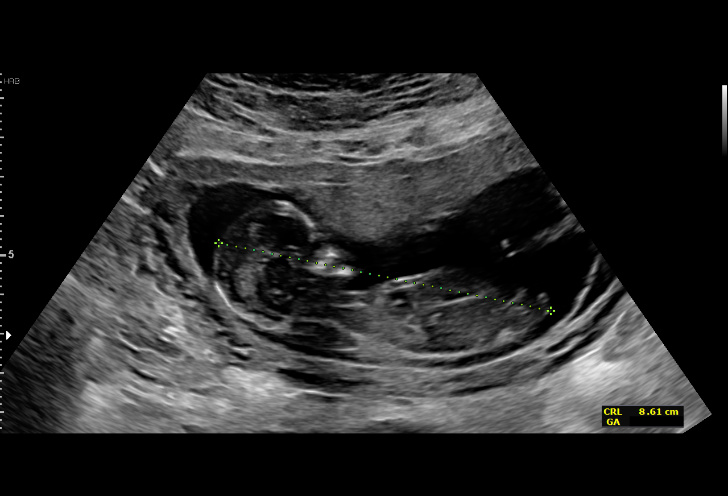
[im 11/56]
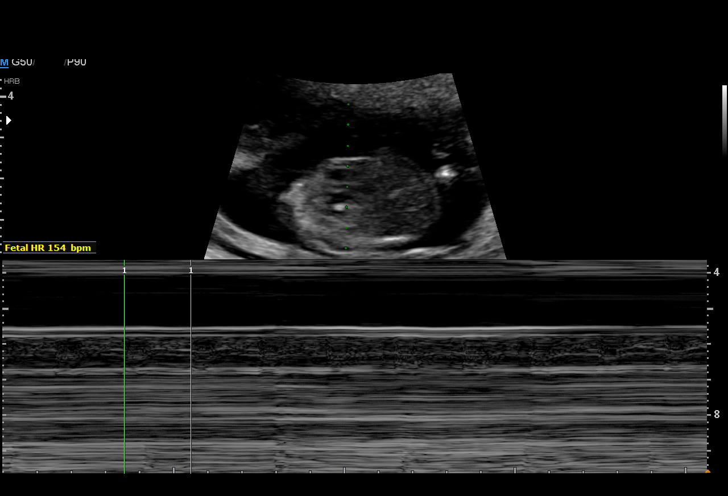
[im 15/56]
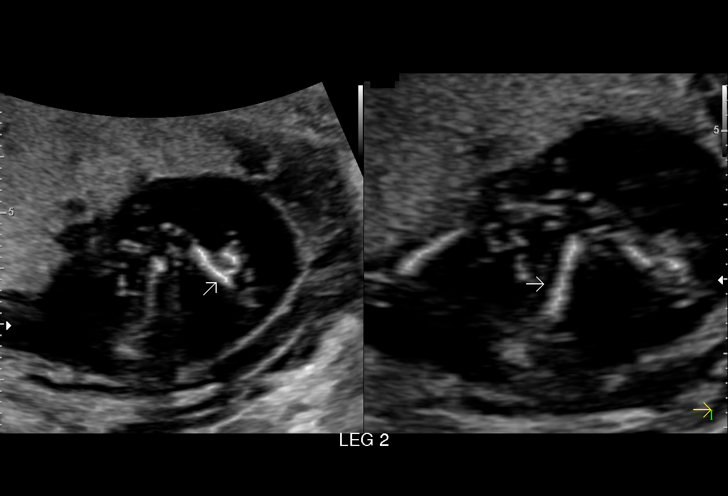
[im 19/56]
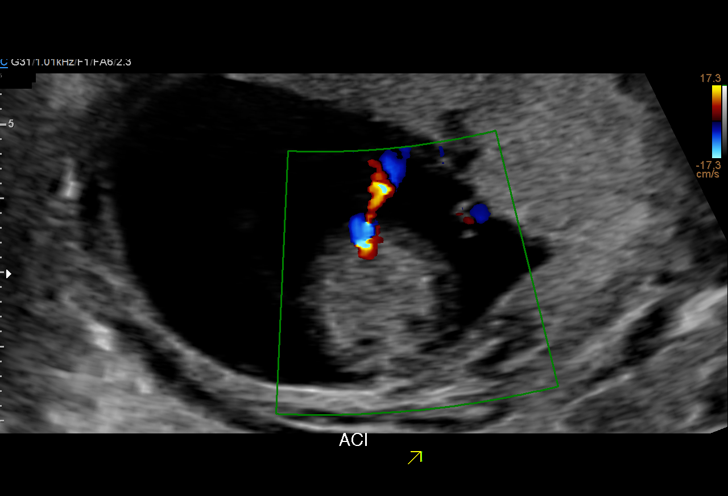
[im 23/56]
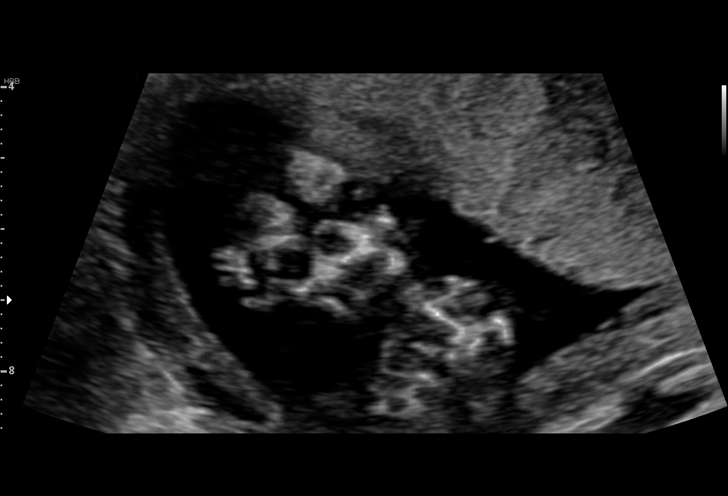
[im 27/56]
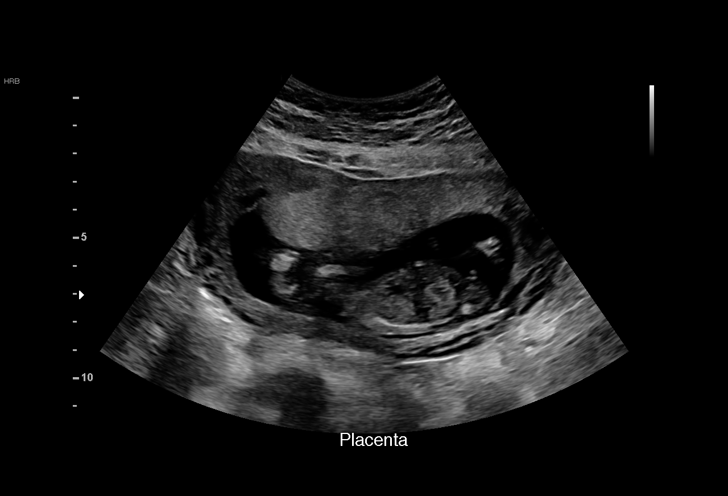
[im 31/56]
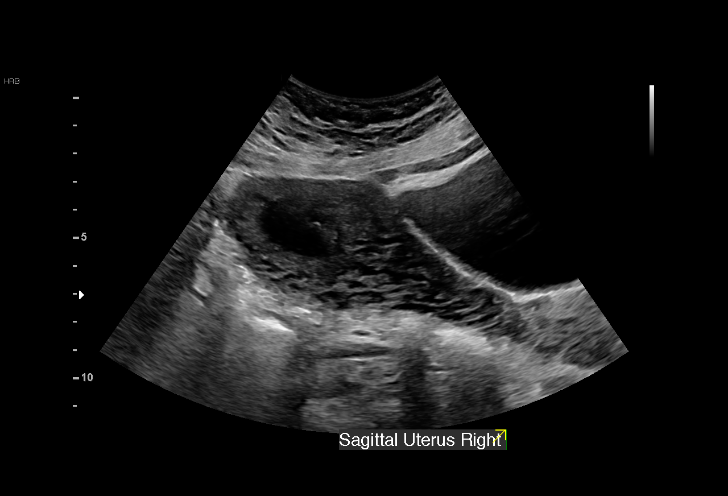
[im 35/56]
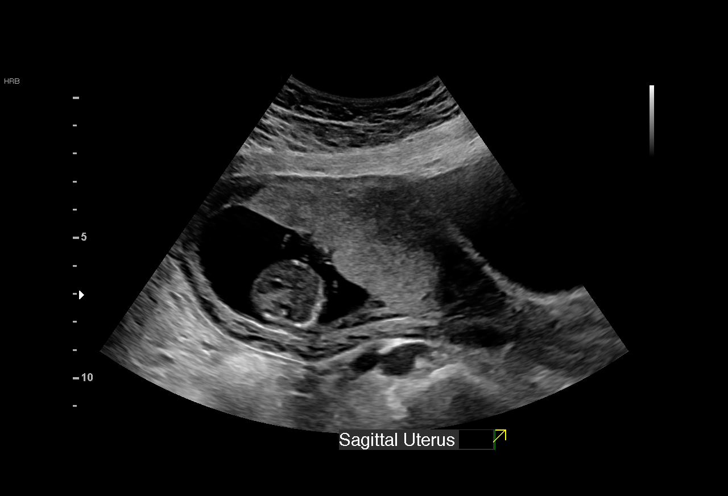
[im 39/56]
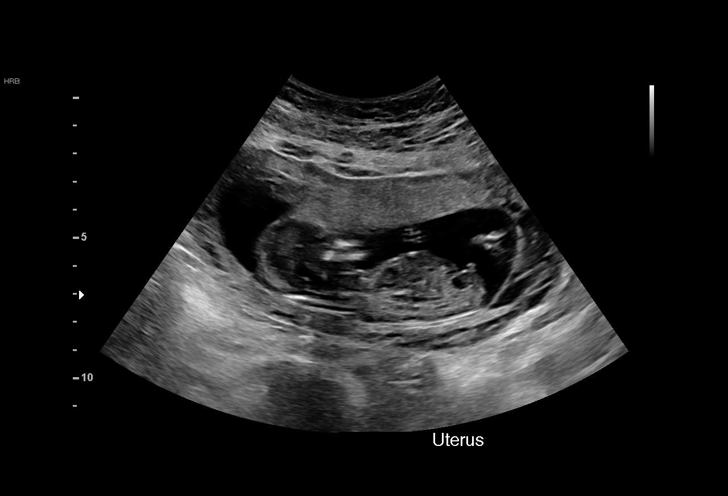
[im 43/56]
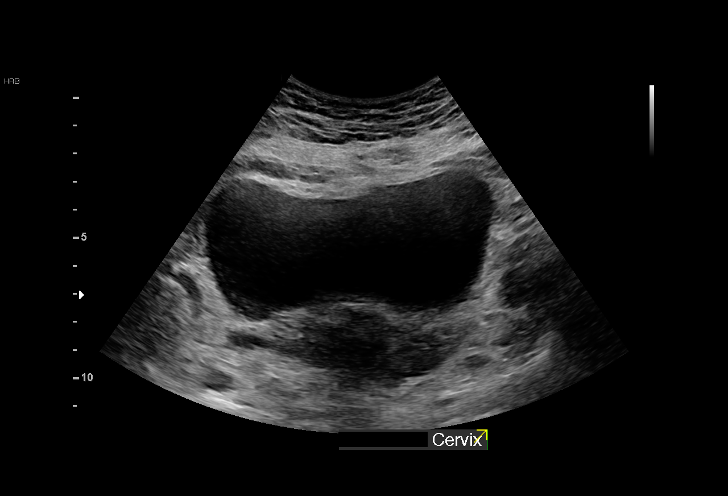
[im 47/56]
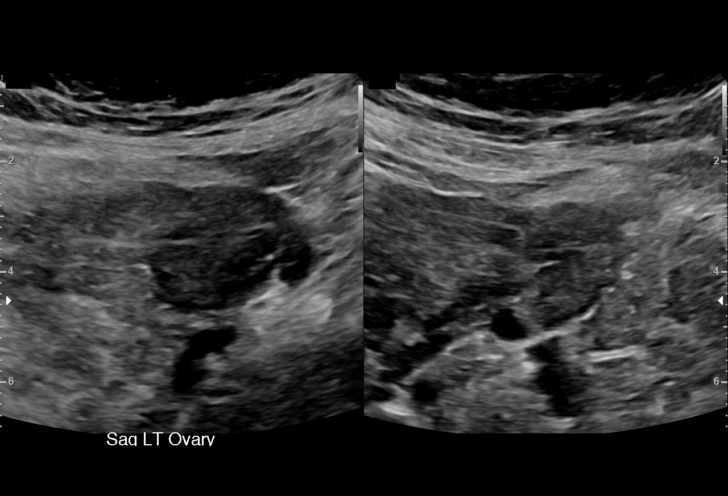
[im 51/56]
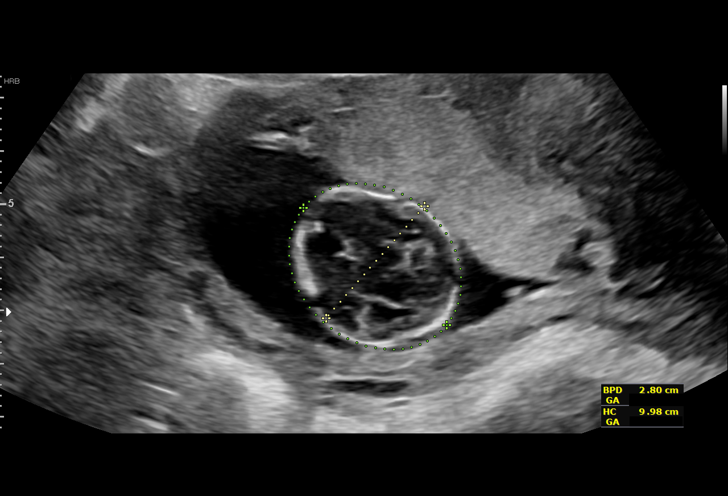
[im 56/56]
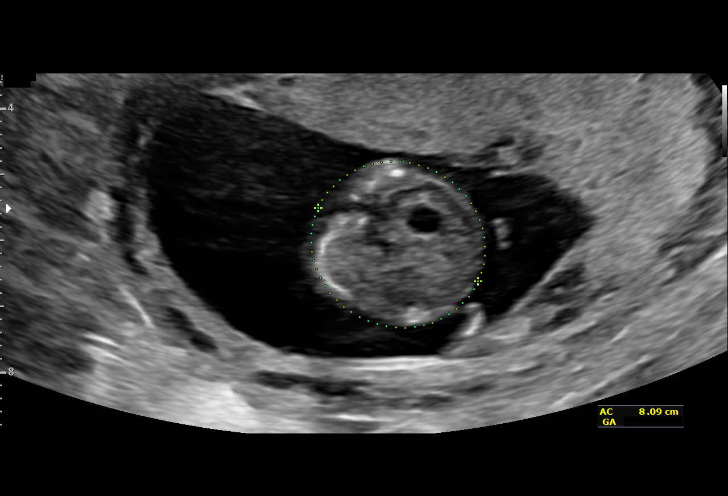

[14 of 28 positions shown; findings below may reference images not displayed]

MORALESCANIL

Health
DOREK NP

TRANSLUCENCY

1  KACEM                [id number]      [id number]     [id number]
DOREK
Indications

14 weeks gestation of pregnancy
Encounter for nuchal translucency
Advanced maternal age multigravida 37+,
first trimester
History of cesarean delivery, currently
pregnant
Poor obstetric history: Previous IUFD
(stillbirth) @ 80wks
OB History

Blood Type:            Height:  4'9"   Weight (lb):  138       BMI:
Gravidity:    3         Term:   1        Prem:   1
Living:       1
Fetal Evaluation

Num Of Fetuses:     1
Fetal Heart         154
Rate(bpm):
Cardiac Activity:   Observed
Placenta:           Anterior

Amniotic Fluid
AFI FV:      Subjectively within normal limits
Biometry

BPD:        28  mm     G. Age:  15w 0d                  CI:        80.31   %    70 - 86
FL/HC:      13.0   %
HC:       98.7  mm     G. Age:  14w 4d                  HC/AC:      1.24        1.14 -
AC:       79.8  mm     G. Age:  14w 3d                  FL/BPD:     45.7   %
FL:       12.8  mm     G. Age:  13w 5d                  FL/AC:      16.0   %    20 - 24

Est. FW:      91  gm      0 lb 3 oz
Gestational Age

LMP:           11w 0d        Date:  01/23/17                 EDD:   10/30/17
U/S Today:     14w 3d                                        EDD:   10/06/17
Best:          14w 3d     Det. By:  U/S (04/10/17)           EDD:   10/06/17
1st Trimester Genetic Sonogram Screening

CRL:            85.4  mm    G. Age:   14w 0d                 EDD:   10/09/17
Nuc Trans:       1.4  mm

Nasal Bone:                 Present
Anatomy

Cranium:               Appears normal         Abdomen:                Appears normal
Choroid Plexus:        Appears normal         Abdominal Wall:         Appears nml (cord
insert, abd wall)
Thoracic:              Appears normal         Bladder:                Appears normal
Stomach:               Appears normal, left
sided
Cervix Uterus Adnexa

Cervix
Normal appearance by transabdominal scan.

Uterus
No abnormality visualized.

Left Ovary
Size(cm)     2.45   x   1.33   x  1.36      Vol(ml):
Within normal limits.

Right Ovary
Size(cm)     3.78   x   3.01   x  2.44      Vol(ml):
Within normal limits.

Cul De Sac:   No free fluid seen.

Adnexa:       No abnormality visualized.
Impression

SIUP at 14+3 weeks
No gross abnormalities identified
NT measurement was within normal limits for this GA; NB
present
Normal amniotic fluid volume
Measurements consistent with

She received genetic counseling for AMA. All of her prenatal
testing and pregnancy management options were reviewed.
After careful consideration, she declined amniocentesis but
opted for cell free DNA screening.
Recommendations

Offer MSAFP in the second trimester for ONTD screening
Offer detailed U/S by 18 weeks - scheduled today

## 2018-04-24 ENCOUNTER — Other Ambulatory Visit: Payer: Self-pay | Admitting: Student

## 2018-05-04 ENCOUNTER — Other Ambulatory Visit: Payer: Self-pay | Admitting: Obstetrics and Gynecology

## 2018-05-06 ENCOUNTER — Encounter (HOSPITAL_COMMUNITY): Payer: Self-pay | Admitting: Emergency Medicine

## 2018-05-06 ENCOUNTER — Emergency Department (HOSPITAL_COMMUNITY)
Admission: EM | Admit: 2018-05-06 | Discharge: 2018-05-06 | Disposition: A | Discharge status: Home / Self Care | Payer: Medicaid Other | Attending: Emergency Medicine | Admitting: Emergency Medicine

## 2018-05-06 ENCOUNTER — Other Ambulatory Visit: Payer: Self-pay

## 2018-05-06 DIAGNOSIS — N898 Other specified noninflammatory disorders of vagina: Secondary | ICD-10-CM

## 2018-05-06 DIAGNOSIS — Z79899 Other long term (current) drug therapy: Secondary | ICD-10-CM | POA: Diagnosis not present

## 2018-05-06 DIAGNOSIS — N760 Acute vaginitis: Secondary | ICD-10-CM | POA: Diagnosis not present

## 2018-05-06 DIAGNOSIS — R102 Pelvic and perineal pain: Secondary | ICD-10-CM | POA: Diagnosis present

## 2018-05-06 DIAGNOSIS — R1030 Lower abdominal pain, unspecified: Secondary | ICD-10-CM

## 2018-05-06 DIAGNOSIS — B9689 Other specified bacterial agents as the cause of diseases classified elsewhere: Secondary | ICD-10-CM

## 2018-05-06 LAB — URINALYSIS, ROUTINE W REFLEX MICROSCOPIC
Bilirubin Urine: NEGATIVE
GLUCOSE, UA: NEGATIVE mg/dL
Hgb urine dipstick: NEGATIVE
Ketones, ur: NEGATIVE mg/dL
Leukocytes,Ua: NEGATIVE
Nitrite: NEGATIVE
PH: 6 (ref 5.0–8.0)
Protein, ur: NEGATIVE mg/dL
Specific Gravity, Urine: <1.005 — ABNORMAL LOW (ref 1.005–1.030)

## 2018-05-06 LAB — WET PREP, GENITAL
Sperm: NONE SEEN
Trich, Wet Prep: NONE SEEN
Yeast Wet Prep HPF POC: NONE SEEN

## 2018-05-06 LAB — PREGNANCY, URINE: Preg Test, Ur: NEGATIVE

## 2018-05-06 MED ORDER — ACETAMINOPHEN 325 MG PO TABS
650.0000 mg | ORAL_TABLET | Freq: Once | ORAL | Status: DC
  Filled 2018-05-06: qty 2

## 2018-05-06 MED ORDER — CLINDAMYCIN HCL 150 MG PO CAPS: 300.0000 mg | ORAL_CAPSULE | Freq: Two times a day (BID) | ORAL | 0 refills | Status: AC
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
## 2018-05-06 NOTE — ED Notes (Signed)
PA and RN using translator to prep for and complete pelvic exam.

## 2018-05-06 NOTE — Discharge Instructions (Signed)
Please take all of your medication as prescribed Please finish the course of antibiotics Follow up with your OBGYN after you have finished antibiotics  Return to the ED if any worsening symptoms

## 2018-05-06 NOTE — ED Triage Notes (Signed)
Lower abd pain that rads to back since last Friday hurts when she uses the bathroom both pee and poop denies n/v/d, no fever

## 2018-05-06 NOTE — ED Notes (Signed)
Got patient undress on the monitor patient is resting with call bell in reach and family at bedside 

## 2018-05-07 LAB — GC/CHLAMYDIA PROBE AMP (~~LOC~~) NOT AT ARMC
Chlamydia: NEGATIVE
Neisseria Gonorrhea: NEGATIVE

## 2018-07-22 ENCOUNTER — Other Ambulatory Visit: Payer: Self-pay | Admitting: Obstetrics and Gynecology

## 2018-09-30 ENCOUNTER — Other Ambulatory Visit: Payer: Self-pay | Admitting: Student

## 2018-12-19 ENCOUNTER — Inpatient Hospital Stay (HOSPITAL_COMMUNITY)
Admission: EM | Admit: 2018-12-19 | Discharge: 2018-12-20 | DRG: 419 | Disposition: A | Discharge status: Home / Self Care | Payer: Medicaid Other | Attending: General Surgery | Admitting: General Surgery

## 2018-12-19 ENCOUNTER — Encounter (HOSPITAL_COMMUNITY): Admission: EM | Disposition: A | Discharge status: Home / Self Care | Payer: Self-pay | Source: Home / Self Care

## 2018-12-19 ENCOUNTER — Emergency Department (HOSPITAL_COMMUNITY): Payer: Medicaid Other | Admitting: Anesthesiology

## 2018-12-19 ENCOUNTER — Other Ambulatory Visit: Payer: Self-pay

## 2018-12-19 ENCOUNTER — Emergency Department (HOSPITAL_COMMUNITY): Payer: Medicaid Other

## 2018-12-19 ENCOUNTER — Encounter (HOSPITAL_COMMUNITY): Payer: Self-pay | Admitting: *Deleted

## 2018-12-19 DIAGNOSIS — Z87898 Personal history of other specified conditions: Secondary | ICD-10-CM

## 2018-12-19 DIAGNOSIS — Z20828 Contact with and (suspected) exposure to other viral communicable diseases: Secondary | ICD-10-CM | POA: Diagnosis present

## 2018-12-19 DIAGNOSIS — F4310 Post-traumatic stress disorder, unspecified: Secondary | ICD-10-CM | POA: Diagnosis present

## 2018-12-19 DIAGNOSIS — Z789 Other specified health status: Secondary | ICD-10-CM

## 2018-12-19 DIAGNOSIS — Z915 Personal history of self-harm: Secondary | ICD-10-CM

## 2018-12-19 DIAGNOSIS — K8012 Calculus of gallbladder with acute and chronic cholecystitis without obstruction: Principal | ICD-10-CM | POA: Diagnosis present

## 2018-12-19 DIAGNOSIS — K8000 Calculus of gallbladder with acute cholecystitis without obstruction: Secondary | ICD-10-CM

## 2018-12-19 DIAGNOSIS — R1013 Epigastric pain: Secondary | ICD-10-CM | POA: Diagnosis present

## 2018-12-19 DIAGNOSIS — K828 Other specified diseases of gallbladder: Secondary | ICD-10-CM | POA: Diagnosis present

## 2018-12-19 DIAGNOSIS — Z8659 Personal history of other mental and behavioral disorders: Secondary | ICD-10-CM

## 2018-12-19 DIAGNOSIS — K8020 Calculus of gallbladder without cholecystitis without obstruction: Secondary | ICD-10-CM

## 2018-12-19 HISTORY — PX: LAPAROSCOPIC CHOLECYSTECTOMY SINGLE SITE WITH INTRAOPERATIVE CHOLANGIOGRAM: SHX6538

## 2018-12-19 LAB — I-STAT BETA HCG BLOOD, ED (MC, WL, AP ONLY): I-stat hCG, quantitative: <5 m[IU]/mL (ref <5)

## 2018-12-19 LAB — CBC
HCT: 37.4 % (ref 36.0–46.0)
Hemoglobin: 12.4 g/dL (ref 12.0–15.0)
MCH: 29.8 pg (ref 26.0–34.0)
MCHC: 33.2 g/dL (ref 30.0–36.0)
MCV: 89.9 fL (ref 80.0–100.0)
Platelets: 299 10*3/uL (ref 150–400)
RBC: 4.16 MIL/uL (ref 3.87–5.11)
RDW: 13 % (ref 11.5–15.5)
WBC: 13.4 10*3/uL — ABNORMAL HIGH (ref 4.0–10.5)
nRBC: 0 % (ref 0.0–0.2)

## 2018-12-19 LAB — COMPREHENSIVE METABOLIC PANEL
ALT: 16 U/L (ref 0–44)
AST: 15 U/L (ref 15–41)
Albumin: 4.7 g/dL (ref 3.5–5.0)
Alkaline Phosphatase: 60 U/L (ref 38–126)
Anion gap: 13 (ref 5–15)
BUN: 9 mg/dL (ref 6–20)
CO2: 21 mmol/L — ABNORMAL LOW (ref 22–32)
Calcium: 9.4 mg/dL (ref 8.9–10.3)
Chloride: 103 mmol/L (ref 98–111)
Creatinine, Ser: 0.61 mg/dL (ref 0.44–1.00)
GFR calc Af Amer: >60 mL/min (ref >60)
GFR calc non Af Amer: >60 mL/min (ref >60)
Glucose, Bld: 108 mg/dL — ABNORMAL HIGH (ref 70–99)
Potassium: 3.3 mmol/L — ABNORMAL LOW (ref 3.5–5.1)
Sodium: 137 mmol/L (ref 135–145)
Total Bilirubin: 0.4 mg/dL (ref 0.3–1.2)
Total Protein: 7.9 g/dL (ref 6.5–8.1)

## 2018-12-19 LAB — URINALYSIS, ROUTINE W REFLEX MICROSCOPIC
Bilirubin Urine: NEGATIVE
Glucose, UA: NEGATIVE mg/dL
Hgb urine dipstick: NEGATIVE
Ketones, ur: 20 mg/dL — AB
Leukocytes,Ua: NEGATIVE
Nitrite: NEGATIVE
Protein, ur: NEGATIVE mg/dL
Specific Gravity, Urine: 1.008 (ref 1.005–1.030)
pH: 8 (ref 5.0–8.0)

## 2018-12-19 LAB — LIPASE, BLOOD: Lipase: 37 U/L (ref 11–51)

## 2018-12-19 LAB — SARS CORONAVIRUS 2 BY RT PCR (HOSPITAL ORDER, PERFORMED IN ~~LOC~~ HOSPITAL LAB): SARS Coronavirus 2: NEGATIVE

## 2018-12-19 SURGERY — LAPAROSCOPIC CHOLECYSTECTOMY SINGLE SITE WITH INTRAOPERATIVE CHOLANGIOGRAM
Anesthesia: General | Site: Abdomen

## 2018-12-19 MED ORDER — PROCHLORPERAZINE EDISYLATE 10 MG/2ML IJ SOLN: 5.0000 mg | Freq: Four times a day (QID) | INTRAMUSCULAR | Status: DC | PRN

## 2018-12-19 MED ORDER — MAGIC MOUTHWASH
15.0000 mL | Freq: Four times a day (QID) | ORAL | Status: DC | PRN
  Filled 2018-12-19: qty 15

## 2018-12-19 MED ORDER — HYDROMORPHONE HCL 1 MG/ML IJ SOLN
0.5000 mg | Freq: Once | INTRAMUSCULAR | Status: AC
  Administered 2018-12-19: 0.5 mg via INTRAVENOUS
  Filled 2018-12-19: qty 1

## 2018-12-19 MED ORDER — IOHEXOL 300 MG/ML  SOLN
100.0000 mL | Freq: Once | INTRAMUSCULAR | Status: AC | PRN
  Administered 2018-12-19: 100 mL via INTRAVENOUS

## 2018-12-19 MED ORDER — SUGAMMADEX SODIUM 200 MG/2ML IV SOLN
INTRAVENOUS | Status: DC | PRN
  Administered 2018-12-19: 200 mg via INTRAVENOUS

## 2018-12-19 MED ORDER — BUPIVACAINE LIPOSOME 1.3 % IJ SUSP
INTRAMUSCULAR | Status: DC | PRN
  Administered 2018-12-19: 20 mL

## 2018-12-19 MED ORDER — BUPIVACAINE HCL (PF) 0.25 % IJ SOLN
INTRAMUSCULAR | Status: AC
  Filled 2018-12-19: qty 30

## 2018-12-19 MED ORDER — ACETAMINOPHEN 500 MG PO TABS
1000.0000 mg | ORAL_TABLET | Freq: Three times a day (TID) | ORAL | Status: DC
  Administered 2018-12-19 – 2018-12-20 (×2): 1000 mg via ORAL
  Filled 2018-12-19 (×3): qty 2

## 2018-12-19 MED ORDER — OXYCODONE HCL 5 MG PO TABS
5.0000 mg | ORAL_TABLET | ORAL | Status: DC | PRN
  Administered 2018-12-19 – 2018-12-20 (×2): 10 mg via ORAL
  Filled 2018-12-19 (×2): qty 2

## 2018-12-19 MED ORDER — BUPIVACAINE LIPOSOME 1.3 % IJ SUSP
20.0000 mL | Freq: Once | INTRAMUSCULAR | Status: DC
  Filled 2018-12-19: qty 20

## 2018-12-19 MED ORDER — GABAPENTIN 300 MG PO CAPS
300.0000 mg | ORAL_CAPSULE | ORAL | Status: AC
  Administered 2018-12-19: 300 mg via ORAL

## 2018-12-19 MED ORDER — PIPERACILLIN-TAZOBACTAM 3.375 G IVPB 30 MIN: 3.3750 g | Freq: Once | INTRAVENOUS | Status: DC

## 2018-12-19 MED ORDER — PROPOFOL 10 MG/ML IV BOLUS
INTRAVENOUS | Status: AC
  Filled 2018-12-19: qty 20

## 2018-12-19 MED ORDER — CHLORHEXIDINE GLUCONATE CLOTH 2 % EX PADS: 6.0000 | MEDICATED_PAD | Freq: Once | CUTANEOUS | Status: DC

## 2018-12-19 MED ORDER — CELECOXIB 200 MG PO CAPS
200.0000 mg | ORAL_CAPSULE | ORAL | Status: AC
  Administered 2018-12-19: 200 mg via ORAL
  Filled 2018-12-19: qty 1

## 2018-12-19 MED ORDER — FENTANYL CITRATE (PF) 250 MCG/5ML IJ SOLN
INTRAMUSCULAR | Status: AC
  Filled 2018-12-19: qty 5

## 2018-12-19 MED ORDER — MIDAZOLAM HCL 2 MG/2ML IJ SOLN
INTRAMUSCULAR | Status: AC
  Filled 2018-12-19: qty 2

## 2018-12-19 MED ORDER — GABAPENTIN 300 MG PO CAPS
ORAL_CAPSULE | ORAL | Status: AC
  Filled 2018-12-19: qty 1

## 2018-12-19 MED ORDER — GABAPENTIN 300 MG PO CAPS
300.0000 mg | ORAL_CAPSULE | Freq: Two times a day (BID) | ORAL | Status: DC
  Administered 2018-12-19 – 2018-12-20 (×2): 300 mg via ORAL
  Filled 2018-12-19 (×3): qty 1

## 2018-12-19 MED ORDER — METHOCARBAMOL 500 MG PO TABS: 750.0000 mg | ORAL_TABLET | Freq: Four times a day (QID) | ORAL | Status: DC | PRN

## 2018-12-19 MED ORDER — SERTRALINE HCL 50 MG PO TABS: 50.0000 mg | ORAL_TABLET | Freq: Every day | ORAL | Status: DC

## 2018-12-19 MED ORDER — ACETAMINOPHEN 500 MG PO TABS
1000.0000 mg | ORAL_TABLET | ORAL | Status: AC
  Administered 2018-12-19: 1000 mg via ORAL

## 2018-12-19 MED ORDER — STERILE WATER FOR IRRIGATION IR SOLN
Status: DC | PRN
  Administered 2018-12-19: 1000 mL

## 2018-12-19 MED ORDER — OXYCODONE HCL 5 MG/5ML PO SOLN: 5.0000 mg | Freq: Once | ORAL | Status: DC | PRN

## 2018-12-19 MED ORDER — FERROUS SULFATE 325 (65 FE) MG PO TABS
325.0000 mg | ORAL_TABLET | Freq: Every day | ORAL | Status: DC
  Administered 2018-12-20: 325 mg via ORAL
  Filled 2018-12-19: qty 1

## 2018-12-19 MED ORDER — ONDANSETRON 4 MG PO TBDP
4.0000 mg | ORAL_TABLET | Freq: Once | ORAL | Status: AC
  Administered 2018-12-19: 4 mg via ORAL
  Filled 2018-12-19: qty 1

## 2018-12-19 MED ORDER — SCOPOLAMINE 1 MG/3DAYS TD PT72
1.0000 | MEDICATED_PATCH | TRANSDERMAL | Status: DC
  Administered 2018-12-19: 1 via TRANSDERMAL
  Filled 2018-12-19 (×2): qty 1

## 2018-12-19 MED ORDER — ONDANSETRON HCL 4 MG/2ML IJ SOLN
4.0000 mg | Freq: Once | INTRAMUSCULAR | Status: AC
  Administered 2018-12-19: 08:00:00 4 mg via INTRAVENOUS
  Filled 2018-12-19: qty 2

## 2018-12-19 MED ORDER — LACTATED RINGERS IV SOLN
INTRAVENOUS | Status: DC | PRN
  Administered 2018-12-19: 13:00:00 via INTRAVENOUS

## 2018-12-19 MED ORDER — MIDAZOLAM HCL 5 MG/5ML IJ SOLN
INTRAMUSCULAR | Status: DC | PRN
  Administered 2018-12-19: 2 mg via INTRAVENOUS

## 2018-12-19 MED ORDER — BUPIVACAINE HCL (PF) 0.25 % IJ SOLN
INTRAMUSCULAR | Status: DC | PRN
  Administered 2018-12-19: 30 mL

## 2018-12-19 MED ORDER — MELATONIN 3 MG PO CAPS: 1.0000 | ORAL_CAPSULE | Freq: Every day | ORAL | Status: DC

## 2018-12-19 MED ORDER — ONDANSETRON HCL 4 MG/2ML IJ SOLN
INTRAMUSCULAR | Status: DC | PRN
  Administered 2018-12-19: 4 mg via INTRAVENOUS

## 2018-12-19 MED ORDER — ONDANSETRON HCL 4 MG/2ML IJ SOLN
INTRAMUSCULAR | Status: AC
  Filled 2018-12-19: qty 2

## 2018-12-19 MED ORDER — SIMETHICONE 80 MG PO CHEW: 40.0000 mg | CHEWABLE_TABLET | Freq: Four times a day (QID) | ORAL | Status: DC | PRN

## 2018-12-19 MED ORDER — ROCURONIUM BROMIDE 10 MG/ML (PF) SYRINGE
PREFILLED_SYRINGE | INTRAVENOUS | Status: AC
  Filled 2018-12-19: qty 10

## 2018-12-19 MED ORDER — SODIUM CHLORIDE 0.9% FLUSH
3.0000 mL | Freq: Once | INTRAVENOUS | Status: AC
  Administered 2018-12-19: 3 mL via INTRAVENOUS

## 2018-12-19 MED ORDER — METOCLOPRAMIDE HCL 5 MG/ML IJ SOLN
INTRAMUSCULAR | Status: AC
  Filled 2018-12-19: qty 2

## 2018-12-19 MED ORDER — HYDROMORPHONE HCL 1 MG/ML IJ SOLN: 0.2500 mg | INTRAMUSCULAR | Status: DC | PRN

## 2018-12-19 MED ORDER — SODIUM CHLORIDE 0.9 % IV SOLN
INTRAVENOUS | Status: DC
  Administered 2018-12-19: 16:00:00 via INTRAVENOUS

## 2018-12-19 MED ORDER — LIDOCAINE 2% (20 MG/ML) 5 ML SYRINGE
INTRAMUSCULAR | Status: AC
  Filled 2018-12-19: qty 5

## 2018-12-19 MED ORDER — GENTAMICIN IN SALINE 1-0.9 MG/ML-% IV SOLN: 100.0000 mg | INTRAVENOUS | Status: DC

## 2018-12-19 MED ORDER — PROMETHAZINE HCL 25 MG/ML IJ SOLN: 6.2500 mg | INTRAMUSCULAR | Status: DC | PRN

## 2018-12-19 MED ORDER — ENALAPRILAT 1.25 MG/ML IV SOLN
0.6250 mg | Freq: Four times a day (QID) | INTRAVENOUS | Status: DC | PRN
  Filled 2018-12-19: qty 1

## 2018-12-19 MED ORDER — LIDOCAINE 2% (20 MG/ML) 5 ML SYRINGE
INTRAMUSCULAR | Status: DC | PRN
  Administered 2018-12-19: 60 mg via INTRAVENOUS

## 2018-12-19 MED ORDER — DEXAMETHASONE SODIUM PHOSPHATE 10 MG/ML IJ SOLN
INTRAMUSCULAR | Status: AC
  Filled 2018-12-19: qty 1

## 2018-12-19 MED ORDER — SODIUM CHLORIDE 0.9 % IV SOLN
INTRAVENOUS | Status: DC
  Administered 2018-12-19: 08:00:00 via INTRAVENOUS

## 2018-12-19 MED ORDER — BISACODYL 10 MG RE SUPP: 10.0000 mg | Freq: Every day | RECTAL | Status: DC | PRN

## 2018-12-19 MED ORDER — HYDROMORPHONE HCL 1 MG/ML IJ SOLN: 0.5000 mg | INTRAMUSCULAR | Status: DC | PRN

## 2018-12-19 MED ORDER — SODIUM CHLORIDE 0.9 % IV SOLN
2.0000 g | INTRAVENOUS | Status: DC
  Administered 2018-12-19: 2 g via INTRAVENOUS
  Filled 2018-12-19: qty 2

## 2018-12-19 MED ORDER — MEPERIDINE HCL 50 MG/ML IJ SOLN: 6.2500 mg | INTRAMUSCULAR | Status: DC | PRN

## 2018-12-19 MED ORDER — ACETAMINOPHEN 500 MG PO TABS: 1000.0000 mg | ORAL_TABLET | Freq: Once | ORAL | Status: DC

## 2018-12-19 MED ORDER — SUCCINYLCHOLINE CHLORIDE 200 MG/10ML IV SOSY
PREFILLED_SYRINGE | INTRAVENOUS | Status: AC
  Filled 2018-12-19: qty 10

## 2018-12-19 MED ORDER — DEXAMETHASONE SODIUM PHOSPHATE 10 MG/ML IJ SOLN
INTRAMUSCULAR | Status: DC | PRN
  Administered 2018-12-19: 10 mg via INTRAVENOUS

## 2018-12-19 MED ORDER — LIP MEDEX EX OINT
1.0000 "application " | TOPICAL_OINTMENT | Freq: Two times a day (BID) | CUTANEOUS | Status: DC
  Administered 2018-12-19: 1 via TOPICAL
  Filled 2018-12-19: qty 7

## 2018-12-19 MED ORDER — ONDANSETRON 4 MG PO TBDP: 4.0000 mg | ORAL_TABLET | Freq: Four times a day (QID) | ORAL | Status: DC | PRN

## 2018-12-19 MED ORDER — LACTATED RINGERS IV SOLN
INTRAVENOUS | Status: AC | PRN
  Administered 2018-12-19: 1000 mL

## 2018-12-19 MED ORDER — PROPOFOL 10 MG/ML IV BOLUS
INTRAVENOUS | Status: DC | PRN
  Administered 2018-12-19: 200 mg via INTRAVENOUS

## 2018-12-19 MED ORDER — DIPHENHYDRAMINE HCL 12.5 MG/5ML PO ELIX: 12.5000 mg | ORAL_SOLUTION | Freq: Four times a day (QID) | ORAL | Status: DC | PRN

## 2018-12-19 MED ORDER — LACTATED RINGERS IV BOLUS
1000.0000 mL | Freq: Three times a day (TID) | INTRAVENOUS | Status: DC | PRN
  Administered 2018-12-20: 1000 mL via INTRAVENOUS

## 2018-12-19 MED ORDER — PHENYLEPHRINE 40 MCG/ML (10ML) SYRINGE FOR IV PUSH (FOR BLOOD PRESSURE SUPPORT)
PREFILLED_SYRINGE | INTRAVENOUS | Status: DC | PRN
  Administered 2018-12-19 (×2): 80 ug via INTRAVENOUS

## 2018-12-19 MED ORDER — METOCLOPRAMIDE HCL 5 MG/ML IJ SOLN
INTRAMUSCULAR | Status: DC | PRN
  Administered 2018-12-19: 10 mg via INTRAVENOUS

## 2018-12-19 MED ORDER — CELECOXIB 200 MG PO CAPS
ORAL_CAPSULE | ORAL | Status: AC
  Filled 2018-12-19: qty 1

## 2018-12-19 MED ORDER — ROCURONIUM BROMIDE 10 MG/ML (PF) SYRINGE
PREFILLED_SYRINGE | INTRAVENOUS | Status: DC | PRN
  Administered 2018-12-19: 50 mg via INTRAVENOUS

## 2018-12-19 MED ORDER — LACTATED RINGERS IV SOLN: 1000.0000 mL | Freq: Three times a day (TID) | INTRAVENOUS | Status: DC | PRN

## 2018-12-19 MED ORDER — DIPHENHYDRAMINE HCL 50 MG/ML IJ SOLN: 12.5000 mg | Freq: Four times a day (QID) | INTRAMUSCULAR | Status: DC | PRN

## 2018-12-19 MED ORDER — OXYCODONE HCL 5 MG PO TABS: 5.0000 mg | ORAL_TABLET | Freq: Once | ORAL | Status: DC | PRN

## 2018-12-19 MED ORDER — METOPROLOL TARTRATE 5 MG/5ML IV SOLN: 5.0000 mg | Freq: Four times a day (QID) | INTRAVENOUS | Status: DC | PRN

## 2018-12-19 MED ORDER — ZOLPIDEM TARTRATE 5 MG PO TABS: 5.0000 mg | ORAL_TABLET | Freq: Every evening | ORAL | Status: DC | PRN

## 2018-12-19 MED ORDER — FENTANYL CITRATE (PF) 250 MCG/5ML IJ SOLN
INTRAMUSCULAR | Status: DC | PRN
  Administered 2018-12-19 (×2): 100 ug via INTRAVENOUS

## 2018-12-19 MED ORDER — POLYETHYLENE GLYCOL 3350 17 G PO PACK: 17.0000 g | PACK | Freq: Every day | ORAL | Status: DC | PRN

## 2018-12-19 MED ORDER — SODIUM CHLORIDE (PF) 0.9 % IJ SOLN
INTRAMUSCULAR | Status: AC
  Filled 2018-12-19: qty 50

## 2018-12-19 MED ORDER — GENTAMICIN SULFATE 40 MG/ML IJ SOLN
5.0000 mg/kg | INTRAVENOUS | Status: AC
  Administered 2018-12-19: 260 mg via INTRAVENOUS
  Filled 2018-12-19: qty 6.5

## 2018-12-19 MED ORDER — CLINDAMYCIN PHOSPHATE 900 MG/50ML IV SOLN
900.0000 mg | INTRAVENOUS | Status: AC
  Administered 2018-12-19: 900 mg via INTRAVENOUS
  Filled 2018-12-19: qty 50

## 2018-12-19 MED ORDER — ACETAMINOPHEN 500 MG PO TABS
ORAL_TABLET | ORAL | Status: AC
  Filled 2018-12-19: qty 2

## 2018-12-19 MED ORDER — ONDANSETRON HCL 4 MG/2ML IJ SOLN: 4.0000 mg | Freq: Four times a day (QID) | INTRAMUSCULAR | Status: DC | PRN

## 2018-12-19 MED ORDER — 0.9 % SODIUM CHLORIDE (POUR BTL) OPTIME
TOPICAL | Status: DC | PRN
  Administered 2018-12-19: 14:00:00 1000 mL

## 2018-12-19 MED ORDER — ENOXAPARIN SODIUM 40 MG/0.4ML ~~LOC~~ SOLN: 40.0000 mg | SUBCUTANEOUS | Status: DC

## 2018-12-19 MED ORDER — PROCHLORPERAZINE MALEATE 10 MG PO TABS: 10.0000 mg | ORAL_TABLET | Freq: Four times a day (QID) | ORAL | Status: DC | PRN

## 2018-12-19 MED ORDER — IOHEXOL 300 MG/ML  SOLN
INTRAMUSCULAR | Status: DC | PRN
  Administered 2018-12-19: 5 mL

## 2018-12-19 MED ORDER — KETOROLAC TROMETHAMINE 30 MG/ML IJ SOLN: 30.0000 mg | Freq: Once | INTRAMUSCULAR | Status: DC | PRN

## 2018-12-19 SURGICAL SUPPLY — 41 items
APPLIER CLIP 5 13 M/L LIGAMAX5 (MISCELLANEOUS) ×3
CABLE HIGH FREQUENCY MONO STRZ (ELECTRODE) ×3 IMPLANT
CHLORAPREP W/TINT 26 (MISCELLANEOUS) ×3 IMPLANT
CLIP APPLIE 5 13 M/L LIGAMAX5 (MISCELLANEOUS) ×1 IMPLANT
COVER MAYO STAND STRL (DRAPES) ×3 IMPLANT
COVER SURGICAL LIGHT HANDLE (MISCELLANEOUS) ×3 IMPLANT
COVER WAND RF STERILE (DRAPES) IMPLANT
DECANTER SPIKE VIAL GLASS SM (MISCELLANEOUS) ×3 IMPLANT
DRAIN CHANNEL 19F RND (DRAIN) IMPLANT
DRAPE C-ARM 42X120 X-RAY (DRAPES) ×3 IMPLANT
DRAPE WARM FLUID 44X44 (DRAPES) ×3 IMPLANT
DRSG TEGADERM 4X4.75 (GAUZE/BANDAGES/DRESSINGS) ×3 IMPLANT
ELECT REM PT RETURN 15FT ADLT (MISCELLANEOUS) ×3 IMPLANT
ENDOLOOP SUT PDS II  0 18 (SUTURE)
ENDOLOOP SUT PDS II 0 18 (SUTURE) IMPLANT
EVACUATOR SILICONE 100CC (DRAIN) IMPLANT
GAUZE SPONGE 2X2 8PLY STRL LF (GAUZE/BANDAGES/DRESSINGS) ×1 IMPLANT
GLOVE ECLIPSE 8.0 STRL XLNG CF (GLOVE) ×3 IMPLANT
GLOVE INDICATOR 8.0 STRL GRN (GLOVE) ×3 IMPLANT
GOWN STRL REUS W/TWL XL LVL3 (GOWN DISPOSABLE) ×6 IMPLANT
IRRIG SUCT STRYKERFLOW 2 WTIP (MISCELLANEOUS) ×3
IRRIGATION SUCT STRKRFLW 2 WTP (MISCELLANEOUS) ×1 IMPLANT
KIT BASIN OR (CUSTOM PROCEDURE TRAY) ×3 IMPLANT
KIT TURNOVER KIT A (KITS) IMPLANT
PAD POSITIONING PINK XL (MISCELLANEOUS) ×3 IMPLANT
POUCH RETRIEVAL ECOSAC 10 (ENDOMECHANICALS) IMPLANT
POUCH RETRIEVAL ECOSAC 10MM (ENDOMECHANICALS)
PROTECTOR NERVE ULNAR (MISCELLANEOUS) ×3 IMPLANT
SCISSORS LAP 5X35 DISP (ENDOMECHANICALS) ×3 IMPLANT
SET CHOLANGIOGRAPH MIX (MISCELLANEOUS) ×3 IMPLANT
SET TUBE SMOKE EVAC HIGH FLOW (TUBING) ×3 IMPLANT
SHEARS HARMONIC ACE PLUS 36CM (ENDOMECHANICALS) ×3 IMPLANT
SPONGE GAUZE 2X2 STER 10/PKG (GAUZE/BANDAGES/DRESSINGS) ×2
SUT MNCRL AB 4-0 PS2 18 (SUTURE) ×3 IMPLANT
SUT PDS AB 1 CT1 27 (SUTURE) ×6 IMPLANT
SYR 20ML LL LF (SYRINGE) ×3 IMPLANT
TOWEL OR 17X26 10 PK STRL BLUE (TOWEL DISPOSABLE) ×3 IMPLANT
TOWEL OR NON WOVEN STRL DISP B (DISPOSABLE) ×3 IMPLANT
TRAY LAPAROSCOPIC (CUSTOM PROCEDURE TRAY) ×3 IMPLANT
TROCAR BLADELESS OPT 5 100 (ENDOMECHANICALS) ×3 IMPLANT
TROCAR BLADELESS OPT 5 150 (ENDOMECHANICALS) ×3 IMPLANT

## 2018-12-19 NOTE — Anesthesia Postprocedure Evaluation (Signed)
Anesthesia Post Note  Patient: Publishing rights manager  Procedure(s) Performed: LAPAROSCOPIC CHOLECYSTECTOMY SINGLE SITE WITH INTRAOPERATIVE CHOLANGIOGRAM (N/A Abdomen)     Patient location during evaluation: PACU Anesthesia Type: General Level of consciousness: awake and alert, oriented and patient cooperative Pain management: pain level controlled Vital Signs Assessment: post-procedure vital signs reviewed and stable Respiratory status: spontaneous breathing, nonlabored ventilation and respiratory function stable Cardiovascular status: blood pressure returned to baseline and stable Postop Assessment: no apparent nausea or vomiting Anesthetic complications: no    Last Vitals:  Vitals:   12/19/18 1130 12/19/18 1450  BP:  111/71  Pulse: 81 72  Resp:  18  Temp:  (!) 36.4 C  SpO2: 100% 100%    Last Pain:  Vitals:   12/19/18 1515  TempSrc:   PainSc: [place]

## 2018-12-19 NOTE — ED Notes (Signed)
Attempted IV access twice, unsuccessful.
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
## 2018-12-19 NOTE — Op Note (Signed)
12/19/2018  PATIENT:  [name] Hale  38 y.o. female  Patient Care Team: Patient, No Pcp Per as PCP - General (General Practice)  PRE-OPERATIVE DIAGNOSIS:    Acute on Chronic Calculus Cholecystitis  POST-OPERATIVE DIAGNOSIS:   Acute on Chronic Calculus Cholecystitis  Liver: normal  PROCEDURE:  SINGLE SITE Laparoscopic cholecystectomy with intraoperative cholangiogram  SURGEON:  Adin Hector, MD, FACS.  ASSISTANT: OR Staff   ANESTHESIA:    General with endotracheal intubation Local anesthetic as a field block  EBL:  (See Anesthesia Intraoperative Record) Total I/O In: 600 [I.V.:600] Out: 81 [Blood:50]  Delay start of Pharmacological VTE agent (>24hrs) due to surgical blood loss or risk of bleeding:  no  DRAINS: None   SPECIMEN: Gallbladder    DISPOSITION OF SPECIMEN:  PATHOLOGY  COUNTS:  YES  PLAN OF CARE: Admit for overnight observation  PATIENT DISPOSITION:  PACU - hemodynamically stable.  INDICATION: Woman with episodes of episodic epigastric pain radiating to her back with nausea and vomiting.  Worsening symptoms.  Intractable pain.  CAT scan showing gallbladder inflammation with Coley pericholecystic fluid and gallstones.  Rest of the differential diagnosis underwhelming.  I recommended laparoscopic exploration and cholecystectomy  The anatomy & physiology of hepatobiliary & pancreatic function was discussed.  The pathophysiology of gallbladder dysfunction was discussed.  Natural history risks without surgery was discussed.   I feel the risks of no intervention will lead to serious problems that outweigh the operative risks; therefore, I recommended cholecystectomy to remove the pathology.  I explained laparoscopic techniques with possible need for an open approach.  Probable cholangiogram to evaluate the bilary tract was explained as well.    Risks such as bleeding, infection, abscess, leak, injury to other organs, need for further treatment, heart  attack, death, and other risks were discussed.  I noted a good likelihood this will help address the problem.  Possibility that this will not correct all abdominal symptoms was explained.  Goals of post-operative recovery were discussed as well.  We will work to minimize complications.  An educational handout further explaining the pathology and treatment options was given as well.  Questions were answered.  The patient expresses understanding & wishes to proceed with surgery.  OR FINDINGS: Inflamed edematous gallbladder edematous turgid with large impacted gallstones in the infundibulum.  Some chronic thickening and wall changes.  Consistent with acute on chronic cholecystitis.  Cholangiogram showing no biliary obstruction or choledocholithiasis.  Perhaps some mild sludge in the distal common bile duct but easily flushes into the duodenum without difficulty.  No evidence of any gastritis or duodenitis.  No strong evidence of any other intra-abdominal abnormalities.  Liver: normal  DESCRIPTION:   The patient was identified & brought in the operating room. The patient was positioned supine with arms tucked. SCDs were active during the entire case. The patient underwent general anesthesia without any difficulty.  The abdomen was prepped and draped in a sterile fashion. A Surgical Timeout confirmed our plan.  I made a transverse curvilinear incision through the superior umbilical fold.  I placed a 13mm long port through the supraumbilical fascia using a modified Hassan cutdown technique with umbilical stalk fascial countertraction. I began carbon dioxide insufflation.  No change in end tidal CO2 measurement.   Camera inspection revealed no injury. There were no adhesions to the anterior abdominal wall supraumbilically.  I proceeded to continue with single site technique. I placed a #5 port in left upper aspect of the wound. I placed a 5 mm  atraumatic grasper in the right inferior aspect of the wound.  I  turned attention to the right upper quadrant.  Gallbladder had some chronic thickening and changes.  Obviously and edematous and inflamed consistent with acute on chronic cholecystitis.  Abdominal expiration show no other areas of concern.  No evidence of any peritonitis gastritis or perforation.  No duodenitis.  The gallbladder fundus was elevated cephalad. I freed adhesions to the ventral surface of the gallbladder off carefully.  I freed the peritoneal coverings between the gallbladder and the liver on the posteriolateral and anteriomedial walls. I alternated between Harmonic & blunt Maryland dissection to help get a good critical view of the cystic artery and cystic duct.  did further dissection to free 80%of the gallbladder off the liver bed to get a good critical view of the infundibulum and cystic duct. I dissected out the cystic artery; and, after getting a good 360 view, ligated the anterior & posterior branches of the cystic artery close on the infundibulum using the Harmonic ultrasonic dissection.  I skeletonized the cystic duct.  I placed a clip on the infundibulum. I did a partial cystic duct-otomy and ensured patency. I placed a 5 [place] cholangiocatheter through a puncture site at the right subcostal ridge of the abdominal wall and directed it into the cystic duct.  We ran a cholangiogram with dilute radio-opaque contrast and continuous fluoroscopy. Contrast flowed from a side branch consistent with cystic duct cannulization. Contrast flowed up the common hepatic duct into the right and left intrahepatic chains out to secondary radicals. Contrast flowed down the common bile duct easily across the normal ampulla into the duodenum.  This was consistent with a normal cholangiogram.  I removed the cholangiocatheter. I placed clips on the cystic duct x4.  I completed cystic duct transection. I freed the gallbladder from its remaining attachments to the liver. I ensured hemostasis on the gallbladder  fossa of the liver and elsewhere. I inspected the rest of the abdomen & detected no injury nor bleeding elsewhere.  I placed the gallbladder inside and eco-sac bag and did irrigation.  Hemostasis good.  Clips intact.  I removed the gallbladder out the supraumbilical fascia.  I had open up the fascia to 3 cm to get the enlarged gallbladder with its large stones out.  I closed the fascia transversely using #1 PDS running suture. I closed the skin using 4-0 monocryl stitch.  Sterile dressing was applied. The patient was extubated & arrived in the PACU in stable condition..  I made an attempt to locate family to discuss patient's status and recommendations.  No one is available at this time.  I will try again later   Ardeth Sportsman, M.D., F.A.C.S. Gastrointestinal and Minimally Invasive Surgery [facility] [address] [place], [place] [address] [phone] Main / Paging  12/19/2018 2:41 PM

## 2018-12-19 NOTE — ED Notes (Addendum)
Pt. Wiped herself down to go to the OR. Nurse aware.

## 2018-12-19 NOTE — Anesthesia Procedure Notes (Signed)
Procedure Name: Intubation Date/Time: 12/19/2018 1:25 PM Performed by: Talbot Grumbling, CRNA Pre-anesthesia Checklist: Patient identified, Emergency Drugs available, Suction available and Patient being monitored Patient Re-evaluated:Patient Re-evaluated prior to induction Oxygen Delivery Method: Circle system utilized Preoxygenation: Pre-oxygenation with 100% oxygen Induction Type: IV induction Ventilation: Mask ventilation without difficulty Laryngoscope Size: 3 and Mac Grade View: Grade I Tube type: Oral Tube size: 7.5 mm Number of attempts: 1 Airway Equipment and Method: Stylet Placement Confirmation: ETT inserted through vocal cords under direct vision,  positive ETCO2 and breath sounds checked- equal and bilateral Secured at: 21 cm Tube secured with: Tape Dental Injury: Teeth and Oropharynx as per pre-operative assessment
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
## 2018-12-19 NOTE — Transfer of Care (Signed)
Immediate Anesthesia Transfer of Care Note  Patient: [facility]  Procedure(s) Performed: LAPAROSCOPIC CHOLECYSTECTOMY SINGLE SITE WITH INTRAOPERATIVE CHOLANGIOGRAM (N/A Abdomen)  Patient Location: PACU  Anesthesia Type:General  Level of Consciousness: sedated  Airway & Oxygen Therapy: Patient Spontanous Breathing and Patient connected to face mask oxygen  Post-op Assessment: Report given to RN and Post -op Vital signs reviewed and stable  Post vital signs: Reviewed and stable  Last Vitals:  Vitals Value Taken Time  BP    Temp    Pulse    Resp    SpO2      Last Pain:  Vitals:   12/19/18 1123  TempSrc: Oral  PainSc:          Complications: No apparent anesthesia complications

## 2018-12-19 NOTE — Plan of Care (Signed)
Patient arrived from PACU via stretcher; slid to bed w/ no assist. Pain controlled on admit; no needs expressed. Will continue to monitor.

## 2018-12-19 NOTE — Progress Notes (Signed)
I discussed operative findings, updated the patient's status, discussed probable steps to recovery, and gave postoperative recommendations to the patient's spouse.  Recommendations were made.  Questions were answered.  He expressed understanding & appreciation.  Adin Hector, MD, FACS, MASCRS Gastrointestinal and Minimally Invasive Surgery    [address], [place], [place] [address] [phone] Main / Paging [phone] Fax

## 2018-12-19 NOTE — ED Triage Notes (Signed)
After triage, the pt says that she has actually been having this pain since February, but it is just worse now.

## 2018-12-19 NOTE — ED Triage Notes (Signed)
Pt arrives via [facility] from home with upper abdominal pain and upper back pain. 132/70, 76, 98%, 97.8. Limited english speaking.

## 2018-12-19 NOTE — ED Notes (Signed)
Patient going to CT at this time
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
## 2018-12-19 NOTE — ED Triage Notes (Signed)
Via video [place] interpreter, the pt is reporting she is having pain in the upper abdomen and radiates to the back for three nights. Pain does not occur as severe during the day, it is worse at night. Burning pain. Nausea. Vomiting improves the pain.

## 2018-12-19 NOTE — ED Notes (Signed)
Consent form fill out and patient will sign it OR.

## 2018-12-20 ENCOUNTER — Encounter (HOSPITAL_COMMUNITY): Payer: Self-pay | Admitting: Surgery

## 2018-12-20 MED ORDER — OXYCODONE HCL 5 MG PO TABS: 5.0000 mg | ORAL_TABLET | ORAL | 0 refills | Status: DC | PRN

## 2018-12-20 NOTE — Discharge Instructions (Signed)
CIRUGA LAPAROSCPICA: INSTRUCCIONES POSTOPERATORIAS  ################################################ ####################  COMER Haga la transicin gradualmente a una dieta alta en fibra con un suplemento de fibra durante las prximas semanas despus del alta. Comience con una dieta lquida completa / en pur (ver ms abajo)  CAMINAR Camine una hora al da. Controle su dolor para hacer eso.  CONTROL DEL DOLOR Controle el dolor para poder caminar, dormir, tolerar los estornudos / toser, subir / Architectural technologist.  TENGA UN MOVIMIENTO INTESTINAL DIARIAMENTE Mantenga sus intestinos regulares para evitar problemas. Est bien probar un laxante para eliminar el estreimiento. Est bien usar un antidirreico para Social research officer, government. Llame si no mejora despus de 2 intentos  LLAME SI TIENE PROBLEMAS / INQUIETUDES Llame si todava tiene dificultades a pesar de seguir estas instrucciones. Llame si tiene inquietudes que estas instrucciones no responden  ################################################ ####################    DIETA: Siga una dieta ligera y blanda y lquidos las primeras 24 horas despus de su llegada a casa, como sopa, lquidos, almidones, etc. Asegrese de beber muchos lquidos. Avance rpidamente a una dieta slida habitual en unos [employer]. Evite la comida rpida o las comidas pesadas ya que es ms probable que sienta nuseas o tenga intestinos irregulares. Lo ideal es una dieta baja en grasas y alta en fibra por el resto de su vida.  Tome sus medicamentos caseros recetados habitualmente a menos que se le indique lo contrario.  CONTROL DE DOLOR: El dolor se controla mejor mediante una combinacin habitual de tres mtodos diferentes JUNTOS: Hielo / Calor Analgsicos de venta libre Analgsicos recetados La mayora de los pacientes experimentarn algo de hinchazn y hematomas alrededor de las incisiones. Las bolsas de hielo o las almohadillas trmicas (de 30 a 60 minutos  hasta 6 veces al da) ayudarn. Use hielo durante los primeros das para ayudar a disminuir la hinchazn y los hematomas, luego cambie a calor para ayudar a International aid/development worker los puntos tensos / doloridos y Hydrologist. Algunas Youth worker solo, solo calor, alternando entre hielo y Airline pilot. Experimente con lo que funcione para usted. La hinchazn y los hematomas pueden tardar varias semanas en resolverse. Es til Airline pilot de venta libre con regularidad durante las primeras semanas. Elija uno de los siguientes que funcione mejor para usted: Naproxeno (Aleve, etc.) Dos tabletas de 220 mg [employer] al da Ibuprofeno (Advil, etc.) Tres tabletas de 200 mg cuatro veces al da (cada comida y antes de Teacher, music) Acetaminofn (Tylenol, etc.) 500-650 mg cuatro veces al da (cada comida y antes de Teacher, music) Se le debe dar una receta para analgsicos (como oxicodona, hidrocodona, tramadol, gabapentina, metocarbamol, etc.) al momento del alta. Tome sus analgsicos segn lo prescrito. Si tiene problemas / inquietudes con el medicamento recetado (no controla el dolor, las nuseas, los vmitos, el sarpullido, la picazn, etc.), llmenos al [phone] para ver si necesitamos cambiarlo a otro medicamento para Chief Technology Officer. que funcionar mejor para usted y / o Chief Operating Officer mejor sus efectos secundarios. Si necesita un resurtido de su medicamento para Chief Technology Officer, avsenos con 48 horas de anticipacin. comunquese con su farmacia. Se comunicarn con nuestra oficina para solicitar autorizacin. Las recetas no se surtirn despus de las 5 pm o los fines de Public relations account executive. Entre la [place] y los analgsicos, es comn experimentar algo de estreimiento. Aumentar la ingesta de lquidos y tomar un suplemento de fibra (como Metamucil, Citrucel, FiberCon, [place], etc.) de 1 a 2 veces al da con regularidad generalmente ayudar a evitar que  ocurra este problema. Se debe tomar un laxante  suave (jugo de Loop, [place] de magnesia, [place], etc.) de acuerdo con las instrucciones del paquete si no hay evacuaciones intestinales despus de 48 horas. Cuidado con la diarrea. Si tiene muchas deposiciones blandas, simplifique su dieta a alimentos y lquidos [address] Detenga los ablandadores de heces y [place] su suplemento de Meno. Cambiar a medicamentos antidiarreicos leves (Kayopectate, Pepto Bismol) puede ayudar. Si esto empeora o no mejora, llmanos.  Lvese / dchese [employer]. Puede ducharse sobre los apsitos ya que son impermeables. Contine duchndose Intel (s) incisin (es) despus de quitarse el apsito.  Qutese las vendas impermeables 5 das despus de la [place]. Puede dejar la incisin abierta al aire. Puede reemplazar un apsito / curita para cubrir la incisin para mayor comodidad si lo desea.  ACTIVIDADES segn lo tolerado: Puede reanudar las actividades diarias regulares (ligeras) a Glass blower/designer del da siguiente, como el cuidado personal diario, Advertising account planner, subir escaleras, aumentando gradualmente las actividades segn las Akins. Si puede caminar 30 minutos sin dificultad, es seguro intentar actividades ms intensas como trotar, andar en cinta, andar en bicicleta, aerbicos de bajo impacto, nadar, etc. Guarde la actividad ms intensa y extenuante para el final, como abdominales, levantar objetos pesados, deportes de contacto, etc. Abstenerse de levantar objetos pesados ??o esforzarse hasta que deje de tomar narcticos para Human resources officer.NO EMPUJE EL DOLOR. Deje que el dolor sea su gua: si le duele hacer algo, no lo haga. El dolor es su cuerpo que le advierte que evite esa actividad durante otra semana hasta que el dolor disminuya. Puede conducir cuando ya no est tomando analgsicos recetados, pueda usar un cinturn de seguridad cmodamente y Therapist, art con seguridad su automvil y Contractor los frenos. Puede tener relaciones sexuales cuando sea  cmodo.  SEGUIMIENTO en nuestra oficina Llame a [facility] al [phone] para programar una cita para ver a su cirujano en el consultorio para una cita de seguimiento aproximadamente 2-3 semanas despus de su ciruga. Asegrese de llamar para esta cita el da que llegue a casa para asegurar una hora conveniente para la cita.  10. SI USTED TIENE DISCAPACIDAD O FAMILIA DEJA LOS FORMULARIOS, LLEVELOS A LA OFICINA PARA SU PROCESAMIENTO. NO SE LOS D A SU MDICO.   CUNDO LLAMARNOS [phone]: Mal control del dolor Reacciones / problemas con nuevos medicamentos (sarpullido / picazn, nuseas, etc.) Fiebre superior a 101,5 F (38,5 C) Incapacidad para orinar Nuseas y / o vmitos Empeoramiento de la hinchazn o hematomas Sangrado continuo de la incisin. Aumento del Engineer, mining, enrojecimiento o supuracin de la incisin  El personal de la clnica est disponible para responder sus preguntas durante el horario comercial habitual (de 8:30 a. M. A 5 p. M.). No dude en llamar y pedir hablar con una de nuestras enfermeras si tiene inquietudes clnicas. Si tiene [employer], vaya a la sala de emergencias ms cercana o llame al 911. [facility] siempre est disponible en los hospitales.   [place] de [place], [place] [address], [place], [place] [address]? PRINCIPAL: [phone]? LLAMADA GRATUITA: [phone]? FAX [phone] [email or web address]     LAPAROSCOPIC SURGERY: POST OP INSTRUCTIONS  ######################################################################  EAT Gradually transition to a high fiber diet with a fiber supplement over the next few weeks after discharge.  Start with a pureed / full liquid diet (see below)  WALK Walk an hour a day.  Control your pain to do that.  CONTROL PAIN Control pain so that you can walk, sleep, tolerate sneezing/coughing, go up/down stairs.  HAVE A BOWEL MOVEMENT  DAILY Keep your bowels regular to avoid problems.  OK to try a laxative to override constipation.  OK to use an antidairrheal to slow down diarrhea.  Call if not better after 2 tries  CALL IF YOU HAVE PROBLEMS/CONCERNS Call if you are still struggling despite following these instructions. Call if you have concerns not answered by these instructions  ######################################################################    1. DIET: Follow a light bland diet & liquids the first 24 hours after arrival home, such as soup, liquids, starches, etc.  Be sure to drink plenty of fluids.  Quickly advance to a usual solid diet within a few days.  Avoid fast food or heavy meals as your are more likely to get nauseated or have irregular bowels.  A low-fat, high-fiber diet for the rest of your life is ideal.  2. Take your usually prescribed home medications unless otherwise directed.  3. PAIN CONTROL: a. Pain is best controlled by a usual combination of three different methods TOGETHER: i. Ice/Heat ii. Over the counter pain medication iii. Prescription pain medication b. Most patients will experience some swelling and bruising around the incisions.  Ice packs or heating pads (30-60 minutes up to 6 times a day) will help. Use ice for the first few days to help decrease swelling and bruising, then switch to heat to help relax tight/sore spots and speed recovery.  Some people prefer to use ice alone, heat alone, alternating between ice & heat.  Experiment to what works for you.  Swelling and bruising can take several weeks to resolve.   c. It is helpful to take an over-the-counter pain medication regularly for the first few weeks.  Choose one of the following that works best for you: i. Naproxen (Aleve, etc)  Two 220mg  tabs twice a day ii. Ibuprofen (Advil, etc) Three 200mg  tabs four times a day (every meal & bedtime) iii. Acetaminophen (Tylenol, etc) 500-650mg  four times a day (every meal & bedtime) d. A   prescription for pain medication (such as oxycodone, hydrocodone, tramadol, gabapentin, methocarbamol, etc) should be given to you upon discharge.  Take your pain medication as prescribed.  i. If you are having problems/concerns with the prescription medicine (does not control pain, nausea, vomiting, rash, itching, etc), please call [phone] to see if we need to switch you to a different pain medicine that will work better for you and/or control your side effect better. ii. If you need a refill on your pain medication, please give [place] 48 hour notice.  contact your pharmacy.  They will contact our office to request authorization. Prescriptions will not be filled after 5 pm or on week-ends  4. Avoid getting constipated.   a. Between the surgery and the pain medications, it is common to experience some constipation.   b. Increasing fluid intake and taking a fiber supplement (such as Metamucil, Citrucel, FiberCon, MiraLax, etc) 1-2 times a day regularly will usually help prevent this problem from occurring.   c. A mild laxative (prune juice, Milk of Magnesia, MiraLax, etc) should be taken according to package directions if there are no bowel movements after 48 hours.   5. Watch out for diarrhea.   a. If you have many loose bowel movements, simplify your diet to bland foods & liquids for a few days.   b. Stop any stool softeners and decrease your fiber supplement.  c. Switching to mild anti-diarrheal medications (Kayopectate, Pepto Bismol) can help.   d. If this worsens or does not improve, please call us.  6. Wash / shower every day.  You may shower over the dressings as they are waterproof.  Continue to shower over incision(s) after the dressing is off.  7. Remove your waterproof bandages 5 days after surgery.  You may leave the incision open to air.  You may replace a dressing/Band-Aid to cover the incision for comfort if you wish.   8. ACTIVITIES as tolerated:   a. You may resume regular  (light) daily activities beginning the next day--such as daily self-care, walking, climbing stairs--gradually increasing activities as tolerated.  If you can walk 30 minutes without difficulty, it is safe to try more intense activity such as jogging, treadmill, bicycling, low-impact aerobics, swimming, etc. b. Save the most intensive and strenuous activity for last such as sit-ups, heavy lifting, contact sports, etc  Refrain from any heavy lifting or straining until you are off narcotics for pain control.   c. DO NOT PUSH THROUGH PAIN.  Let pain be your guide: If it hurts to do something, don't do it.  Pain is your body warning you to avoid that activity for another week until the pain goes down. d. You may drive when you are no longer taking prescription pain medication, you can comfortably wear a seatbelt, and you can safely maneuver your car and apply brakes. e. Bonita QuinYou may have sexual intercourse when it is comfortable.  9. FOLLOW UP in our office a. Please call [facility] at [phone] to set up an appointment to see your surgeon in the office for a follow-up appointment approximately 2-3 weeks after your surgery. b. Make sure that you call for this appointment the day you arrive home to insure a convenient appointment time.  10. IF YOU HAVE DISABILITY OR FAMILY LEAVE FORMS, BRING THEM TO THE OFFICE FOR PROCESSING.  DO NOT GIVE THEM TO YOUR DOCTOR.   WHEN TO CALL US [phone]: 1. Poor pain control 2. Reactions / problems with new medications (rash/itching, nausea, etc)  3. Fever over 101.5 F (38.5 C) 4. Inability to urinate 5. Nausea and/or vomiting 6. Worsening swelling or bruising 7. Continued bleeding from incision. 8. Increased pain, redness, or drainage from the incision   The clinic staff is available to answer your questions during regular business hours (8:30am-5pm).  Please dont hesitate to call and ask to speak to one of our nurses for clinical concerns.   If you have a medical  emergency, go to the nearest emergency room or call 911.  A surgeon from [facility] is always on call at the [facility]   [facility], [place] [address], [address], [place], [place]  [id number] ? MAIN: [phone] ? TOLL FREE: [phone] ?  FAX [phone] [email or web address]   Colecistitis Cholecystitis  La colecistitis es la inflamacin de la vescula biliar. A menudo se la conoce como ataque de la vescula biliar. La vescula biliar es un rgano que tiene forma de pera y se encuentra debajo del hgado, del lado derecho del cuerpo. La vescula biliar almacena bilis, un lquido que ayuda al organismo a digerir las grasas. Si la bilis se acumula en la vescula biliar, esta se inflama. Esta afeccin se puede presentar de manera repentina. La colecistitis es una afeccin grave y requiere TEFL teachertratamiento. Cules son las causas? La causa ms frecuente de esta afeccin son los clculos biliares. Los  clculos biliares pueden obstruir los conductos (vas biliares) que transportan la bilis desde la vescula biliar. Esto causa la acumulacin de bilis. Algunas otras causas son las siguientes:  Dao a la vescula biliar debido a una disminucin del flujo sanguneo.  Infecciones de los conductos biliares.  Cicatrices y obstrucciones en los conductos biliares.  Tumores en el hgado, el pncreas o la vescula biliar. Qu incrementa el riesgo? Es ms probable que contraiga esta afeccin si:  Tiene anemia drepanoctica.  Toma pldoras anticonceptivas o [place] estrgeno.  Tiene enfermedad heptica por alcoholismo.  Tiene cirrosis en el hgado.  Recibe alimentacin a travs de una vena (nutricin parenteral).  Est gravemente enfermo.  No come ni bebe durante un tiempo prolongado. Esto se conoce tambin como ayuno.  Es obeso.  Pierde peso demasiado rpido.  Est embarazada.  Tiene niveles altos de grasas (triglicridos) en Engineer, agricultural.  Tiene pancreatitis. Cules son los signos o sntomas? Los sntomas de esta afeccin incluyen:  Dolor en el abdomen, especialmente en la zona superior derecha del abdomen.  Sensibilidad o meteorismo en el abdomen.  Nuseas.  Vmitos.  Grant Ruts.  Escalofros. Cmo se diagnostica? Esta afeccin se diagnostica mediante los antecedentes mdicos y un examen fsico. Tambin pueden hacerle otras pruebas, incluidas las siguientes:  Pruebas de diagnstico por imgenes, por ejemplo: ? Ecografa de la vescula biliar. ? Exploracin por tomografa computarizada (TC) del abdomen. ? Gammagrafa hepatobiliar con cido iminodiactico (HIDA). Este estudio permite que el mdico observe el trnsito de la bilis desde el hgado a la vescula biliar y Elaina Hoops el intestino delgado. ? Una resonancia magntica (RM).  Anlisis de [place], tales como: ? Un hemograma completo. La cantidad de glbulos blancos puede ser ms elevada de lo normal. ? Pruebas funcionales hepticas. Ciertos tipos de clculos biliares hacen que algunos resultados sean ms altos de lo normal. Cmo se trata? El tratamiento puede incluir:  [place] para extirpar la vescula biliar (colecistectoma).  Administracin de antibiticos, generalmente a travs de una va intravenosa.  Hacer ayuno durante cierto tiempo.  Administracin de lquidos intravenosos (i.v.).  Medicamentos para tratar Chief Technology Officer o los vmitos. Siga estas indicaciones en su casa:  Si le realizaron [place], siga las indicaciones de su mdico acerca del cuidado en el hogar despus del procedimiento. Medicamentos   [employer] de venta libre y los recetados solamente como se lo haya indicado el mdico.  Si le recetaron un antibitico, tmelo como se lo haya indicado el mdico. No deje de tomar el antibitico, aunque comience a sentirse mejor. Indicaciones generales  Siga las indicaciones del mdico respecto de qu comer o beber. Cuando le  permitan comer, no coma ni beba nada que desencadene los sntomas.  No levante ningn objeto que pese ms de 10libras (4,5kg) o el lmite de peso que le hayan indicado, hasta que el mdico le diga que puede [place].  No consuma ningn producto que contenga nicotina o tabaco, como cigarrillos y Administrator, Civil Service. Si necesita ayuda para dejar de fumar, consulte al mdico.  Concurra a todas las visitas de seguimiento como se lo haya indicado el mdico. Esto es importante. Comunquese con un mdico si:  El dolor no se alivia con los [employer].  Tiene fiebre. Solicite ayuda de inmediato si:  El dolor se desplaza a otra parte del abdomen o a la espalda.  Sigue teniendo sntomas o tiene sntomas nuevos, incluso con Scientist, research (medical). Resumen  La colecistitis es la inflamacin de la vescula biliar.  La causa ms frecuente  de esta afeccin son los clculos biliares. Los clculos biliares pueden obstruir los conductos (vas biliares) que transportan la bilis desde la vescula biliar.  Los sntomas frecuentes son dolor en el abdomen, nuseas, vmitos, fiebre y escalofros.  Esta afeccin se trata con ciruga para extirpar la vescula biliar, medicamentos, ayuno y administracin de lquidos intravenosos (i.v.).  Siga las indicaciones de su mdico en relacin con las comidas y bebidas. No coma nada que desencadene los sntomas. Esta informacin no tiene Marine scientist el consejo del mdico. Asegrese de hacerle al mdico cualquier pregunta que tenga. Document Released: 12/05/2004 Document Revised: 07/31/2017 Document Reviewed: 07/31/2017 Elsevier Patient Education  2020 [employer].

## 2018-12-20 NOTE — Plan of Care (Signed)
Patient lying in bed this morning; no needs expressed. Will continue to monitor.
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
## 2018-12-20 NOTE — Plan of Care (Signed)
Reviewed discharge instructions with patient; copy given. IV removed. Patient ready for discharge.  

## 2018-12-22 LAB — SURGICAL PATHOLOGY

## 2018-12-23 ENCOUNTER — Encounter (HOSPITAL_COMMUNITY): Payer: Self-pay | Admitting: *Deleted

## 2018-12-23 ENCOUNTER — Other Ambulatory Visit: Payer: Self-pay

## 2018-12-23 DIAGNOSIS — R233 Spontaneous ecchymoses: Secondary | ICD-10-CM | POA: Insufficient documentation

## 2018-12-23 DIAGNOSIS — R1033 Periumbilical pain: Secondary | ICD-10-CM | POA: Diagnosis not present

## 2018-12-23 DIAGNOSIS — W501XXA Accidental kick by another person, initial encounter: Secondary | ICD-10-CM | POA: Diagnosis not present

## 2018-12-23 MED ORDER — SODIUM CHLORIDE 0.9% FLUSH
3.0000 mL | Freq: Once | INTRAVENOUS | Status: AC
  Administered 2018-12-24: 3 mL via INTRAVENOUS
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
## 2018-12-24 ENCOUNTER — Encounter (HOSPITAL_COMMUNITY): Payer: Self-pay | Admitting: Emergency Medicine

## 2018-12-24 ENCOUNTER — Emergency Department (HOSPITAL_COMMUNITY)
Admission: EM | Admit: 2018-12-24 | Discharge: 2018-12-24 | Disposition: A | Discharge status: Home / Self Care | Payer: Medicaid Other | Attending: Emergency Medicine | Admitting: Emergency Medicine

## 2018-12-24 ENCOUNTER — Emergency Department (HOSPITAL_COMMUNITY): Payer: Medicaid Other

## 2018-12-24 DIAGNOSIS — R58 Hemorrhage, not elsewhere classified: Secondary | ICD-10-CM

## 2018-12-24 LAB — COMPREHENSIVE METABOLIC PANEL
ALT: 110 U/L — ABNORMAL HIGH (ref 0–44)
AST: 122 U/L — ABNORMAL HIGH (ref 15–41)
Albumin: 4.3 g/dL (ref 3.5–5.0)
Alkaline Phosphatase: 82 U/L (ref 38–126)
Anion gap: 7 (ref 5–15)
BUN: <5 mg/dL — ABNORMAL LOW (ref 6–20)
CO2: 24 mmol/L (ref 22–32)
Calcium: 9 mg/dL (ref 8.9–10.3)
Chloride: 106 mmol/L (ref 98–111)
Creatinine, Ser: 0.48 mg/dL (ref 0.44–1.00)
GFR calc Af Amer: >60 mL/min (ref >60)
GFR calc non Af Amer: >60 mL/min (ref >60)
Glucose, Bld: 102 mg/dL — ABNORMAL HIGH (ref 70–99)
Potassium: 4.2 mmol/L (ref 3.5–5.1)
Sodium: 137 mmol/L (ref 135–145)
Total Bilirubin: 0.4 mg/dL (ref 0.3–1.2)
Total Protein: 7.4 g/dL (ref 6.5–8.1)

## 2018-12-24 LAB — URINALYSIS, ROUTINE W REFLEX MICROSCOPIC
Bilirubin Urine: NEGATIVE
Glucose, UA: NEGATIVE mg/dL
Hgb urine dipstick: NEGATIVE
Ketones, ur: NEGATIVE mg/dL
Leukocytes,Ua: NEGATIVE
Nitrite: NEGATIVE
Protein, ur: NEGATIVE mg/dL
Specific Gravity, Urine: 1.002 — ABNORMAL LOW (ref 1.005–1.030)
pH: 7 (ref 5.0–8.0)

## 2018-12-24 LAB — LIPASE, BLOOD: Lipase: 30 U/L (ref 11–51)

## 2018-12-24 LAB — CBC
HCT: 35.4 % — ABNORMAL LOW (ref 36.0–46.0)
Hemoglobin: 11.6 g/dL — ABNORMAL LOW (ref 12.0–15.0)
MCH: 29.9 pg (ref 26.0–34.0)
MCHC: 32.8 g/dL (ref 30.0–36.0)
MCV: 91.2 fL (ref 80.0–100.0)
Platelets: 273 10*3/uL (ref 150–400)
RBC: 3.88 MIL/uL (ref 3.87–5.11)
RDW: 13.1 % (ref 11.5–15.5)
WBC: 8.8 10*3/uL (ref 4.0–10.5)
nRBC: 0 % (ref 0.0–0.2)

## 2018-12-24 LAB — ACETAMINOPHEN LEVEL: Acetaminophen (Tylenol), Serum: <10 ug/mL — ABNORMAL LOW (ref 10–30)

## 2018-12-24 LAB — I-STAT BETA HCG BLOOD, ED (MC, WL, AP ONLY): I-stat hCG, quantitative: <5 m[IU]/mL (ref <5)

## 2018-12-24 MED ORDER — SODIUM CHLORIDE (PF) 0.9 % IJ SOLN
INTRAMUSCULAR | Status: AC
  Filled 2018-12-24: qty 50

## 2018-12-24 MED ORDER — IOHEXOL 300 MG/ML  SOLN
100.0000 mL | Freq: Once | INTRAMUSCULAR | Status: AC | PRN
  Administered 2018-12-24: 100 mL via INTRAVENOUS

## 2018-12-24 MED ORDER — OXYCODONE-ACETAMINOPHEN 5-325 MG PO TABS: 1.0000 | ORAL_TABLET | Freq: Four times a day (QID) | ORAL | 0 refills | Status: DC | PRN

## 2018-12-24 MED ORDER — ACETAMINOPHEN 500 MG PO TABS
1000.0000 mg | ORAL_TABLET | Freq: Once | ORAL | Status: AC
  Administered 2018-12-24: 1000 mg via ORAL
  Filled 2018-12-24: qty 2
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
## 2018-12-24 NOTE — ED Provider Notes (Signed)
Patient discharged by Dr. Randal Buba at the end of her shift.  Prior to the patient leaving the emergency department, she informed the nurse that she was out of her pain medication which she was given for her gallbladder surgery.  I have agreed to prescribe 10 Percocet which she can take.  If she is having additional problems or continuing pain, she needs to follow-up with her surgeon to discuss.  I have checked the [facility] and patient has only filled 2 opioid prescriptions in the past 2 years.   Veryl Speak, MD 12/24/18 [phone]

## 2018-12-24 NOTE — ED Notes (Signed)
Wall-e placed in patient's room. This Probation officer provided patient with ice pack for her abdomen.

## 2018-12-24 NOTE — ED Notes (Signed)
Patient transported to CT 

## 2019-05-31 ENCOUNTER — Ambulatory Visit: Payer: Medicaid Other | Attending: Internal Medicine

## 2019-05-31 DIAGNOSIS — Z20822 Contact with and (suspected) exposure to covid-19: Secondary | ICD-10-CM

## 2019-06-01 ENCOUNTER — Telehealth: Payer: Self-pay | Admitting: *Deleted

## 2019-06-01 LAB — NOVEL CORONAVIRUS, NAA: SARS-CoV-2, NAA: NOT DETECTED

## 2019-06-01 LAB — SARS-COV-2, NAA 2 DAY TAT

## 2019-06-01 NOTE — Telephone Encounter (Signed)
Pt notified of negative covid 19 test results, using interpreter # [phone]. Pt voiced understanding.

## 2019-10-25 ENCOUNTER — Other Ambulatory Visit: Payer: Self-pay | Admitting: Student

## 2019-11-04 ENCOUNTER — Telehealth: Payer: Self-pay | Admitting: Family Medicine

## 2019-11-04 NOTE — Telephone Encounter (Signed)
Patient has requested a call back to ask about some medications to help her sleep.

## 2019-11-08 NOTE — Telephone Encounter (Addendum)
Called pt with [facility] interpreter [place] ID [phone]. Pt's husband answered the phone and states that Jamiah is not at home at this time but should return home in a few hours. Explained I will call again later this afternoon.   Called pt with [facility] interpreter [place] ID [phone]. Pt states she would like a medication to help with sleep. Pt is not currently being seen by a mental health provider. States she was taking a medication that helped with sleep but cannot remember who prescribed this. Per chart review pt has established care with [facility]. Agreeable to following up with Asher Muir at this time. Explained that Asher Muir may have pt follow up elsewhere if she is unable to provide pt with the services she needs.   Appt scheduled for 11/10/19 at 1415 and pt notified.

## 2019-11-10 ENCOUNTER — Ambulatory Visit (INDEPENDENT_AMBULATORY_CARE_PROVIDER_SITE_OTHER): Payer: Medicaid Other | Admitting: Clinical

## 2019-11-10 DIAGNOSIS — F4329 Adjustment disorder with other symptoms: Secondary | ICD-10-CM

## 2019-11-10 DIAGNOSIS — F4381 Prolonged grief disorder: Secondary | ICD-10-CM

## 2019-11-10 DIAGNOSIS — Z8659 Personal history of other mental and behavioral disorders: Secondary | ICD-10-CM

## 2019-11-10 DIAGNOSIS — F4321 Adjustment disorder with depressed mood: Secondary | ICD-10-CM

## 2019-11-10 DIAGNOSIS — F4323 Adjustment disorder with mixed anxiety and depressed mood: Secondary | ICD-10-CM

## 2019-11-10 NOTE — BH Specialist Note (Signed)
Integrated Behavioral Health via Telemedicine Phone Visit  11/10/2019 Fahmida Jurich [id number]  Number of Integrated Behavioral Health visits: 1 (4 total) Session Start time: 2:16  Session End time: 3:09 Total time: 53 minutes  Referring Provider: Luna Kitchens, CNM Type of Visit: Phone Patient/Family location: Home [facility] Provider location: [facility] for [facility]  All persons participating in visit: Patient [name] Hale and [facility] Hulda Marin    Discussed confidentiality: Yes   I connected with [name] and Spanish interpreter Alecia Lemming, [phone] by a video enabled telemedicine application ([employer]) and verified that I am speaking with the correct person using two identifiers.    I discussed that engaging in this virtual visit, they consent to the provision of behavioral healthcare and the services will be billed under their insurance.   Patient and/or legal guardian expressed understanding and consented to virtual visit: Yes   PRESENTING CONCERNS: Patient and/or family reports the following symptoms/concerns: Pt states her primary concern today is feeling an increase in depression and anxiety after she stopped taking Zoloft, that began after miscarriage in 2017, and requests refills. Pt does not know her  PCP and agrees to referral to psychiatry.  Duration of problem: Increase in symptoms since Zoloft ran out; Severity of problem: moderately severe  STRENGTHS (Protective Factors/Coping Skills): Supportive husband; self-awareness; open to treatment  GOALS ADDRESSED: Patient will: 1.  Reduce symptoms of: anxiety and depression  2.  Demonstrate ability to: Increase motivation to adhere to plan of care and Improve medication compliance  INTERVENTIONS: Interventions utilized:  Motivational Interviewing, Medication Monitoring and Psychoeducation and/or Health Education Standardized Assessments completed: GAD-7  and PHQ 9  ASSESSMENT: Patient currently experiencing Adjustment disorder with mixed anxiety and depression, History of PTSD, and Complicated grief   Patient may benefit from psychoeducation and brief therapeutic interventions regarding coping with symptoms of anxiety and depression .  PLAN: 1. Follow up with behavioral health clinician on : One week 2. Behavioral recommendations:  -Accept referral to psychiatry -Find Medicaid card (check to see PCP listed on front of card); call number on card if there is a need to change Medicaid plan or PCP listed -Take BH medication as prescribed 3. Referral(s): Integrated Art gallery manager (In Clinic) and [facility] (LME/Outside Clinic)  I discussed the assessment and treatment plan with the patient and/or parent/guardian. They were provided an opportunity to ask questions and all were answered. They agreed with the plan and demonstrated an understanding of the instructions.   They were advised to call back or seek an in-person evaluation if the symptoms worsen or if the condition fails to improve as anticipated.   Confirmed patient's address: Yes  Confirmed patient's phone number: Yes  Any changes to demographics: No   Confirmed patient's insurance: Yes  Any changes to patient's insurance: Yes   I discussed the limitations of evaluation and management by telemedicine and the availability of in person appointments.  I discussed that the purpose of this visit is to provide behavioral health care while limiting exposure to the novel coronavirus.   Discussed there is a possibility of technology failure and discussed alternative modes of communication if that failure occurs.  [name] [facility]  Depression screen [facility] 2/9 11/10/2019 03/10/2018 09/24/2017 09/18/2017 09/05/2017  Decreased Interest 3 0 0 0 0  Down, Depressed, Hopeless 3 1 0 0 0  PHQ - 2 Score 6 1 0 0 0  Altered sleeping 1 0 0 0 0  Tired, decreased  energy 3 0  0 0 0  Change in appetite 1 0 0 0 0  Feeling bad or failure about yourself  1 0 0 0 0  Trouble concentrating 2 0 0 0 0  Moving slowly or fidgety/restless 1 0 0 0 0  Suicidal thoughts 0 0 0 0 0  PHQ-9 Score 15 1 0 0 0  Some recent data might be hidden   GAD 7 : Generalized Anxiety Score 11/10/2019 03/10/2018 09/24/2017 09/18/2017  Nervous, Anxious, on Edge 1 0 0 0  Control/stop worrying 1 0 0 0  Worry too much - different things 1 0 0 0  Trouble relaxing 3 0 0 0  Restless 1 0 0 0  Easily annoyed or irritable 3 0 0 0  Afraid - awful might happen 1 0 0 0  Total GAD 7 Score 11 0 0 0

## 2019-11-11 ENCOUNTER — Other Ambulatory Visit: Payer: Self-pay | Admitting: *Deleted

## 2019-11-11 ENCOUNTER — Other Ambulatory Visit: Payer: Self-pay | Admitting: Obstetrics and Gynecology

## 2019-11-11 MED ORDER — SERTRALINE HCL 50 MG PO TABS: 50.0000 mg | ORAL_TABLET | Freq: Every day | ORAL | 3 refills | Status: DC

## 2019-11-11 NOTE — Progress Notes (Signed)
Rx for zoloft resent to pharmacy today.  It was previously printing and could not be sent by provider.   LM on VM making pt aware.

## 2019-11-12 ENCOUNTER — Other Ambulatory Visit: Payer: Self-pay | Admitting: Student

## 2019-12-07 ENCOUNTER — Telehealth (HOSPITAL_COMMUNITY): Payer: Self-pay | Admitting: Licensed Clinical Social Worker

## 2019-12-07 ENCOUNTER — Ambulatory Visit (HOSPITAL_COMMUNITY): Payer: Medicaid Other | Admitting: Licensed Clinical Social Worker

## 2019-12-07 ENCOUNTER — Other Ambulatory Visit: Payer: Self-pay

## 2019-12-07 NOTE — Telephone Encounter (Signed)
LCSW sent video link via text for 8a session. When pt failed to sign on LCSW phoned for interpreter, Elane Fritz ID# [id number], to call pt. Pt answered the call. She was out at the store shopping. LCSW provided information on rescheduling as desired via interpreter. Pt verbalizes understanding.

## 2020-03-14 ENCOUNTER — Other Ambulatory Visit: Payer: Self-pay | Admitting: Obstetrics and Gynecology

## 2020-04-20 ENCOUNTER — Other Ambulatory Visit: Payer: Self-pay | Admitting: Obstetrics & Gynecology

## 2020-04-20 ENCOUNTER — Telehealth: Payer: Self-pay | Admitting: Clinical

## 2020-04-20 ENCOUNTER — Telehealth: Payer: Self-pay | Admitting: Family Medicine

## 2020-04-20 DIAGNOSIS — F4323 Adjustment disorder with mixed anxiety and depressed mood: Secondary | ICD-10-CM

## 2020-04-20 DIAGNOSIS — F4321 Adjustment disorder with depressed mood: Secondary | ICD-10-CM

## 2020-04-20 MED ORDER — SERTRALINE HCL 50 MG PO TABS: 50.0000 mg | ORAL_TABLET | Freq: Every day | ORAL | 3 refills | Status: DC

## 2020-04-20 NOTE — Telephone Encounter (Signed)
Pt states she needs to speak with someone about medication that she was taking every night before she went to bed, pt states she has been out of the medicine for a month but she feels like she is going crazy

## 2020-04-20 NOTE — Progress Notes (Signed)
Meds ordered this encounter  Medications  . DISCONTD: sertraline (ZOLOFT) 50 MG tablet    Sig: Take 1 tablet (50 mg total) by mouth daily.    Dispense:  30 tablet    Refill:  3  . sertraline (ZOLOFT) 50 MG tablet    Sig: Take 1 tablet (50 mg total) by mouth daily.    Dispense:  30 tablet    Refill:  3

## 2020-04-20 NOTE — Telephone Encounter (Signed)
Pt is requesting refill of Zoloft 50mg ; has been out for one month, since prescription expired. Pt had an appointment at [facility] on 12/07/19, and visit was cancelled due to pt shopping at the time of the visit. Pt was told to reschedule, but pt says she was unable to reschedule/was confused about where to call.   Pt is given number for [facility] to attempt to call to reschedule, and [facility] will also put in a second referral. [facility] will put in a refill, and pt is aware she may walk into the Behavioral Health Urgent Care if she experiences a [facility] emergency at any time.   If pt has any problems obtaining an appointment, she is encouraged to call back [facility] Kaliya Shreiner at [phone]. Pt agrees to the above.

## 2020-04-20 NOTE — Telephone Encounter (Signed)
Pt's call was transferred to me and an interpreter was on the mine to assist. She stated that she has been feeling sad, desperate and anguished for the past 1-2 months. She does not understand why she is feeling this way. She frequently becomes very angry with her children and does not want to hurt them. Pt also stated that she has been out of her medication (Zoloft) for awhile and did not know what to do. I confirmed with pt that she does not feel as though she will hurt herself or others @ this time. I began telling pt of the services and location for the Behavioral Health Urgent Care Center. At that point, Hulda Marin became available to speak with pt. Total time spent speaking with pt = 25 minutes.

## 2020-05-25 ENCOUNTER — Ambulatory Visit (INDEPENDENT_AMBULATORY_CARE_PROVIDER_SITE_OTHER): Payer: Medicaid Other | Admitting: Professional

## 2020-05-25 ENCOUNTER — Telehealth (HOSPITAL_COMMUNITY): Payer: Self-pay | Admitting: Professional

## 2020-05-25 ENCOUNTER — Other Ambulatory Visit: Payer: Self-pay

## 2020-05-25 DIAGNOSIS — F4310 Post-traumatic stress disorder, unspecified: Secondary | ICD-10-CM

## 2020-05-25 DIAGNOSIS — F339 Major depressive disorder, recurrent, unspecified: Secondary | ICD-10-CM | POA: Insufficient documentation

## 2020-05-25 DIAGNOSIS — F330 Major depressive disorder, recurrent, mild: Secondary | ICD-10-CM
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
## 2020-05-25 NOTE — Telephone Encounter (Signed)
See call log 

## 2020-06-12 ENCOUNTER — Other Ambulatory Visit: Payer: Self-pay

## 2020-06-12 ENCOUNTER — Ambulatory Visit (HOSPITAL_COMMUNITY): Payer: Medicaid Other | Admitting: Professional

## 2020-06-12 ENCOUNTER — Telehealth (HOSPITAL_COMMUNITY): Payer: Self-pay | Admitting: Professional

## 2020-06-12 NOTE — Telephone Encounter (Signed)
See call log 

## 2020-08-28 ENCOUNTER — Other Ambulatory Visit: Payer: Self-pay | Admitting: Obstetrics & Gynecology

## 2020-08-28 DIAGNOSIS — F4321 Adjustment disorder with depressed mood: Secondary | ICD-10-CM

## 2020-09-08 ENCOUNTER — Emergency Department (HOSPITAL_COMMUNITY)
Admission: EM | Admit: 2020-09-08 | Discharge: 2020-09-08 | Disposition: A | Discharge status: Home / Self Care | Payer: Medicaid Other | Attending: Emergency Medicine | Admitting: Emergency Medicine

## 2020-09-08 ENCOUNTER — Other Ambulatory Visit: Payer: Self-pay

## 2020-09-08 ENCOUNTER — Encounter (HOSPITAL_COMMUNITY): Payer: Self-pay

## 2020-09-08 DIAGNOSIS — J029 Acute pharyngitis, unspecified: Secondary | ICD-10-CM | POA: Diagnosis not present

## 2020-09-08 DIAGNOSIS — Z20822 Contact with and (suspected) exposure to covid-19: Secondary | ICD-10-CM | POA: Insufficient documentation

## 2020-09-08 DIAGNOSIS — R070 Pain in throat: Secondary | ICD-10-CM | POA: Diagnosis present

## 2020-09-08 LAB — GROUP A STREP BY PCR: Group A Strep by PCR: NOT DETECTED

## 2020-09-08 LAB — RESP PANEL BY RT-PCR (FLU A&B, COVID) ARPGX2
Influenza A by PCR: NEGATIVE
Influenza B by PCR: NEGATIVE
SARS Coronavirus 2 by RT PCR: NEGATIVE

## 2020-09-08 MED ORDER — DEXAMETHASONE SODIUM PHOSPHATE 4 MG/ML IJ SOLN
4.0000 mg | Freq: Once | INTRAMUSCULAR | Status: AC
  Administered 2020-09-08: 4 mg via INTRAMUSCULAR
  Filled 2020-09-08: qty 1

## 2020-09-08 MED ORDER — LIDOCAINE VISCOUS HCL 2 % MT SOLN: 5.0000 mL | Freq: Three times a day (TID) | OROMUCOSAL | 0 refills | Status: DC

## 2020-09-08 MED ORDER — LIDOCAINE VISCOUS HCL 2 % MT SOLN
15.0000 mL | Freq: Once | OROMUCOSAL | Status: AC
  Administered 2020-09-08: 15 mL via OROMUCOSAL
  Filled 2020-09-08: qty 15

## 2020-09-08 MED ORDER — KETOROLAC TROMETHAMINE 30 MG/ML IJ SOLN
30.0000 mg | Freq: Once | INTRAMUSCULAR | Status: AC
  Administered 2020-09-08: 30 mg via INTRAMUSCULAR
  Filled 2020-09-08: qty 1
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
## 2020-09-08 NOTE — Discharge Instructions (Addendum)
Please followPlease take Tylenol and ibuprofen as discussed below.  Drink plenty of water.  Also use Magic mouthwash I prescribed you.  The [facility] and wellness clinic and receive another primary care doctor he can follow-up with.

## 2020-09-08 NOTE — ED Triage Notes (Signed)
Pt to ED from home with c/o sore throat progressively getting worse since Sunday. Pt reports that she now has ear pain and a headache, states throat is very painful. Pt states she has been taking  motrin and tylenol and it has helped.

## 2020-09-12 ENCOUNTER — Emergency Department (HOSPITAL_COMMUNITY)
Admission: EM | Admit: 2020-09-12 | Discharge: 2020-09-12 | Disposition: A | Discharge status: Home / Self Care | Payer: Medicaid Other | Attending: Emergency Medicine | Admitting: Emergency Medicine

## 2020-09-12 ENCOUNTER — Encounter (HOSPITAL_COMMUNITY): Payer: Self-pay | Admitting: *Deleted

## 2020-09-12 DIAGNOSIS — J069 Acute upper respiratory infection, unspecified: Secondary | ICD-10-CM | POA: Diagnosis not present

## 2020-09-12 DIAGNOSIS — M791 Myalgia, unspecified site: Secondary | ICD-10-CM | POA: Insufficient documentation

## 2020-09-12 DIAGNOSIS — R110 Nausea: Secondary | ICD-10-CM | POA: Insufficient documentation

## 2020-09-12 DIAGNOSIS — R059 Cough, unspecified: Secondary | ICD-10-CM | POA: Diagnosis present

## 2020-09-12 MED ORDER — GUAIFENESIN-CODEINE 100-10 MG/5ML PO SOLN: 5.0000 mL | Freq: Three times a day (TID) | ORAL | 0 refills | Status: DC | PRN

## 2020-09-12 MED ORDER — ONDANSETRON 4 MG PO TBDP: 4.0000 mg | ORAL_TABLET | Freq: Three times a day (TID) | ORAL | 0 refills | Status: DC | PRN

## 2020-09-12 MED ORDER — BENZONATATE 100 MG PO CAPS: 100.0000 mg | ORAL_CAPSULE | Freq: Three times a day (TID) | ORAL | 0 refills | Status: DC

## 2020-09-12 NOTE — Discharge Instructions (Addendum)
Take medications to help with your symptoms. Follow-up with your primary care provider. Tylenol and ibuprofen as needed for pain. Return to the ER if you start to experience worsening cough, vomiting, chest pain or shortness of breath   Tome medicamentos para ayudar con sus sntomas. Seguimiento con su proveedor de Marine scientist. Tylenol e ibuprofeno segn sea necesario para el dolor. Regrese a la sala de emergencias si comienza a experimentar empeoramiento de la tos, vmitos, dolor en el pecho o dificultad para respirar
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
## 2020-09-12 NOTE — ED Triage Notes (Signed)
Pt complains of sore throat, was seen last week and given medication for sore throat. She feels like her symptoms are getting worse. The pain is making it difficult to sleep, also has headache.

## 2020-09-27 ENCOUNTER — Encounter: Payer: Self-pay | Admitting: Physician Assistant

## 2020-09-27 ENCOUNTER — Other Ambulatory Visit: Payer: Self-pay

## 2020-09-27 ENCOUNTER — Telehealth: Payer: Self-pay

## 2020-09-27 ENCOUNTER — Ambulatory Visit: Payer: Medicaid Other | Attending: Physician Assistant | Admitting: Physician Assistant

## 2020-09-27 DIAGNOSIS — H6983 Other specified disorders of Eustachian tube, bilateral: Secondary | ICD-10-CM

## 2020-09-27 DIAGNOSIS — Z789 Other specified health status: Secondary | ICD-10-CM | POA: Diagnosis not present

## 2020-09-27 DIAGNOSIS — J029 Acute pharyngitis, unspecified: Secondary | ICD-10-CM

## 2020-09-27 DIAGNOSIS — Z09 Encounter for follow-up examination after completed treatment for conditions other than malignant neoplasm: Secondary | ICD-10-CM

## 2020-09-27 MED ORDER — FLUTICASONE PROPIONATE 50 MCG/ACT NA SUSP: 2.0000 | Freq: Every day | NASAL | 6 refills | Status: DC

## 2020-09-27 MED ORDER — LIDOCAINE VISCOUS HCL 2 % MT SOLN: 5.0000 mL | Freq: Three times a day (TID) | OROMUCOSAL | 0 refills | Status: DC

## 2020-09-27 NOTE — Telephone Encounter (Signed)
Spoke with Psychologist, occupational and she was advised of Sig and ratio below

## 2020-09-27 NOTE — Progress Notes (Signed)
Patient ID: [name] Hale, female   DOB: [birth year], 40 y.o.   MRN: [id number] Virtual Visit via Telephone Note  I connected with [name] Hale on 09/27/20 at  9:30 AM EDT by telephone and verified that I am speaking with the correct person using two identifiers.  Location: Patient: home Provider: [facility] office [facility] interpreter   I discussed the limitations, risks, security and privacy concerns of performing an evaluation and management service by telephone and the availability of in person appointments. I also discussed with the patient that there may be a patient responsible charge related to this service. The patient expressed understanding and agreed to proceed.   History of Present Illness: After ED visit 09/12/2020 with URI.  Sometimes ear congestion and some sore throat.  She is continuing to have some sore throat as well.  No cough.  No fever.  Strep test and covid screening negative at ED.  No body aches.  From ED note:  [name] Hale is a 40 y.o. female presenting to the ED with a chief complaint of cough, body aches and nausea.  States that she was seen and evaluated few days ago for sore throat.  States that her sore throat has improved but now she continues to have a cough, nausea, decreased appetite, myalgias for the past few days.  In total she has been feeling ill for about 1 week.  She has taken the lidocaine to help with her sore throat.  States that she is having trouble sleeping at night secondary to the cough.  Denies any vomiting.  No diarrhea.  She had a negative COVID and strep test when she was evaluated here last.  No sick contacts with similar symptoms.  Denies any chest pain or shortness of breath.  From A/P: 40 year old female presenting to the ED with a chief complaint of cough, body aches, nausea and myalgias.  Her sore throat has improved since she was last seen as she has been taking lidocaine.  In total she has been feeling ill for over 1  week.  She is requesting medications to help with her cough and nausea.  Denies any abdominal pain, chest pain, vomiting or diarrhea.  On exam abdomen is soft.  Lungs are clear to auscultation bilaterally.  Her oxygen saturations are 100% on room air.  No signs of respiratory distress.  Advised patient that her symptoms are most likely due to a virus.  Offered repeat COVID testing but she is out of quarantine window and informed her that treatment is the same regardless of if this is COVID or other viral infection.  We will treat with antitussives, antiemetics and have her continue Tylenol or Motrin as needed for pain and body aches.  Return precautions given.     Patient is hemodynamically stable, in NAD, and able to ambulate in the ED. Evaluation does not show pathology that would require ongoing emergent intervention or inpatient treatment. I explained the diagnosis to the patient. Pain has been managed and has no complaints prior to discharge. Patient is comfortable with above plan and is stable for discharge at this time. All questions were answered prior to disposition. Strict return precautions for returning to the ED were discussed. Encouraged follow up with PCP.    Prior to providing a prescription for a controlled substance, I independently reviewed the patient's recent prescription history on the [place] Controlled Substance Reporting System. The patient had no recent or regular prescriptions and was deemed appropriate for a brief, less than 3  day prescription of narcotic for acute analgesia.   Observations/Objective:   Assessment and Plan: 1. Dysfunction of both eustachian tubes May use sudafed.   - fluticasone (FLONASE) 50 MCG/ACT nasal spray; Place 2 sprays into both nostrils daily.  Dispense: 16 g; Refill: 6 - Ambulatory referral to ENT  2. Sore throat Salt water gargles - magic mouthwash (lidocaine, diphenhydrAMINE, alum & mag hydroxide) suspension; Swish and swallow 5 mLs 3  (three) times daily.  Dispense: 360 mL; Refill: 0 - Ambulatory referral to ENT  3. Language barrier pacific interpreters used and additional time performing visit was required.   4. Encounter for examination following treatment at hospital   Follow Up Instructions: Assign PCP in 2-3 months   I discussed the assessment and treatment plan with the patient. The patient was provided an opportunity to ask questions and all were answered. The patient agreed with the plan and demonstrated an understanding of the instructions.   The patient was advised to call back or seek an in-person evaluation if the symptoms worsen or if the condition fails to improve as anticipated.  I provided 14 minutes of non-face-to-face time during this encounter.   [name] Co, PA-C

## 2020-09-27 NOTE — Telephone Encounter (Signed)
Pharmacy called and wanted to get the ratio for the magic mouthwash (lidocaine, diphenhydrAMINE, alum & mag hydroxide) suspension  They also stated the dose sent is for 24 day supply but mouthwash only last 14 days so the rest will need to be discarded / they want to know if the provider wants to lower the amount to 14 day supply / please advise

## 2020-09-27 NOTE — Telephone Encounter (Signed)
[facility] on [address] in [place] [address] is calling and [facility] [address] is unable to compound magic mouthwash. Yolotzin Fish farm manager at [employer] on [address] is call and needs the ratio for magic mouthwash. Please call [phone]

## 2020-09-27 NOTE — Telephone Encounter (Signed)
I called [facility] and spoke to a pharmacy technician who did not provide her name. Pharmacist was unavailable to take the call. She instructed [place] to call after 2p but we will be seeing patients.   Please let the pharmacist at [facility] know that the ratio is 1:1:1:   lidocaine 2 % Soln 60 mL   diphenhydrAMINE 12.5 MG/5ML Liqd 60 mL   alum & mag hydroxide-simeth 200-200-20 MG/5ML Susp 60 ml    Total ml: 180 with the sig of 5 ml TID.

## 2020-09-28 ENCOUNTER — Telehealth: Payer: Self-pay | Admitting: Physician Assistant

## 2020-09-28 ENCOUNTER — Telehealth: Payer: Self-pay

## 2020-09-28 ENCOUNTER — Other Ambulatory Visit: Payer: Self-pay | Admitting: Physician Assistant

## 2020-09-28 DIAGNOSIS — H6983 Other specified disorders of Eustachian tube, bilateral: Secondary | ICD-10-CM

## 2020-09-28 DIAGNOSIS — J029 Acute pharyngitis, unspecified: Secondary | ICD-10-CM

## 2020-09-28 NOTE — Telephone Encounter (Signed)
  As instructed Called pt made aware of Md results notes and instructions. Verbalized understanding

## 2020-09-28 NOTE — Telephone Encounter (Signed)
Call to pharmacy- Flonase looks like it has already been picked up and magic mouthwash will have to be resent- it is compounded item.  Attempted to call patient with interpreter- left message to call office- need to verify if Rx picked up.

## 2020-09-28 NOTE — Telephone Encounter (Signed)
   Notes to clinic:  Patient requesting that medication be sent to a different pharmacy  [id number] - [place], [place] - [address] AT [facility] OF [facility] [address] [place] [address]   Requested Prescriptions  Pending Prescriptions Disp Refills   magic mouthwash (lidocaine, diphenhydrAMINE, alum & mag hydroxide) suspension 360 mL 0    Sig: Swish and swallow 5 mLs 3 (three) times daily.      This medication is a mixture and was not evaluated by refill protocols.      fluticasone (FLONASE) 50 MCG/ACT nasal spray 16 g 6    Sig: Place 2 sprays into both nostrils daily.      Ear, Nose, and Throat: Nasal Preparations - Corticosteroids Passed - 09/28/2020  1:22 PM      Passed - Valid encounter within last 12 months    Recent Outpatient Visits           Yesterday Dysfunction of both eustachian tubes   [facility] [place], Marzella Schlein, [place]       Future Appointments             In 3 months Claiborne Rigg, NP [facility]

## 2020-09-28 NOTE — Telephone Encounter (Signed)
Medication was sent to the wrong pharmacy. Requesting it to be sent to:  [facility] #[id number] [place], [place] - [address] AT [facility] OF [facility] [address] [place] [address] Phone: [phone] Fax: [phone] Hours: Not open 24 hours

## 2020-09-28 NOTE — Telephone Encounter (Signed)
Copied from CRM [id number]. Topic: General - Other >> Sep 28, 2020 11:02 AM Gaetana Michaelis A wrote: Reason for CRM: Patient has received their prescription for Magic Mouthwash but would like to be prescribed an additional medication to help with throat discomfort   Please contact further when possible

## 2020-09-28 NOTE — Telephone Encounter (Signed)
Medication: magic mouthwash (lidocaine, diphenhydrAMINE, alum & mag hydroxide) suspension [[id number]],  fluticasone (FLONASE) 50 MCG/ACT nasal spray [[id number]] - Pt states it was sent to the wrong pharmacy. Can this be sent to the pharmacy below please  Has the patient contacted their pharmacy? yes (Agent: If no, request that the patient contact the pharmacy for the refill.) (Agent: If yes, when and what did the pharmacy advise?)  Preferred Pharmacy (with phone number or street name): [id number] [place], [place] - [address] AT [facility] OF [facility] & [address] [place] [address] Phone: [phone] Fax: [phone] Hours: Not open 24 hours    Agent: Please be advised that RX refills may take up to 3 business days. We ask that you follow-up with your pharmacy.

## 2020-10-18 ENCOUNTER — Inpatient Hospital Stay: Payer: Medicaid Other | Admitting: Physician Assistant

## 2020-10-18 ENCOUNTER — Ambulatory Visit: Payer: Medicaid Other | Admitting: Physician Assistant

## 2020-11-01 ENCOUNTER — Ambulatory Visit: Payer: Self-pay | Admitting: *Deleted

## 2020-11-01 DIAGNOSIS — J029 Acute pharyngitis, unspecified: Secondary | ICD-10-CM

## 2020-11-01 NOTE — Telephone Encounter (Signed)
Interpreter: [phone] Patient is calling to request a RF of the magic mouthwash prescribed by office PA during virtual visit- problem visit on 7/20. Patient states she still has pain under bilateral ears at jaw line and pain with swallowing. She states the magic mouthwash was a great help with her symptoms and she is requesting a RF. Patient did mention a referral- but it looks like referral was placed for ENT. Patient has NP appointment 9/30- patient is on wait list for sooner appointment. Patient is requesting RF.

## 2020-11-01 NOTE — Telephone Encounter (Signed)
Reason for Disposition  [1] Prescription refill request for NON-ESSENTIAL medicine (i.e., no harm to patient if med not taken) AND [2] triager unable to refill per department policy  Answer Assessment - Initial Assessment Questions 1. LOCATION: "Which ear is involved?"     Behind ear at jaw line and swallowing pain- both sides 2. ONSET: "When did the ear start hurting"      Since ED 3. SEVERITY: "How bad is the pain?"  (Scale 1-10; mild, moderate or severe)   - MILD (1-3): doesn't interfere with normal activities    - MODERATE (4-7): interferes with normal activities or awakens from sleep    - SEVERE (8-10): excruciating pain, unable to do any normal activities      severe 4. URI SYMPTOMS: "Do you have a runny nose or cough?"     Throat pain 5. FEVER: "Do you have a fever?" If Yes, ask: "What is your temperature, how was it measured, and when did it start?"     no 6. CAUSE: "Have you been swimming recently?", "How often do you use Q-TIPS?", "Have you had any recent air travel or scuba diving?"     Recently treated at ED  7. OTHER SYMPTOMS: "Do you have any other symptoms?" (e.g., headache, stiff neck, dizziness, vomiting, runny nose, decreased hearing)     Sore throat 8. PREGNANCY: "Is there any chance you are pregnant?" "When was your last menstrual period?"     No- LMP-last week  Answer Assessment - Initial Assessment Questions 1. DRUG NAME: "What medicine do you need to have refilled?"     Magic mouthwash 2. REFILLS REMAINING: "How many refills are remaining?" (Note: The label on the medicine or pill bottle will show how many refills are remaining. If there are no refills remaining, then a renewal may be needed.)     none 3. EXPIRATION DATE: "What is the expiration date?" (Note: The label states when the prescription will expire, and thus can no longer be refilled.)     N/a 4. PRESCRIBING HCP: "Who prescribed it?" Reason: If prescribed by specialist, call should be referred to that  group.     [facility]  5. SYMPTOMS: "Do you have any symptoms?"     Patient reports same symptoms 6. PREGNANCY: "Is there any chance that you are pregnant?" "When was your last menstrual period?"     no  Protocols used: Earache-A-AH, Medication Refill and Renewal Call-A-AH

## 2020-11-02 MED ORDER — LIDOCAINE VISCOUS HCL 2 % MT SOLN: 5.0000 mL | Freq: Three times a day (TID) | OROMUCOSAL | 0 refills | Status: DC

## 2020-11-02 NOTE — Addendum Note (Signed)
Addended by: Hoy Register on: 11/02/2020 05:43 PM   Modules accepted: Orders

## 2020-11-02 NOTE — Telephone Encounter (Signed)
Pt has not est care with a PCP she last saw [facility]. Pt requesting refill on magic mouthwash.

## 2020-11-02 NOTE — Telephone Encounter (Signed)
I have refilled it but prescription is not going electronically and may need to be printed and faxed.

## 2020-12-08 ENCOUNTER — Encounter: Payer: Self-pay | Admitting: Nurse Practitioner

## 2020-12-08 ENCOUNTER — Other Ambulatory Visit: Payer: Self-pay

## 2020-12-08 ENCOUNTER — Ambulatory Visit: Payer: Medicaid Other | Attending: Nurse Practitioner | Admitting: Nurse Practitioner

## 2020-12-08 VITALS — BP 103/66 | HR 70 | Resp 16 | Wt 136.2 lb

## 2020-12-08 DIAGNOSIS — M542 Cervicalgia: Secondary | ICD-10-CM

## 2020-12-08 DIAGNOSIS — F32A Depression, unspecified: Secondary | ICD-10-CM

## 2020-12-08 DIAGNOSIS — F419 Anxiety disorder, unspecified: Secondary | ICD-10-CM | POA: Diagnosis not present

## 2020-12-08 DIAGNOSIS — F4310 Post-traumatic stress disorder, unspecified: Secondary | ICD-10-CM | POA: Diagnosis not present

## 2020-12-08 DIAGNOSIS — K219 Gastro-esophageal reflux disease without esophagitis: Secondary | ICD-10-CM

## 2020-12-08 DIAGNOSIS — Z91410 Personal history of adult physical and sexual abuse: Secondary | ICD-10-CM | POA: Insufficient documentation

## 2020-12-08 DIAGNOSIS — R42 Dizziness and giddiness: Secondary | ICD-10-CM | POA: Diagnosis not present

## 2020-12-08 DIAGNOSIS — Z9151 Personal history of suicidal behavior: Secondary | ICD-10-CM | POA: Insufficient documentation

## 2020-12-08 DIAGNOSIS — Z7689 Persons encountering health services in other specified circumstances: Secondary | ICD-10-CM | POA: Diagnosis not present

## 2020-12-08 DIAGNOSIS — Z79899 Other long term (current) drug therapy: Secondary | ICD-10-CM | POA: Diagnosis not present

## 2020-12-08 MED ORDER — CYCLOBENZAPRINE HCL 5 MG PO TABS: 5.0000 mg | ORAL_TABLET | Freq: Every day | ORAL | 1 refills | Status: DC

## 2020-12-08 MED ORDER — ESCITALOPRAM OXALATE 10 MG PO TABS: 10.0000 mg | ORAL_TABLET | Freq: Every day | ORAL | 0 refills | Status: DC

## 2020-12-08 MED ORDER — FAMOTIDINE 20 MG PO TABS: 20.0000 mg | ORAL_TABLET | Freq: Every day | ORAL | 2 refills | Status: DC
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
## 2020-12-09 LAB — THYROID PANEL WITH TSH
Free Thyroxine Index: 1.8 (ref 1.2–4.9)
T3 Uptake Ratio: 24 % (ref 24–39)
T4, Total: 7.4 ug/dL (ref 4.5–12.0)
TSH: 2.51 u[IU]/mL (ref 0.450–4.500)

## 2020-12-09 LAB — CMP14+EGFR
ALT: 18 IU/L (ref 0–32)
AST: 12 IU/L (ref 0–40)
Albumin/Globulin Ratio: 1.8 (ref 1.2–2.2)
Albumin: 4.5 g/dL (ref 3.8–4.8)
Alkaline Phosphatase: 70 IU/L (ref 44–121)
BUN/Creatinine Ratio: 10 (ref 9–23)
BUN: 6 mg/dL (ref 6–20)
Bilirubin Total: <0.2 mg/dL (ref 0.0–1.2)
CO2: 20 mmol/L (ref 20–29)
Calcium: 9 mg/dL (ref 8.7–10.2)
Chloride: 102 mmol/L (ref 96–106)
Creatinine, Ser: 0.6 mg/dL (ref 0.57–1.00)
Globulin, Total: 2.5 g/dL (ref 1.5–4.5)
Glucose: 80 mg/dL (ref 70–99)
Potassium: 4.2 mmol/L (ref 3.5–5.2)
Sodium: 139 mmol/L (ref 134–144)
Total Protein: 7 g/dL (ref 6.0–8.5)
eGFR: 118 mL/min/{1.73_m2} (ref >59)

## 2020-12-09 LAB — CBC
Hematocrit: 32.1 % — ABNORMAL LOW (ref 34.0–46.6)
Hemoglobin: 10.1 g/dL — ABNORMAL LOW (ref 11.1–15.9)
MCH: 24.8 pg — ABNORMAL LOW (ref 26.6–33.0)
MCHC: 31.5 g/dL (ref 31.5–35.7)
MCV: 79 fL (ref 79–97)
Platelets: 326 10*3/uL (ref 150–450)
RBC: 4.08 x10E6/uL (ref 3.77–5.28)
RDW: 15.9 % — ABNORMAL HIGH (ref 11.7–15.4)
WBC: 7.8 10*3/uL (ref 3.4–10.8)

## 2020-12-10 ENCOUNTER — Other Ambulatory Visit: Payer: Self-pay | Admitting: Nurse Practitioner

## 2020-12-10 MED ORDER — FERROUS SULFATE 325 (65 FE) MG PO TABS: 325.0000 mg | ORAL_TABLET | Freq: Every day | ORAL | 3 refills | Status: DC

## 2020-12-13 ENCOUNTER — Other Ambulatory Visit: Payer: Self-pay

## 2020-12-13 ENCOUNTER — Emergency Department (HOSPITAL_COMMUNITY): Admission: EM | Admit: 2020-12-13 | Payer: Medicaid Other | Source: Home / Self Care

## 2020-12-29 ENCOUNTER — Ambulatory Visit: Payer: Medicaid Other | Admitting: Nurse Practitioner

## 2021-01-02 ENCOUNTER — Telehealth: Payer: Self-pay | Admitting: Nurse Practitioner

## 2021-01-02 ENCOUNTER — Ambulatory Visit: Payer: Medicaid Other | Attending: Nurse Practitioner | Admitting: Nurse Practitioner

## 2021-01-02 ENCOUNTER — Other Ambulatory Visit: Payer: Self-pay

## 2021-01-02 NOTE — Telephone Encounter (Signed)
No answer LVM x2

## 2021-02-05 ENCOUNTER — Ambulatory Visit: Payer: Medicaid Other | Attending: Family Medicine

## 2021-02-05 ENCOUNTER — Ambulatory Visit: Payer: Medicaid Other | Admitting: Nurse Practitioner

## 2021-02-05 ENCOUNTER — Other Ambulatory Visit: Payer: Self-pay

## 2021-02-05 VITALS — BP 110/73 | HR 57 | Ht <= 58 in | Wt 129.8 lb

## 2021-02-05 DIAGNOSIS — Z23 Encounter for immunization: Secondary | ICD-10-CM | POA: Diagnosis not present

## 2021-03-26 ENCOUNTER — Ambulatory Visit: Payer: Medicaid Other | Admitting: Nurse Practitioner

## 2021-04-02 ENCOUNTER — Encounter: Payer: Self-pay | Admitting: Nurse Practitioner

## 2021-04-02 ENCOUNTER — Ambulatory Visit: Payer: Medicaid Other | Attending: Nurse Practitioner | Admitting: Nurse Practitioner

## 2021-04-02 ENCOUNTER — Other Ambulatory Visit (HOSPITAL_COMMUNITY)
Admission: RE | Admit: 2021-04-02 | Discharge: 2021-04-02 | Disposition: A | Discharge status: Home / Self Care | Payer: Medicaid Other | Source: Ambulatory Visit | Attending: Nurse Practitioner | Admitting: Nurse Practitioner

## 2021-04-02 ENCOUNTER — Other Ambulatory Visit: Payer: Self-pay

## 2021-04-02 VITALS — BP 123/81 | HR 78 | Ht <= 58 in | Wt 133.1 lb

## 2021-04-02 DIAGNOSIS — Z114 Encounter for screening for human immunodeficiency virus [HIV]: Secondary | ICD-10-CM | POA: Diagnosis not present

## 2021-04-02 DIAGNOSIS — Z124 Encounter for screening for malignant neoplasm of cervix: Secondary | ICD-10-CM | POA: Diagnosis present

## 2021-04-02 DIAGNOSIS — F419 Anxiety disorder, unspecified: Secondary | ICD-10-CM | POA: Diagnosis not present

## 2021-04-02 DIAGNOSIS — M542 Cervicalgia: Secondary | ICD-10-CM | POA: Diagnosis not present

## 2021-04-02 DIAGNOSIS — F32A Depression, unspecified: Secondary | ICD-10-CM

## 2021-04-02 DIAGNOSIS — D649 Anemia, unspecified: Secondary | ICD-10-CM

## 2021-04-02 MED ORDER — FERROUS SULFATE 325 (65 FE) MG PO TABS: 325.0000 mg | ORAL_TABLET | Freq: Every day | ORAL | 3 refills | Status: DC

## 2021-04-02 MED ORDER — CYCLOBENZAPRINE HCL 5 MG PO TABS: 5.0000 mg | ORAL_TABLET | Freq: Every day | ORAL | 3 refills | Status: DC

## 2021-04-02 MED ORDER — IBUPROFEN 600 MG PO TABS: 600.0000 mg | ORAL_TABLET | Freq: Three times a day (TID) | ORAL | 0 refills | Status: DC | PRN

## 2021-04-02 MED ORDER — ESCITALOPRAM OXALATE 10 MG PO TABS: 10.0000 mg | ORAL_TABLET | Freq: Every day | ORAL | 1 refills | Status: DC

## 2021-04-03 LAB — CERVICOVAGINAL ANCILLARY ONLY
Bacterial Vaginitis (gardnerella): NEGATIVE
Candida Glabrata: NEGATIVE
Candida Vaginitis: NEGATIVE
Chlamydia: NEGATIVE
Comment: NEGATIVE
Comment: NEGATIVE
Comment: NEGATIVE
Comment: NEGATIVE
Comment: NEGATIVE
Comment: NORMAL
Neisseria Gonorrhea: NEGATIVE
Trichomonas: NEGATIVE

## 2021-04-04 LAB — CYTOLOGY - PAP
Comment: NEGATIVE
Diagnosis: NEGATIVE
High risk HPV: NEGATIVE
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
## 2021-04-05 ENCOUNTER — Telehealth: Payer: Self-pay

## 2021-04-05 NOTE — Telephone Encounter (Signed)
-----   Message from Claiborne Rigg, NP sent at 04/03/2021 11:01 PM EST ----- Vaginal swab negative for any vaginal bacteria, yeast, chlamydia, gonorrhea or trichomonas

## 2021-04-05 NOTE — Telephone Encounter (Signed)
Left message to return call to our office.  With the help interpretor Byrd Hesselbach # [phone].

## 2021-04-11 ENCOUNTER — Telehealth: Payer: Self-pay

## 2021-04-11 NOTE — Telephone Encounter (Signed)
Called patient reviewed all information and repeated back to me. Will call if any questions.  ? ?

## 2021-06-26 ENCOUNTER — Encounter: Payer: Self-pay | Admitting: Physician Assistant

## 2021-06-26 ENCOUNTER — Ambulatory Visit (INDEPENDENT_AMBULATORY_CARE_PROVIDER_SITE_OTHER): Payer: Medicaid Other | Admitting: Physician Assistant

## 2021-06-26 DIAGNOSIS — J029 Acute pharyngitis, unspecified: Secondary | ICD-10-CM | POA: Diagnosis not present

## 2021-06-26 NOTE — Patient Instructions (Signed)
Resistencia a los antibi?ticos ?Antibiotic Resistance ?Los antibi?ticos son medicamentos utilizados para tratar infecciones causadas por bacterias. La resistencia a los antibi?ticos significa que el medicamento una vez usado para tratar una infecci?n ya no es Facilities managereficaz contra las bacterias. ?La resistencia puede desarrollarse cuando los antibi?ticos se administran en respuesta a enfermedades bacterianas que no necesitan antibi?ticos, o cuando se administran antibi?ticos incorrectamente para enfermedades virales, como resfr?os o la gripe (influenza). Tambi?n puede ocurrir cuando una persona [place] los antibi?ticos de un modo incorrecto y algunas de las bacterias infecciosas desarrollan resistencia al antibi?tico. Estas bacterias pueden proliferar y causar infecciones resistentes a diversos tipos de antibi?ticos. Si esto ocurre, las bacterias resistentes pueden transmitir infecciones que son dif?ciles de Warehouse managertratar. ??Cu?les son las causas? ?La resistencia a los antibi?ticos ocurre cuando las bacterias entran en contacto con un antibi?tico Neomia Dearuna y Armed forces training and education officerotra vez. Con el tiempo, las bacterias se vuelven resistentes a ese antibi?tico. ??Qu? incrementa el riesgo? ?Es m?s probable que esta afecci?n se manifieste en las personas que presentan alguna de estas caracter?sticas: ?Deben tomar antibi?ticos de forma repetida para tratar infecciones virales. ?No toman los antibi?ticos exactamente seg?n las indicaciones, por ejemplo, no terminan todo el medicamento. ?Deben tomar antibi?ticos con frecuencia debido a una enfermedad a largo plazo. ?Toman medicamentos que inhiben o debilitan el sistema de defensa del organismo (sistema inmunitario). ?Se someten a una cirug?a o necesitan un procedimiento que reemplaza parte de la funci?n que realizan los ri?ones sanos (di?lisis). ?Son ONEOKadultos mayores. ?Algunos factores de riesgo adicionales son los siguientes: ?Tener un tipo de infecci?n cuya causa m?s probable es una bacteria resistente. Sears Holdings CorporationEntre estas, se  incluyen algunas de las siguientes: ?Infecciones cut?neas. ?ITS (infecciones de transmisi?n sexual). ?Infecciones respiratorias. ?Infecciones de las Enbridge Energymembranas que cubren el cerebro y la m?dula espinal (meningitis). ?Infecciones gastrointestinales. ?Consumir alimentos que provienen de animales tratados con antibi?ticos. Las bacterias resistentes a los antibi?ticos pueden transmitirse a trav?s de los alimentos. ?Recibir un trasplante de ?rgano o recibir tratamiento para el c?ncer. ?Vivir con alguien o cuidar de alguien que tiene una infecci?n resistente a los antibi?ticos. ?Estar hospitalizado por un tiempo prolongado o vivir en un centro de cuidados a largo plazo. ??Cu?les son los signos o s?ntomas? ?El s?ntoma principal de esta afecci?n es tener una infecci?n que no mejora con un tratamiento normal. Los signos y s?ntomas espec?ficos que tenga depender?n del tipo de infecci?n, pero pueden incluir los siguientes: ?Teacher, English as a foreign languageiebre. ?Sensaci?n de calor, enrojecimiento y dolor a la palpaci?n en la zona que rodea una herida o incisi?n. ?Secreci?n de color marr?n, amarillo o verde en una herida o incisi?n. ?Mal olor proveniente del lugar de una herida o incisi?n. ?N?useas, v?mitos o dolor abdominal. ??C?mo se diagnostica? ?Esta afecci?n puede diagnosticarse en funci?n de los s?ntomas y los antecedentes m?dicos. El m?dico puede sospechar una resistencia a los antibi?ticos si la enfermedad no mejora despu?s de haber recibido tratamiento contra la infecci?n. ?Tambi?n pueden hacerle otras pruebas, incluidas las siguientes: ?An?lisis de [place]una muestra de l?quido o materia fecal (heces). Esto se realiza para identificar las bacterias con un microscopio y Chief Strategy Officerdeterminar el tipo de antibi?tico que sea eficaz para destruirlas (cultivo y antibiograma). ?Otros an?lisis de Tajikistansangre y estudios de diagn?stico por im?genes. Estos se realizan para ver si la infecci?n se ha propagado o ha empeorado. ??C?mo se trata? ?El tratamiento de esta afecci?n depende  de la naturaleza de la infecci?n espec?fica. ?El tratamiento puede incluir antibi?ticos por v?a oral que destruyen m?s tipos de bacterias (de amplio espectro). ?Las infecciones graves y resistentes  a los antibi?ticos pueden requerir [employer]. En los casos graves, esto puede incluir lo siguiente: ?Neomia Dear cirug?a para remover los tejidos infectados o da?ados. ?Administraci?n de antibi?ticos u otros medicamentos a trav?s de una v?a intravenosa. ?Siga estas instrucciones en su casa: ?Toma de antibi?ticos de forma correcta ? ?Comprenda cu?ndo los antibi?ticos son necesarios y cu?ndo no. ?No solicite una receta para antibi?ticos si le han diagnosticado una enfermedad viral. Los antibi?ticos no har?n que la enfermedad desaparezca m?s r?pido. Las enfermedades virales frecuentes incluyen una infecci?n del o?do, una infecci?n de los senos paranasales, la gastroenteritis v?rica epid?mica, o la bronquitis. ?No tome los antibi?ticos que le hayan sobrado de una receta anterior. ?No tome ning?n antibi?tico que le hayan recetado a Engineer, maintenance (IT). ?Si le recetan un antibi?tico: ?T?melo exactamente como se lo haya indicado el m?dico. No deje de tomar el medicamento aunque comience a sentirse mejor. ?Si lo ha [employer] m?s de 10 d?as, consulte al m?dico o al farmac?utico si debe continuar tom?ndolo. ?No guarde los antibi?ticos que le sobren para tomarlos m?s adelante. Deseche los medicamentos que no haya tomado como se lo haya indicado el m?dico o el farmac?utico. ?Prevenci?n de infecciones ? ?  ? ?Si tiene una infecci?n, evite el contacto cercano con las personas que est?n cerca suyo. ?Evite usar elementos personales utilizados por otras personas, como toallas, rasuradoras o ropa de [place]. ?Desinfecte peri?dicamente los picaportes, las superficies de preparaci?n de alimentos y los ba?os con soluciones o productos que contengan lej?a. ?L?vese las manos frecuentemente con agua y jab?n durante al menos 20  segundos. En particular, l?vese las manos: ?Despu?s de usar el ba?o. ?Antes y despu?s de curar una herida o incisi?n. ?Antes y despu?s de preparar la comida. ?Mantenga su esquema de vacunaci?n al d?a. Muchas vacunas evitan las enfermedades que se tratan con antibi?ticos. ?Comun?quese con un m?dico si: ?Tiene fiebre o escalofr?os. ?Est? tomando un antibi?tico nuevo y no mejora despu?s de unos d?as. ?Tiene tres o m?s per?odos de diarrea despu?s de Corporate investment banker un antibi?tico nuevo. ?Tiene la piel m?s caliente o mayor enrojecimiento o dolor con la palpaci?n alrededor de una herida o incisi?n. ?Tiene una secreci?n de color marr?n, amarillo o verde en una herida o incisi?n. ?Siente mal olor proveniente de una herida o incisi?n. ?Presenta n?useas, v?mitos o dolor abdominal nuevos o estos empeoran. ?Solicite ayuda de inmediato si: ?Presenta una erupci?n cut?nea. ?Tiene picaz?n en la boca o la lengua. ?Tiene una sensaci?n de opresi?n en la garganta. ?Tiene dificultad para respirar. ?Siente dolor u opresi?n en el pecho. ?Se siente mareado o se desmaya. ?Estos s?ntomas pueden representar un problema grave que constituye Radio broadcast assistant. No espere a ver si los s?ntomas desaparecen. Solicite atenci?n m?dica de inmediato. Comun?quese con el servicio de emergencias de su localidad (911 en los [place]). No conduzca por sus propios medios [employer]. ?Resumen ?Los antibi?ticos son medicamentos utilizados para tratar infecciones causadas por bacterias. ?La resistencia a los antibi?ticos significa que el medicamento una vez usado para tratar una infecci?n ya no es Facilities manager las bacterias. Estas bacterias pueden proliferar y causar infecciones resistentes a diversos tipos de antibi?ticos. Si esto ocurre, las bacterias resistentes pueden transmitir una infecci?n que es dif?cil de Warehouse manager. ?Comprenda cu?ndo los antibi?ticos son necesarios y cu?ndo no. ?Si le recetan un antibi?tico, t?melo exactamente como se lo haya indicado  el m?dico. No deje de tomar el medicamento aunque comience a sentirse mejor. ?Busque ayuda de inmediato si presenta una erupci?n cut?nea. ?Esta informaci?n  no tiene Theme park manager el consejo del m?dic

## 2021-06-26 NOTE — Progress Notes (Signed)
Patient reports having a cough and sore throat for the past week. Patient started taking amoxicillin -her children's prescription- patient reports symptoms are now better. ? ? ?
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
## 2021-10-01 ENCOUNTER — Encounter: Payer: Self-pay | Admitting: Nurse Practitioner

## 2021-10-01 ENCOUNTER — Ambulatory Visit: Payer: Medicaid Other | Attending: Nurse Practitioner

## 2021-10-01 ENCOUNTER — Ambulatory Visit: Payer: Medicaid Other | Attending: Nurse Practitioner | Admitting: Nurse Practitioner

## 2021-10-01 ENCOUNTER — Other Ambulatory Visit (HOSPITAL_COMMUNITY)
Admission: RE | Admit: 2021-10-01 | Discharge: 2021-10-01 | Disposition: A | Discharge status: Home / Self Care | Payer: Medicaid Other | Source: Ambulatory Visit | Attending: Nurse Practitioner | Admitting: Nurse Practitioner

## 2021-10-01 ENCOUNTER — Other Ambulatory Visit: Payer: Self-pay | Admitting: Nurse Practitioner

## 2021-10-01 VITALS — BP 109/73 | HR 70 | Temp 98.3°F | Resp 16 | Ht <= 58 in | Wt 128.0 lb

## 2021-10-01 DIAGNOSIS — Z0000 Encounter for general adult medical examination without abnormal findings: Secondary | ICD-10-CM | POA: Diagnosis not present

## 2021-10-01 DIAGNOSIS — F419 Anxiety disorder, unspecified: Secondary | ICD-10-CM

## 2021-10-01 DIAGNOSIS — B3731 Acute candidiasis of vulva and vagina: Secondary | ICD-10-CM | POA: Insufficient documentation

## 2021-10-01 DIAGNOSIS — F32A Depression, unspecified: Secondary | ICD-10-CM

## 2021-10-01 DIAGNOSIS — R7989 Other specified abnormal findings of blood chemistry: Secondary | ICD-10-CM

## 2021-10-01 DIAGNOSIS — K219 Gastro-esophageal reflux disease without esophagitis: Secondary | ICD-10-CM | POA: Diagnosis not present

## 2021-10-01 DIAGNOSIS — E785 Hyperlipidemia, unspecified: Secondary | ICD-10-CM

## 2021-10-01 DIAGNOSIS — Z862 Personal history of diseases of the blood and blood-forming organs and certain disorders involving the immune mechanism: Secondary | ICD-10-CM

## 2021-10-01 MED ORDER — ESCITALOPRAM OXALATE 10 MG PO TABS: 10.0000 mg | ORAL_TABLET | Freq: Every day | ORAL | 1 refills | Status: DC

## 2021-10-01 MED ORDER — FLUCONAZOLE 150 MG PO TABS: 150.0000 mg | ORAL_TABLET | Freq: Once | ORAL | 0 refills | Status: AC

## 2021-10-01 MED ORDER — FAMOTIDINE 20 MG PO TABS: 20.0000 mg | ORAL_TABLET | Freq: Every day | ORAL | 2 refills | Status: DC

## 2021-10-01 NOTE — Progress Notes (Signed)
Physical   [name] Hale [id number]
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
## 2021-10-02 LAB — CBC
Hematocrit: 32.3 % — ABNORMAL LOW (ref 34.0–46.6)
Hemoglobin: 10.3 g/dL — ABNORMAL LOW (ref 11.1–15.9)
MCH: 24.3 pg — ABNORMAL LOW (ref 26.6–33.0)
MCHC: 31.9 g/dL (ref 31.5–35.7)
MCV: 76 fL — ABNORMAL LOW (ref 79–97)
Platelets: 373 10*3/uL (ref 150–450)
RBC: 4.23 x10E6/uL (ref 3.77–5.28)
RDW: 16.9 % — ABNORMAL HIGH (ref 11.7–15.4)
WBC: 8.9 10*3/uL (ref 3.4–10.8)

## 2021-10-02 LAB — CMP14+EGFR
ALT: 19 IU/L (ref 0–32)
AST: 16 IU/L (ref 0–40)
Albumin/Globulin Ratio: 1.5 (ref 1.2–2.2)
Albumin: 4.4 g/dL (ref 3.9–4.9)
Alkaline Phosphatase: 69 IU/L (ref 44–121)
BUN/Creatinine Ratio: 13 (ref 9–23)
BUN: 9 mg/dL (ref 6–20)
Bilirubin Total: <0.2 mg/dL (ref 0.0–1.2)
CO2: 20 mmol/L (ref 20–29)
Calcium: 9.6 mg/dL (ref 8.7–10.2)
Chloride: 102 mmol/L (ref 96–106)
Creatinine, Ser: 0.69 mg/dL (ref 0.57–1.00)
Globulin, Total: 2.9 g/dL (ref 1.5–4.5)
Glucose: 124 mg/dL — ABNORMAL HIGH (ref 70–99)
Potassium: 4.6 mmol/L (ref 3.5–5.2)
Sodium: 137 mmol/L (ref 134–144)
Total Protein: 7.3 g/dL (ref 6.0–8.5)
eGFR: 113 mL/min/{1.73_m2} (ref >59)

## 2021-10-02 LAB — LIPID PANEL
Chol/HDL Ratio: 4.1 ratio (ref 0.0–4.4)
Cholesterol, Total: 197 mg/dL (ref 100–199)
HDL: 48 mg/dL (ref >39)
LDL Chol Calc (NIH): 139 mg/dL — ABNORMAL HIGH (ref 0–99)
Triglycerides: 56 mg/dL (ref 0–149)
VLDL Cholesterol Cal: 10 mg/dL (ref 5–40)

## 2021-10-02 LAB — CERVICOVAGINAL ANCILLARY ONLY
Bacterial Vaginitis (gardnerella): NEGATIVE
Candida Glabrata: NEGATIVE
Candida Vaginitis: NEGATIVE
Chlamydia: NEGATIVE
Comment: NEGATIVE
Comment: NEGATIVE
Comment: NEGATIVE
Comment: NEGATIVE
Comment: NEGATIVE
Comment: NORMAL
Neisseria Gonorrhea: NEGATIVE
Trichomonas: NEGATIVE

## 2021-11-25 ENCOUNTER — Emergency Department (HOSPITAL_COMMUNITY)
Admission: EM | Admit: 2021-11-25 | Discharge: 2021-11-25 | Disposition: A | Discharge status: Home / Self Care | Payer: Medicaid Other | Attending: Emergency Medicine | Admitting: Emergency Medicine

## 2021-11-25 ENCOUNTER — Other Ambulatory Visit: Payer: Self-pay

## 2021-11-25 DIAGNOSIS — B349 Viral infection, unspecified: Secondary | ICD-10-CM | POA: Diagnosis not present

## 2021-11-25 DIAGNOSIS — H66001 Acute suppurative otitis media without spontaneous rupture of ear drum, right ear: Secondary | ICD-10-CM | POA: Diagnosis not present

## 2021-11-25 DIAGNOSIS — Z20822 Contact with and (suspected) exposure to covid-19: Secondary | ICD-10-CM | POA: Insufficient documentation

## 2021-11-25 DIAGNOSIS — J029 Acute pharyngitis, unspecified: Secondary | ICD-10-CM | POA: Diagnosis present

## 2021-11-25 LAB — GROUP A STREP BY PCR: Group A Strep by PCR: NOT DETECTED

## 2021-11-25 LAB — SARS CORONAVIRUS 2 BY RT PCR: SARS Coronavirus 2 by RT PCR: NEGATIVE

## 2021-11-25 MED ORDER — AMOXICILLIN-POT CLAVULANATE 875-125 MG PO TABS
1.0000 | ORAL_TABLET | Freq: Once | ORAL | Status: AC
  Administered 2021-11-25: 1 via ORAL
  Filled 2021-11-25: qty 1

## 2021-11-25 MED ORDER — IBUPROFEN 800 MG PO TABS
800.0000 mg | ORAL_TABLET | Freq: Once | ORAL | Status: AC
  Administered 2021-11-25: 800 mg via ORAL
  Filled 2021-11-25: qty 1

## 2021-11-25 MED ORDER — AMOXICILLIN-POT CLAVULANATE 875-125 MG PO TABS: 1.0000 | ORAL_TABLET | Freq: Two times a day (BID) | ORAL | 0 refills | Status: DC

## 2021-11-25 NOTE — ED Triage Notes (Signed)
Ambulatory to ED with c/o sinus pain then started to have throat pain x 8 days. Denies any sick exposures.
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
## 2021-11-25 NOTE — Discharge Instructions (Signed)
You are seen here today for your symptoms of body ache, headache, ear pain and throat pain.  You likely have a virus causing the majority of her symptoms, however you do also have an ear infection for which you have been prescribed antibiotics.  You should take these as prescribed for the entire course.  Continue use over-the-counter throat lozenges, throat spray, tea with honey for your symptoms.  May use Tylenol and ibuprofen at home as well.  Return to the ER with any severe symptoms.

## 2021-11-26 ENCOUNTER — Ambulatory Visit: Payer: Self-pay | Admitting: *Deleted

## 2021-11-26 NOTE — Telephone Encounter (Signed)
  Chief Complaint: Sore throat Symptoms: Temp 100.1, severe sore throat, ear ache, headache. Seen in ED yesterday , neg strep neg covid. States given ATBs. Started today. States hard to swallow, due to pain. Urine a darker yellow.Advised in ED to contact PCP if throat continues with level of severe pain.  Frequency: 1 week ago, ED yesterday Pertinent Negatives: Patient denies  Disposition: [] ED /[] Urgent Care (no appt availability in office) / [] Appointment(In office/virtual)/ []  [facility]/ [] Home Care/ [] Refused Recommended Disposition /[] [facility]/ [x]  Follow-up with PCP Additional Notes: Please advise pt, requesting something for pain of sore throat. No availability at practice. Declines UC. DId give home care advise, verbalized understanding.  Assisted by Interpreter Henreitta Cea # [phone] Reason for Disposition  SEVERE (e.g., excruciating) throat pain  Answer Assessment - Initial Assessment Questions 1. ONSET: "When did the throat start hurting?" (Hours or days ago)      Over a week, seen in ED yesterday 2. SEVERITY: "How bad is the sore throat?" (Scale 1-10; mild, moderate or severe)   - MILD (1-3):  Doesn't interfere with eating or normal activities.   - MODERATE (4-7): Interferes with eating some solids and normal activities.   - SEVERE (8-10):  Excruciating pain, interferes with most normal activities.   - SEVERE WITH DYSPHAGIA (10): Can't swallow liquids, drooling.     10/10 3. STREP EXPOSURE: "Has there been any exposure to strep within the past week?" If Yes, ask: "What type of contact occurred?"      Neg Strep 4.  VIRAL SYMPTOMS: "Are there any symptoms of a cold, such as a runny nose, cough, hoarse voice or red eyes?"      Earache, headache 5. FEVER: "Do you have a fever?" If Yes, ask: "What is your temperature, how was it measured, and when did it start?"     100.1 6. PUS ON THE TONSILS: "Is there pus on the tonsils in the back of your throat?"      7.  OTHER SYMPTOMS: "Do you have any other symptoms?" (e.g., difficulty breathing, headache, rash)     Difficulty swallowing, cannot drink water, swelling.  Urine darker  Protocols used: Sore Throat-A-AH

## 2021-11-26 NOTE — Telephone Encounter (Signed)
She can put the pills in applesauce, oatmeal, pudding etc if having a hard time swallowing them. She can also take liquid motrin over the counter for pain.

## 2021-11-27 NOTE — Telephone Encounter (Signed)
Noted. She needs to be seen urgently if she can not keep anything down.

## 2021-11-27 NOTE — Telephone Encounter (Signed)
Response relayed. Patient states she is now experiencing vomiting and still refuses to go into the ED or UC.

## 2022-03-25 ENCOUNTER — Emergency Department (HOSPITAL_COMMUNITY)
Admission: EM | Admit: 2022-03-25 | Discharge: 2022-03-25 | Disposition: A | Discharge status: Home / Self Care | Payer: Medicaid Other | Attending: Emergency Medicine | Admitting: Emergency Medicine

## 2022-03-25 ENCOUNTER — Emergency Department (HOSPITAL_COMMUNITY): Payer: Medicaid Other

## 2022-03-25 ENCOUNTER — Encounter (HOSPITAL_COMMUNITY): Payer: Self-pay

## 2022-03-25 ENCOUNTER — Other Ambulatory Visit: Payer: Self-pay

## 2022-03-25 DIAGNOSIS — X58XXXA Exposure to other specified factors, initial encounter: Secondary | ICD-10-CM | POA: Insufficient documentation

## 2022-03-25 DIAGNOSIS — S61210A Laceration without foreign body of right index finger without damage to nail, initial encounter: Secondary | ICD-10-CM | POA: Diagnosis not present

## 2022-03-25 DIAGNOSIS — Y93E5 Activity, floor mopping and cleaning: Secondary | ICD-10-CM | POA: Diagnosis not present

## 2022-03-25 DIAGNOSIS — S6991XA Unspecified injury of right wrist, hand and finger(s), initial encounter: Secondary | ICD-10-CM | POA: Diagnosis present

## 2022-03-25 DIAGNOSIS — Z23 Encounter for immunization: Secondary | ICD-10-CM | POA: Diagnosis not present

## 2022-03-25 MED ORDER — TETANUS-DIPHTH-ACELL PERTUSSIS 5-2.5-18.5 LF-MCG/0.5 IM SUSY
0.5000 mL | PREFILLED_SYRINGE | Freq: Once | INTRAMUSCULAR | Status: AC
  Administered 2022-03-25: 0.5 mL via INTRAMUSCULAR
  Filled 2022-03-25: qty 0.5

## 2022-03-25 MED ORDER — ACETAMINOPHEN 325 MG PO TABS
650.0000 mg | ORAL_TABLET | Freq: Once | ORAL | Status: AC
  Administered 2022-03-25: 650 mg via ORAL
  Filled 2022-03-25: qty 2

## 2022-03-25 NOTE — ED Triage Notes (Signed)
Patient cut her right pointer finger on a mop. Unknown if tetanus is up to date.

## 2022-03-25 NOTE — ED Provider Notes (Signed)
[facility] DEPT Provider Note   CSN: [id number] Arrival date & time: 03/25/22  1404     History  Chief Complaint  Patient presents with   Extremity Laceration    [name] Hale is a 42 y.o. female, no pertinent past medical history, who presents to the ED secondary to cutting her finger on a mop while she was mopping today.  Tetanus unknown.  She denies any paresthesias, excessive bleeding to the hand.  No use of blood thinners.  Has not taken anything for the pain.    Home Medications Prior to Admission medications   Medication Sig Start Date End Date Taking? Authorizing Provider  amoxicillin-clavulanate (AUGMENTIN) 875-125 MG tablet Take 1 tablet by mouth every 12 (twelve) hours. 11/25/21   Sponseller, Eugene Garnet R, PA-C  cyclobenzaprine (FLEXERIL) 5 MG tablet Take 1 tablet (5 mg total) by mouth at bedtime. NECK PAIN Patient not taking: Reported on 06/26/2021 04/02/21   Gildardo Pounds, NP  escitalopram (LEXAPRO) 10 MG tablet Take 1 tablet (10 mg total) by mouth daily. FOR DEPRESSION 10/01/21   Gildardo Pounds, NP  famotidine (PEPCID) 20 MG tablet Take 1 tablet (20 mg total) by mouth daily. For heartburn 10/01/21   Gildardo Pounds, NP  ferrous sulfate 325 (65 FE) MG tablet Take 1 tablet (325 mg total) by mouth daily with breakfast. 04/02/21 07/01/21  Gildardo Pounds, NP      Allergies    Ciprofloxacin, Estrogens, Metronidazole, and Pork-derived products    Review of Systems   Review of Systems  Skin:  Positive for wound.  Neurological:  Negative for numbness.    Physical Exam Updated Vital Signs BP 109/74 (BP Location: Right Arm)   Pulse 74   Temp 98 F (36.7 C) (Oral)   Resp 19   SpO2 100%  Physical Exam Vitals and nursing note reviewed.  Constitutional:      General: She is not in acute distress.    Appearance: She is well-developed.  HENT:     Head: Normocephalic and atraumatic.  Eyes:     General:        Right eye: No  discharge.        Left eye: No discharge.     Conjunctiva/sclera: Conjunctivae normal.  Cardiovascular:     Pulses: Normal pulses.  Pulmonary:     Effort: No respiratory distress.  Musculoskeletal:     Comments: Left/Right hand: TTP of 2nd intermediate phalanx. Radial pulses present. Grip strength intact. Able to flex, extend, ulnar and radial deviate wrist. Two point discrimination intact. Normal thumb opposition. Intact ROM for all MCPs, PIPs, and DIPs.  No snuffbox ttp. No sensory deficits. Capillary refill <2sec   Skin:    General: Skin is warm.     Capillary Refill: Capillary refill takes less than 2 seconds.     Comments: +0.5 cm x0.5cm laceration to palmar aspect of 2nd digit  Neurological:     Mental Status: She is alert.     Comments: Clear speech.   Psychiatric:        Behavior: Behavior normal.        Thought Content: Thought content normal.     ED Results / Procedures / Treatments   Labs (all labs ordered are listed, but only abnormal results are displayed) Labs Reviewed - No data to display  EKG None  Radiology DG Finger Index Right  Result Date: 03/25/2022 CLINICAL DATA:  laceration EXAM: RIGHT INDEX FINGER 2+V COMPARISON:  None Available.  FINDINGS: There is no evidence of fracture or dislocation. There is no evidence of arthropathy or other focal bone abnormality. No radiodense foreign body or subcutaneous gas. IMPRESSION: Negative. Electronically Signed   By: Lucrezia Europe M.D.   On: 03/25/2022 14:56    Procedures .Marland KitchenLaceration Repair  Date/Time: 03/25/2022 3:13 PM  Performed by: Osvaldo Shipper, PA Authorized by: Osvaldo Shipper, PA   Consent:    Consent obtained:  Verbal   Consent given by:  Patient   Risks discussed:  Infection, pain, need for additional repair, poor cosmetic result and poor wound healing   Alternatives discussed:  No treatment Universal protocol:    Patient identity confirmed:  Arm band Anesthesia:    Anesthesia method:   None Laceration details:    Location:  Finger   Finger location:  R index finger   Length (cm):  0.5 (0.5x0.5cm) Exploration:    Hemostasis achieved with:  Direct pressure Treatment:    Area cleansed with:  Chlorhexidine   Amount of cleaning:  Standard   Irrigation solution:  Sterile saline   Irrigation method:  Syringe Skin repair:    Repair method:  Steri-Strips   Number of Steri-Strips:  2 Approximation:    Approximation:  Close Repair type:    Repair type:  Simple Post-procedure details:    Dressing:  Adhesive bandage   Procedure completion:  Tolerated     Medications Ordered in ED Medications  Tdap (BOOSTRIX) injection 0.5 mL (0.5 mLs Intramuscular Given 03/25/22 1437)  acetaminophen (TYLENOL) tablet 650 mg (650 mg Oral Given 03/25/22 1437)    ED Course/ Medical Decision Making/ A&P                             Medical Decision Making Patient here for laceration to right index finger, will obtain x-ray secondary to pain update tetanus given mental component, and give Tylenol for pain control.-  Amount and/or Complexity of Data Reviewed Radiology: ordered.    Details: No acute findings Discussion of management or test interpretation with external provider(s): Cleanse wound, repaired with Steri-Strips with good hemostasis, return precautions emphasized.  Risk OTC drugs. Prescription drug management.   Final Clinical Impression(s) / ED Diagnoses Final diagnoses:  Laceration of right index finger without foreign body without damage to nail, initial encounter    Rx / DC Orders ED Discharge Orders     None         Osvaldo Shipper, PA 03/25/22 1515    Milton Ferguson, MD 03/27/22 [phone]

## 2022-03-25 NOTE — Discharge Instructions (Signed)
Please follow-up with PCP. Keep wound clean at all times. If wound becomes red, swollen or more painful please return to ED.

## 2022-04-08 ENCOUNTER — Ambulatory Visit: Payer: Medicaid Other | Attending: Nurse Practitioner | Admitting: Nurse Practitioner

## 2022-04-08 ENCOUNTER — Telehealth: Payer: Self-pay | Admitting: Licensed Clinical Social Worker

## 2022-04-08 ENCOUNTER — Encounter: Payer: Self-pay | Admitting: Nurse Practitioner

## 2022-04-08 VITALS — BP 107/68 | HR 70 | Ht <= 58 in | Wt 129.0 lb

## 2022-04-08 DIAGNOSIS — F32A Depression, unspecified: Secondary | ICD-10-CM

## 2022-04-08 DIAGNOSIS — D649 Anemia, unspecified: Secondary | ICD-10-CM | POA: Diagnosis not present

## 2022-04-08 DIAGNOSIS — R109 Unspecified abdominal pain: Secondary | ICD-10-CM

## 2022-04-08 DIAGNOSIS — T1491XA Suicide attempt, initial encounter: Secondary | ICD-10-CM

## 2022-04-08 DIAGNOSIS — F419 Anxiety disorder, unspecified: Secondary | ICD-10-CM | POA: Diagnosis not present

## 2022-04-08 LAB — POCT URINALYSIS DIP (CLINITEK)
Bilirubin, UA: NEGATIVE
Blood, UA: NEGATIVE
Glucose, UA: NEGATIVE mg/dL
Ketones, POC UA: NEGATIVE mg/dL
Leukocytes, UA: NEGATIVE
Nitrite, UA: NEGATIVE
POC PROTEIN,UA: NEGATIVE
Spec Grav, UA: 1.02 (ref 1.010–1.025)
Urobilinogen, UA: 0.2 E.U./dL
pH, UA: 6 (ref 5.0–8.0)

## 2022-04-08 MED ORDER — FERROUS SULFATE 325 (65 FE) MG PO TABS: 325.0000 mg | ORAL_TABLET | Freq: Every day | ORAL | 3 refills | Status: DC

## 2022-04-08 MED ORDER — ESCITALOPRAM OXALATE 20 MG PO TABS: 20.0000 mg | ORAL_TABLET | Freq: Every day | ORAL | 1 refills | Status: DC
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
## 2022-04-08 NOTE — Telephone Encounter (Signed)
LCSWA met patient today to introduce herself and to assess patients' mental health needs. Patient was referred by PCP for depression.  LCSWA provided client with some coping skills to manager her depression as well as a list of [place] speaking counselors.

## 2022-04-09 LAB — CBC WITH DIFFERENTIAL/PLATELET
Basophils Absolute: 0.1 10*3/uL (ref 0.0–0.2)
Basos: 1 %
EOS (ABSOLUTE): 0.1 10*3/uL (ref 0.0–0.4)
Eos: 1 %
Hematocrit: 36.7 % (ref 34.0–46.6)
Hemoglobin: 12.2 g/dL (ref 11.1–15.9)
Immature Grans (Abs): 0 10*3/uL (ref 0.0–0.1)
Immature Granulocytes: 0 %
Lymphocytes Absolute: 2.9 10*3/uL (ref 0.7–3.1)
Lymphs: 32 %
MCH: 27.9 pg (ref 26.6–33.0)
MCHC: 33.2 g/dL (ref 31.5–35.7)
MCV: 84 fL (ref 79–97)
Monocytes Absolute: 0.5 10*3/uL (ref 0.1–0.9)
Monocytes: 5 %
Neutrophils Absolute: 5.3 10*3/uL (ref 1.4–7.0)
Neutrophils: 61 %
Platelets: 344 10*3/uL (ref 150–450)
RBC: 4.37 x10E6/uL (ref 3.77–5.28)
RDW: 14.2 % (ref 11.7–15.4)
WBC: 8.8 10*3/uL (ref 3.4–10.8)

## 2022-04-09 LAB — CMP14+EGFR
ALT: 17 IU/L (ref 0–32)
AST: 13 IU/L (ref 0–40)
Albumin/Globulin Ratio: 1.8 (ref 1.2–2.2)
Albumin: 4.8 g/dL (ref 3.9–4.9)
Alkaline Phosphatase: 72 IU/L (ref 44–121)
BUN/Creatinine Ratio: 11 (ref 9–23)
BUN: 7 mg/dL (ref 6–20)
Bilirubin Total: 0.2 mg/dL (ref 0.0–1.2)
CO2: 20 mmol/L (ref 20–29)
Calcium: 9.6 mg/dL (ref 8.7–10.2)
Chloride: 104 mmol/L (ref 96–106)
Creatinine, Ser: 0.61 mg/dL (ref 0.57–1.00)
Globulin, Total: 2.6 g/dL (ref 1.5–4.5)
Glucose: 84 mg/dL (ref 70–99)
Potassium: 4.3 mmol/L (ref 3.5–5.2)
Sodium: 140 mmol/L (ref 134–144)
Total Protein: 7.4 g/dL (ref 6.0–8.5)
eGFR: 117 mL/min/{1.73_m2} (ref >59)

## 2022-04-30 ENCOUNTER — Encounter: Payer: Self-pay | Admitting: Nurse Practitioner

## 2022-04-30 ENCOUNTER — Telehealth (HOSPITAL_BASED_OUTPATIENT_CLINIC_OR_DEPARTMENT_OTHER): Payer: Medicaid Other | Admitting: Nurse Practitioner

## 2022-04-30 DIAGNOSIS — F419 Anxiety disorder, unspecified: Secondary | ICD-10-CM | POA: Diagnosis not present

## 2022-04-30 DIAGNOSIS — F32A Depression, unspecified: Secondary | ICD-10-CM | POA: Diagnosis not present
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
## 2023-01-08 ENCOUNTER — Telehealth (INDEPENDENT_AMBULATORY_CARE_PROVIDER_SITE_OTHER): Payer: Self-pay

## 2023-01-08 NOTE — Telephone Encounter (Signed)
Patient called and said medicaid will not cover the medication that dr Suszanne Conners prescribed her in July 2023 for heart burn. I told her she can get her pcp to prescribe it or she can get it over the counter. The other name for it was PEPCID. On the back will have the famotidine. She said she will go and buy this.

## 2023-03-31 ENCOUNTER — Encounter (HOSPITAL_COMMUNITY): Payer: Self-pay | Admitting: Emergency Medicine

## 2023-03-31 ENCOUNTER — Other Ambulatory Visit: Payer: Self-pay

## 2023-03-31 ENCOUNTER — Emergency Department (HOSPITAL_COMMUNITY)
Admission: EM | Admit: 2023-03-31 | Discharge: 2023-03-31 | Disposition: A | Discharge status: Home / Self Care | Payer: Medicaid Other | Attending: Emergency Medicine | Admitting: Emergency Medicine

## 2023-03-31 ENCOUNTER — Emergency Department (HOSPITAL_COMMUNITY): Payer: Medicaid Other

## 2023-03-31 DIAGNOSIS — R519 Headache, unspecified: Secondary | ICD-10-CM | POA: Diagnosis present

## 2023-03-31 LAB — BASIC METABOLIC PANEL
Anion gap: 8 (ref 5–15)
BUN: 9 mg/dL (ref 6–20)
CO2: 25 mmol/L (ref 22–32)
Calcium: 8.9 mg/dL (ref 8.9–10.3)
Chloride: 106 mmol/L (ref 98–111)
Creatinine, Ser: 0.47 mg/dL (ref 0.44–1.00)
GFR, Estimated: >60 mL/min (ref >60)
Glucose, Bld: 92 mg/dL (ref 70–99)
Potassium: 3.8 mmol/L (ref 3.5–5.1)
Sodium: 139 mmol/L (ref 135–145)

## 2023-03-31 LAB — CBC WITH DIFFERENTIAL/PLATELET
Abs Immature Granulocytes: 0.03 10*3/uL (ref 0.00–0.07)
Basophils Absolute: 0.1 10*3/uL (ref 0.0–0.1)
Basophils Relative: 1 %
Eosinophils Absolute: 0.1 10*3/uL (ref 0.0–0.5)
Eosinophils Relative: 1 %
HCT: 36.8 % (ref 36.0–46.0)
Hemoglobin: 12.5 g/dL (ref 12.0–15.0)
Immature Granulocytes: 0 %
Lymphocytes Relative: 26 %
Lymphs Abs: 2.7 10*3/uL (ref 0.7–4.0)
MCH: 30.3 pg (ref 26.0–34.0)
MCHC: 34 g/dL (ref 30.0–36.0)
MCV: 89.3 fL (ref 80.0–100.0)
Monocytes Absolute: 0.7 10*3/uL (ref 0.1–1.0)
Monocytes Relative: 6 %
Neutro Abs: 7.1 10*3/uL (ref 1.7–7.7)
Neutrophils Relative %: 66 %
Platelets: 317 10*3/uL (ref 150–400)
RBC: 4.12 MIL/uL (ref 3.87–5.11)
RDW: 13 % (ref 11.5–15.5)
WBC: 10.6 10*3/uL — ABNORMAL HIGH (ref 4.0–10.5)
nRBC: 0 % (ref 0.0–0.2)

## 2023-03-31 LAB — TSH: TSH: 1.718 u[IU]/mL (ref 0.350–4.500)

## 2023-03-31 LAB — HCG, SERUM, QUALITATIVE: Preg, Serum: NEGATIVE

## 2023-03-31 LAB — T4, FREE: Free T4: 0.82 ng/dL (ref 0.61–1.12)

## 2023-03-31 MED ORDER — NAPROXEN 500 MG PO TABS: 500.0000 mg | ORAL_TABLET | Freq: Two times a day (BID) | ORAL | 0 refills | Status: DC

## 2023-03-31 MED ORDER — PROCHLORPERAZINE EDISYLATE 10 MG/2ML IJ SOLN
10.0000 mg | Freq: Once | INTRAMUSCULAR | Status: AC
  Administered 2023-03-31: 10 mg via INTRAVENOUS
  Filled 2023-03-31: qty 2

## 2023-03-31 MED ORDER — DEXAMETHASONE SODIUM PHOSPHATE 10 MG/ML IJ SOLN
10.0000 mg | Freq: Once | INTRAMUSCULAR | Status: AC
  Administered 2023-03-31: 10 mg via INTRAVENOUS
  Filled 2023-03-31: qty 1

## 2023-03-31 MED ORDER — DIPHENHYDRAMINE HCL 50 MG/ML IJ SOLN
12.5000 mg | Freq: Once | INTRAMUSCULAR | Status: AC
  Administered 2023-03-31: 12.5 mg via INTRAVENOUS
  Filled 2023-03-31: qty 1

## 2023-03-31 NOTE — Discharge Instructions (Addendum)
[place] para sus dolores de [place]. Haga un seguimiento con su proveedor de atencin primaria  Regrese si los sntomas son nuevos o Psychologist, prison and probation services

## 2023-03-31 NOTE — ED Provider Notes (Signed)
[place] EMERGENCY DEPARTMENT AT [facility] Provider Note   CSN: [id number] Arrival date & time: 03/31/23  1503     History  Chief Complaint  Patient presents with   Headache    [name] Hale is a 43 y.o. female here for evaluation of headache.  States she has had intermittent headache over the last 2 to 3 months.  When it occurs it is located diffusely across her head.  No recent injury or trauma.  No vision changes.  No numbness.  She feels generally weak.  She states she previously had thyroid problems and was given medication however is not been on this recently.  She is not sure if this is hyper- or hypothyroid.  She has noted her hair has been falling out over the last few months as well.  No eye pain, vision changes, neck pain, fever, nausea, vomiting, chest pain, shortness of breath, abdominal pain.  Denies chance of pregnancy.  Taking Tylenol at home typically helps however headache unimproved 4 days ago.  Has not taken any other additional medication.  No recent changes in medication.  No heavy vaginal bleeding.  No recent spinal procedures, LP.  Headache not worst with position changes.   HPI     Home Medications Prior to Admission medications   Medication Sig Start Date End Date Taking? Authorizing Provider  naproxen (NAPROSYN) 500 MG tablet Take 1 tablet (500 mg total) by mouth 2 (two) times daily. 03/31/23  Yes Paulanthony Gleaves A, PA-C  escitalopram (LEXAPRO) 20 MG tablet Take 1 tablet (20 mg total) by mouth daily. FOR DEPRESSION 04/08/22   Claiborne Rigg, NP  ferrous sulfate 325 (65 FE) MG tablet Take 1 tablet (325 mg total) by mouth daily with breakfast. 04/08/22   Claiborne Rigg, NP      Allergies    Ciprofloxacin, Estrogens, Metronidazole, and Pork-derived products    Review of Systems   Review of Systems  Constitutional:  Positive for fatigue. Negative for activity change, appetite change, chills, diaphoresis, fever and unexpected weight  change.  HENT: Negative.    Respiratory: Negative.    Cardiovascular: Negative.   Gastrointestinal: Negative.   Genitourinary: Negative.   Musculoskeletal: Negative.   Skin: Negative.   Neurological:  Positive for weakness and headaches. Negative for dizziness, tremors, seizures, syncope, facial asymmetry, speech difficulty, light-headedness and numbness.  All other systems reviewed and are negative.   Physical Exam Updated Vital Signs BP 120/82   Pulse 80   Temp 98.2 F (36.8 C)   Resp 18   Ht 4\' 9"  (1.448 m)   Wt 58.5 kg   SpO2 100%   BMI 27.91 kg/m  Physical Exam Physical Exam  Constitutional: Pt is oriented to person, place, and time. Pt appears well-developed and well-nourished. No distress.  HENT:  Head: Normocephalic and atraumatic.  Mouth/Throat: Oropharynx is clear and moist.  Eyes: Conjunctivae and EOM are normal. Pupils are equal, round, and reactive to light. No scleral icterus.  No horizontal, vertical or rotational nystagmus  Neck: Normal range of motion. Neck supple.  Full active and passive ROM without pain No midline or paraspinal tenderness No nuchal rigidity or meningeal signs  Cardiovascular: Normal rate, regular rhythm and intact distal pulses.   Pulmonary/Chest: Effort normal and breath sounds normal. No respiratory distress. Pt has no wheezes. No rales.  Abdominal: Soft. Bowel sounds are normal. There is no tenderness. There is no rebound and no guarding.  Musculoskeletal: Normal range of motion.  Lymphadenopathy:  No cervical adenopathy.  Neurological: Pt. is alert and oriented to person, place, and time. He has normal reflexes. No cranial nerve deficit.  Exhibits normal muscle tone. Coordination normal.  Mental Status:  Alert, oriented, thought content appropriate. Speech fluent without evidence of aphasia. Able to follow 2 step commands without difficulty.  Cranial Nerves:  II2-12 grossly intact Motor:  Equal strength bil Sensory: Pinprick  and light touch normal in all extremities.  Deep Tendon Reflexes: 2+ and symmetric  Cerebellar: normal finger-to-nose with bilateral upper extremities Gait: normal gait and balance CV: distal pulses palpable throughout   Skin: Skin is warm and dry. No rash noted. Pt is not diaphoretic.  Psychiatric: Pt has a normal mood and affect. Behavior is normal. Judgment and thought content normal.  Nursing note and vitals reviewed.  ED Results / Procedures / Treatments   Labs (all labs ordered are listed, but only abnormal results are displayed) Labs Reviewed  CBC WITH DIFFERENTIAL/PLATELET - Abnormal; Notable for the following components:      Result Value   WBC 10.6 (*)    All other components within normal limits  BASIC METABOLIC PANEL  TSH  HCG, SERUM, QUALITATIVE  T4, FREE    EKG None  Radiology CT HEAD WO CONTRAST ( ) Result Date: 03/31/2023 CLINICAL DATA:  Worsening headache for 2-3 months. EXAM: CT HEAD WITHOUT CONTRAST TECHNIQUE: Contiguous axial images were obtained from the base of the skull through the vertex without intravenous contrast. RADIATION DOSE REDUCTION: This exam was performed according to the departmental dose-optimization program which includes automated exposure control, adjustment of the mA and/or kV according to patient size and/or use of iterative reconstruction technique. COMPARISON:  None Available. FINDINGS: Brain: No evidence of intracranial hemorrhage, acute infarction, hydrocephalus, extra-axial collection, or mass lesion/mass effect. Vascular:  No hyperdense vessel or other acute findings. Skull: No evidence of fracture or other significant bone abnormality. Sinuses/Orbits:  No acute findings. Other: None. IMPRESSION: Negative noncontrast head CT. Electronically Signed   By: Danae Orleans M.D.   On: 03/31/2023 16:44    Procedures Procedures    Medications Ordered in ED Medications  prochlorperazine (COMPAZINE) injection 10 mg (10 mg Intravenous Given  03/31/23 1627)  diphenhydrAMINE (BENADRYL) injection 12.5 mg (12.5 mg Intravenous Given 03/31/23 1628)  dexamethasone (DECADRON) injection 10 mg (10 mg Intravenous Given 03/31/23 1747)    ED Course/ Medical Decision Making/ A&P   43 year old intermittent headache over the last 2 to 3 months.  Afebrile, nonseptic, not ill-appearing.  Typically resolves with Tylenol however Tylenol did not improve headache 4 days ago leading her here today.  No other additional medication tried.  No recent injury or trauma.  No sudden onset thunderclap headache.  No vision changes.  No numbness or weakness.  No fever, neck pain.  Has a nonfocal neuroexam without deficits.  She is requesting her thyroid checked that she does have a history of thyroid problems not currently on medication.  She is unsure if she was hyper or hypo-.  Has noted she has had generalized weakness as well as her hair falling out.  No recent medication changes.  Will plan on labs, imaging and reassess, migraine cocktail  Labs and imaging reviewed and interpreted: Labs without significant abnormality  Patient reassessed.  Headache improved after migraine cocktail.  Will have her follow-up with PCP, return for any worsening symptoms.  Pt HA treated and improved while in ED.  Presentation is like pts typical HA and non concerning for [facility], ICH, Meningitis, or  temporal arteritis. Pt is afebrile with no focal neuro deficits, nuchal rigidity, or change in vision. Pt is to follow up with PCP to discuss prophylactic medication. Pt verbalizes understanding and is agreeable with plan to dc.                                  Medical Decision Making Amount and/or Complexity of Data Reviewed Independent Historian: friend External Data Reviewed: notes. Labs: ordered. Decision-making details documented in ED Course. Radiology: ordered and independent interpretation performed. Decision-making details documented in ED Course.  Risk OTC drugs. Prescription  drug management. Parenteral controlled substances. Decision regarding hospitalization. Diagnosis or treatment significantly limited by social determinants of health.          Final Clinical Impression(s) / ED Diagnoses Final diagnoses:  Acute nonintractable headache, unspecified headache type    Rx / DC Orders ED Discharge Orders          Ordered    naproxen (NAPROSYN) 500 MG tablet  2 times daily        03/31/23 1813              Keaghan Bowens A, PA-C 03/31/23 1816    Wynetta Fines, MD 03/31/23 2239

## 2023-03-31 NOTE — ED Triage Notes (Signed)
Patient c/o headache x 2-3 months. Patient report increase hair loss too x 2 weeks.  Patient denies N/V.

## 2023-04-03 ENCOUNTER — Ambulatory Visit: Payer: Medicaid Other | Attending: Family Medicine | Admitting: Family Medicine

## 2023-04-03 ENCOUNTER — Encounter: Payer: Self-pay | Admitting: Family Medicine

## 2023-04-03 VITALS — BP 107/69 | HR 73 | Ht <= 58 in | Wt 141.6 lb

## 2023-04-03 DIAGNOSIS — M549 Dorsalgia, unspecified: Secondary | ICD-10-CM | POA: Diagnosis not present

## 2023-04-03 DIAGNOSIS — Z23 Encounter for immunization: Secondary | ICD-10-CM | POA: Diagnosis not present

## 2023-04-03 DIAGNOSIS — R519 Headache, unspecified: Secondary | ICD-10-CM | POA: Diagnosis not present

## 2023-04-03 DIAGNOSIS — D649 Anemia, unspecified: Secondary | ICD-10-CM | POA: Diagnosis not present

## 2023-04-03 MED ORDER — FERROUS SULFATE 325 (65 FE) MG PO TABS: 325.0000 mg | ORAL_TABLET | Freq: Every day | ORAL | 1 refills | Status: AC
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
## 2023-04-03 NOTE — Patient Instructions (Signed)
General Headache Without Cause A headache is pain or discomfort felt around the head or neck area. There are many causes and types of headaches. A few common types include: Tension headaches. Migraine headaches. Cluster headaches. Chronic daily headaches. Sometimes, the specific cause of a headache may not be found. Follow these instructions at home: Watch your condition for any changes. Let your health care provider know about them. Take these steps to help with your condition: Managing pain     Take over-the-counter and prescription medicines only as told by your health care provider. Treatment may include medicines for pain that are taken by mouth or applied to the skin. Lie down in a dark, quiet room when you have a headache. Keep lights dim if bright lights bother you or make your headaches worse. If directed, put ice on your head and neck area: Put ice in a plastic bag. Place a towel between your skin and the bag. Leave the ice on for 20 minutes, 2-3 times per day. Remove the ice if your skin turns bright red. This is very important. If you cannot feel pain, heat, or cold, you have a greater risk of damage to the area. If directed, apply heat to the affected area. Use the heat source that your health care provider recommends, such as a moist heat pack or a heating pad. Place a towel between your skin and the heat source. Leave the heat on for 20-30 minutes. Remove the heat if your skin turns bright red. This is especially important if you are unable to feel pain, heat, or cold. You have a greater risk of getting burned. Eating and drinking Eat meals on a regular schedule. If you drink alcohol: Limit how much you have to: 0-1 drink a day for women who are not pregnant. 0-2 drinks a day for men. Know how much alcohol is in a drink. In the [place] one drink equals one 12 oz bottle of beer (355 mL), one 5 oz glass of wine (148 mL), or one 1 oz glass of hard liquor (44 mL). Stop  drinking caffeine, or decrease the amount of caffeine you drink. Drink enough fluid to keep your urine pale yellow. General instructions  Keep a headache journal to help find out what may trigger your headaches. For example, write down: What you eat and drink. How much sleep you get. Any change to your diet or medicines. Try massage or other relaxation techniques. Limit stress. Sit up straight, and do not tense your muscles. Do not use any products that contain nicotine or tobacco. These products include cigarettes, chewing tobacco, and vaping devices, such as e-cigarettes. If you need help quitting, ask your health care provider. Exercise regularly as told by your health care provider. Sleep on a regular schedule. Get 7-9 hours of sleep each night, or the amount recommended by your health care provider. Keep all follow-up visits. This is important. Contact a health care provider if: Medicine does not help your symptoms. You have a headache that is different from your usual headache. You have nausea or you vomit. You have a fever. Get help right away if: Your headache: Becomes severe quickly. Gets worse after moderate to intense physical activity. You have any of these symptoms: Repeated vomiting. Pain or stiffness in your neck. Changes to your vision. Pain in an eye or ear. Problems with speech. Muscular weakness or loss of muscle control. Loss of balance or coordination. You feel faint or pass out. You have confusion. You have  a seizure. These symptoms may represent a serious problem that is an emergency. Do not wait to see if the symptoms will go away. Get medical help right away. Call your local emergency services (911 in the [place] Do not drive yourself to the hospital. Summary A headache is pain or discomfort felt around the head or neck area. There are many causes and types of headaches. In some cases, the cause may not be found. Keep a headache journal to help find out  what may trigger your headaches. Watch your condition for any changes. Let your health care provider know about them. Contact a health care provider if you have a headache that is different from the usual headache, or if your symptoms are not helped by medicine. Get help right away if your headache becomes severe, you vomit, you have a loss of vision, you lose your balance, or you have a seizure. This information is not intended to replace advice given to you by your health care provider. Make sure you discuss any questions you have with your health care provider. Document Revised: 07/26/2020 Document Reviewed: 07/26/2020 Elsevier Patient Education  2024 [employer].

## 2023-04-11 ENCOUNTER — Other Ambulatory Visit: Payer: Self-pay | Admitting: Nurse Practitioner

## 2023-04-11 DIAGNOSIS — F32A Depression, unspecified: Secondary | ICD-10-CM

## 2023-04-11 NOTE — Telephone Encounter (Signed)
Last Fill: 04/08/22  Last OV: 04/03/23 Next OV: 07/02/23  Routing to provider for review/authorization.   Copied from CRM [id number]. Topic: Clinical - Medication Refill >> Apr 11, 2023  3:58 PM Marlow Baars wrote: Most Recent Primary Care Visit:  Provider: Hoy Register  Department: [facility]  Visit Type: OFFICE VISIT  Date: 04/03/2023  Medication: escitalopram (LEXAPRO) 20 MG tablet  Has the patient contacted their pharmacy? Yes   Is this the correct pharmacy for this prescription? Yes If no, delete pharmacy and type the correct one.  This is the patient's preferred pharmacy:  [facility] [phone] - [place], [place] - [address] AT [facility] [address] [place] [address] Phone: [phone] Fax: [phone]   Has the prescription been filled recently? No  Is the patient out of the medication? Yes  Has the patient been seen for an appointment in the last year OR does the patient have an upcoming appointment? Yes  Can we respond through MyChart? Yes but would prefer a call or text  Please assist patient further

## 2023-04-15 ENCOUNTER — Other Ambulatory Visit: Payer: Self-pay | Admitting: Nurse Practitioner

## 2023-04-15 DIAGNOSIS — F32A Depression, unspecified: Secondary | ICD-10-CM

## 2023-04-15 MED ORDER — ESCITALOPRAM OXALATE 20 MG PO TABS: 20.0000 mg | ORAL_TABLET | Freq: Every day | ORAL | 1 refills | Status: DC

## 2023-04-15 NOTE — Telephone Encounter (Signed)
Last Fill: 04/08/22  Last OV: 04/03/23 Next OV: 07/02/23  Routing to provider for review/authorization.

## 2023-04-15 NOTE — Telephone Encounter (Signed)
 Copied from CRM [id number]. Topic: Clinical - Medication Refill >> Apr 15, 2023  2:01 PM Deleta HERO wrote: Most Recent Primary Care Visit:  Provider: DELBERT CLAM  Department: [facility]  Visit Type: OFFICE VISIT  Date: 04/03/2023  Medication: escitalopram  (LEXAPRO ) 20 MG tablet  Has the patient contacted their pharmacy? Yes, advised to re-send refill request. (Agent: If no, request that the patient contact the pharmacy for the refill. If patient does not wish to contact the pharmacy document the reason why and proceed with request.) (Agent: If yes, when and what did the pharmacy advise?)  Is this the correct pharmacy for this prescription? Yes If no, delete pharmacy and type the correct one.  This is the patient's preferred pharmacy:  [facility] [phone] - [place], [place] - [address] AT [facility] OF [address] [place] [address] Phone: [phone] Fax: [phone]   Has the prescription been filled recently? Yes  Is the patient out of the medication? Yes  Has the patient been seen for an appointment in the last year OR does the patient have an upcoming appointment? Yes  Can we respond through MyChart? No  Agent: Please be advised that Rx refills may take up to 3 business days. We ask that you follow-up with your pharmacy.

## 2023-06-13 ENCOUNTER — Other Ambulatory Visit: Payer: Self-pay

## 2023-06-13 ENCOUNTER — Emergency Department (HOSPITAL_COMMUNITY)

## 2023-06-13 ENCOUNTER — Emergency Department (HOSPITAL_COMMUNITY)
Admission: EM | Admit: 2023-06-13 | Discharge: 2023-06-13 | Disposition: A | Discharge status: Home / Self Care | Attending: Emergency Medicine | Admitting: Emergency Medicine

## 2023-06-13 DIAGNOSIS — R1032 Left lower quadrant pain: Secondary | ICD-10-CM | POA: Insufficient documentation

## 2023-06-13 DIAGNOSIS — R102 Pelvic and perineal pain: Secondary | ICD-10-CM | POA: Insufficient documentation

## 2023-06-13 DIAGNOSIS — M545 Low back pain, unspecified: Secondary | ICD-10-CM | POA: Insufficient documentation

## 2023-06-13 DIAGNOSIS — R1031 Right lower quadrant pain: Secondary | ICD-10-CM | POA: Diagnosis present

## 2023-06-13 DIAGNOSIS — N898 Other specified noninflammatory disorders of vagina: Secondary | ICD-10-CM | POA: Insufficient documentation

## 2023-06-13 DIAGNOSIS — M79671 Pain in right foot: Secondary | ICD-10-CM | POA: Diagnosis not present

## 2023-06-13 DIAGNOSIS — M79672 Pain in left foot: Secondary | ICD-10-CM | POA: Insufficient documentation

## 2023-06-13 LAB — PREGNANCY, URINE: Preg Test, Ur: NEGATIVE

## 2023-06-13 LAB — URINALYSIS, ROUTINE W REFLEX MICROSCOPIC
Bilirubin Urine: NEGATIVE
Glucose, UA: NEGATIVE mg/dL
Hgb urine dipstick: NEGATIVE
Ketones, ur: NEGATIVE mg/dL
Leukocytes,Ua: NEGATIVE
Nitrite: NEGATIVE
Protein, ur: NEGATIVE mg/dL
Specific Gravity, Urine: 1.004 — ABNORMAL LOW (ref 1.005–1.030)
pH: 6 (ref 5.0–8.0)

## 2023-06-13 LAB — WET PREP, GENITAL
Clue Cells Wet Prep HPF POC: NONE SEEN
Sperm: NONE SEEN
Trich, Wet Prep: NONE SEEN
WBC, Wet Prep HPF POC: <10 (ref <10)
Yeast Wet Prep HPF POC: NONE SEEN

## 2023-06-13 LAB — HIV ANTIBODY (ROUTINE TESTING W REFLEX): HIV Screen 4th Generation wRfx: NONREACTIVE

## 2023-06-13 LAB — CBG MONITORING, ED: Glucose-Capillary: 130 mg/dL — ABNORMAL HIGH (ref 70–99)

## 2023-06-13 MED ORDER — ACETAMINOPHEN 500 MG PO TABS
1000.0000 mg | ORAL_TABLET | Freq: Once | ORAL | Status: AC
  Administered 2023-06-13: 1000 mg via ORAL
  Filled 2023-06-13: qty 2

## 2023-06-13 MED ORDER — IBUPROFEN 200 MG PO TABS
600.0000 mg | ORAL_TABLET | Freq: Once | ORAL | Status: AC
  Administered 2023-06-13: 600 mg via ORAL
  Filled 2023-06-13: qty 3

## 2023-06-13 NOTE — ED Triage Notes (Signed)
 Pt arrived reporting abdominal pain, vaginal odor, white discharge and intermittent itching over the last 15 days. Denies any vaginal bleeding. States this started after her cycle ended about 15 days ago. Also reports ble foot pain, denies injury. Able to bear weight. No other symptoms

## 2023-06-13 NOTE — Discharge Instructions (Signed)
 You were seen in the emergency department for your pelvic pain and your foot pain.  Your ultrasound of your uterus and ovaries was normal you tested negative for yeast, trichomonas and bacterial vaginosis.  Your urine showed no signs of infection.  Unclear what is causing your pelvic pain you can continue to take Tylenol and Motrin as needed for pain and can follow-up with OB/GYN for further evaluation.  Your foot pain may be due to plantar fasciitis and I have given foot exercises to try.  Make sure that you are wearing shoes with good arch support.  You can have this reassessed by your primary doctor.  You should return to the emergency department for significantly worsening pain, fevers, repetitive vomiting or any other new or concerning symptoms.  Acudi a urgencias por dolor plvico y Engineer, mining de pies. La ecografa de tero y ovarios fue normal y dio negativo en las pruebas de [place], tricomonas y vaginosis bacteriana. La orina no mostr signos de infeccin. Si no se sabe con certeza qu le est causando el dolor plvico, puede seguir tomando Tylenol y Motrin segn sea necesario para el dolor y Science writer con un gineclogo para una evaluacin ms exhaustiva. El dolor de pies podra deberse a fascitis plantar; le he recomendado ejercicios para los pies. Asegrese de usar zapatos con buen soporte para el arco. Su mdico de cabecera puede volver a evaluarlo. Debe regresar a urgencias si presenta un empeoramiento significativo del dolor, fiebre, vmitos recurrentes o cualquier otro sntoma nuevo o preocupante.

## 2023-06-13 NOTE — ED Provider Notes (Signed)
 [place] EMERGENCY DEPARTMENT AT [facility] Provider Note   CSN: [id number] Arrival date & time: 06/13/23  7829     History  Chief Complaint  Patient presents with   Abdominal Pain   Foot Pain   Vaginal Itching    [name] Hale is a 43 y.o. female.  Patient is a 43 year old female with no significant past medical history presenting to the emergency department with vaginal discharge and bilateral foot pain.  Patient states that about 15 days ago she started to develop vaginal discharge.  She states that she initially thought that it was related to her menstrual cycle however she had a normal.  And the discharge has continued.  She states that she is also started to develop bilateral lower abdominal pain and some low back pain.  She denies any fevers, nausea, vomiting, dysuria or hematuria, diarrhea or constipation.  She states that she is not currently sexually active but is concerned about possible STD.  Patient also reports for the last several days she has had pain on the bottoms of both of her feet.  She states that it feels like a burning type of pain that is worse with walking.  She denies any trauma or falls or significant walking on a daily basis.  The history is provided by the patient. A language interpreter was used [employer] [phone]).  Abdominal Pain Foot Pain Associated symptoms include abdominal pain.  Vaginal Itching Associated symptoms include abdominal pain.       Home Medications Prior to Admission medications   Medication Sig Start Date End Date Taking? Authorizing Provider  escitalopram (LEXAPRO) 20 MG tablet Take 1 tablet (20 mg total) by mouth daily. FOR DEPRESSION 04/15/23   Claiborne Rigg, NP  ferrous sulfate 325 (65 FE) MG tablet Take 1 tablet (325 mg total) by mouth daily with breakfast. 04/03/23   Hoy Register, MD  naproxen (NAPROSYN) 500 MG tablet Take 1 tablet (500 mg total) by mouth 2 (two) times daily. 03/31/23    Henderly, Britni A, PA-C      Allergies    Ciprofloxacin, Estrogens, Metronidazole, and Pork-derived products    Review of Systems   Review of Systems  Gastrointestinal:  Positive for abdominal pain.    Physical Exam Updated Vital Signs BP 102/65 (BP Location: Right Arm)   Pulse 65   Temp 98.2 F (36.8 C) (Oral)   Resp 18   Ht 4\' 9"  (1.448 m)   Wt 64.2 kg   LMP 05/29/2023 (Approximate)   SpO2 100%   BMI 30.63 kg/m  Physical Exam Vitals and nursing note reviewed. Exam conducted with a chaperone present Reita Cliche RN).  Constitutional:      General: She is not in acute distress.    Appearance: She is well-developed.  HENT:     Head: Normocephalic and atraumatic.     Mouth/Throat:     Mouth: Mucous membranes are moist.  Eyes:     Extraocular Movements: Extraocular movements intact.  Cardiovascular:     Rate and Rhythm: Normal rate and regular rhythm.     Heart sounds: Normal heart sounds.  Pulmonary:     Effort: Pulmonary effort is normal.     Breath sounds: Normal breath sounds.  Abdominal:     General: Abdomen is flat.     Palpations: Abdomen is soft.     Tenderness: There is abdominal tenderness (Minimal suprapubic tenderness). There is no right CVA tenderness or left CVA tenderness.  Genitourinary:  Vagina: Vaginal discharge (scant, white) present.     Cervix: Normal. No cervical motion tenderness.     Uterus: Normal.      Adnexa:        Right: Tenderness present.        Left: Tenderness present.   Musculoskeletal:     Comments: No midline back tenderness Mild lumbar paraspinal muscle tenderness to palpation Diffuse tenderness to palpation of soles of bilateral feet, no skin changes  Skin:    General: Skin is warm and dry.  Neurological:     General: No focal deficit present.     Mental Status: She is alert and oriented to person, place, and time.  Psychiatric:        Mood and Affect: Mood normal.        Behavior: Behavior normal.     ED  Results / Procedures / Treatments   Labs (all labs ordered are listed, but only abnormal results are displayed) Labs Reviewed  URINALYSIS, ROUTINE W REFLEX MICROSCOPIC - Abnormal; Notable for the following components:      Result Value   Color, Urine STRAW (*)    Specific Gravity, Urine 1.004 (*)    All other components within normal limits  CBG MONITORING, ED - Abnormal; Notable for the following components:   Glucose-Capillary 130 (*)    All other components within normal limits  WET PREP, GENITAL  PREGNANCY, URINE  RPR  HIV ANTIBODY (ROUTINE TESTING W REFLEX)  GC/CHLAMYDIA PROBE AMP ([place]) NOT AT [facility]    EKG None  Radiology US Pelvis Complete Result Date: 06/13/2023 CLINICAL DATA:  Pelvic pain for 15 days EXAM: TRANSABDOMINAL AND TRANSVAGINAL ULTRASOUND OF PELVIS DOPPLER ULTRASOUND OF OVARIES TECHNIQUE: Both transabdominal and transvaginal ultrasound examinations of the pelvis were performed. Transabdominal technique was performed for global imaging of the pelvis including uterus, ovaries, adnexal regions, and pelvic cul-de-sac. It was necessary to proceed with endovaginal exam following the transabdominal exam to visualize the endometrium and adnexa. Color and duplex Doppler ultrasound was utilized to evaluate blood flow to the ovaries. COMPARISON:  Ultrasound pelvis 01/21/2011 CT abdomen pelvis 12/24/2018 FINDINGS: Uterus Measurements: 6.3 x 3.1 x 6.6 cm = volume: 109 mL. No fibroids or other mass visualized. Endometrium Thickness: 7 mm.  No focal abnormality visualized. Right ovary Measurements: 2.5 x 2.1 x 2.4 cm = volume: 6.4 mL. Normal appearance. Left ovary Measurements: 4.4 x 3.0 x 3.1 cm = volume: 21.3 mL. 2.3 cm dominant follicle is seen. Normal appearance. Pulsed Doppler evaluation of both ovaries demonstrates normal low-resistance arterial and venous waveforms. Other findings No abnormal free fluid. IMPRESSION: No significant sonographic abnormality of the uterus or  ovaries. Electronically Signed   By: Acquanetta Belling M.D.   On: 06/13/2023 11:57   US Transvaginal Non-OB Result Date: 06/13/2023 CLINICAL DATA:  Pelvic pain for 15 days EXAM: TRANSABDOMINAL AND TRANSVAGINAL ULTRASOUND OF PELVIS DOPPLER ULTRASOUND OF OVARIES TECHNIQUE: Both transabdominal and transvaginal ultrasound examinations of the pelvis were performed. Transabdominal technique was performed for global imaging of the pelvis including uterus, ovaries, adnexal regions, and pelvic cul-de-sac. It was necessary to proceed with endovaginal exam following the transabdominal exam to visualize the endometrium and adnexa. Color and duplex Doppler ultrasound was utilized to evaluate blood flow to the ovaries. COMPARISON:  Ultrasound pelvis 01/21/2011 CT abdomen pelvis 12/24/2018 FINDINGS: Uterus Measurements: 6.3 x 3.1 x 6.6 cm = volume: 109 mL. No fibroids or other mass visualized. Endometrium Thickness: 7 mm.  No focal abnormality visualized. Right ovary  Measurements: 2.5 x 2.1 x 2.4 cm = volume: 6.4 mL. Normal appearance. Left ovary Measurements: 4.4 x 3.0 x 3.1 cm = volume: 21.3 mL. 2.3 cm dominant follicle is seen. Normal appearance. Pulsed Doppler evaluation of both ovaries demonstrates normal low-resistance arterial and venous waveforms. Other findings No abnormal free fluid. IMPRESSION: No significant sonographic abnormality of the uterus or ovaries. Electronically Signed   By: Acquanetta Belling M.D.   On: 06/13/2023 11:57   [place] Art/Ven Flow Abd Pelv Doppler Result Date: 06/13/2023 CLINICAL DATA:  Pelvic pain for 15 days EXAM: TRANSABDOMINAL AND TRANSVAGINAL ULTRASOUND OF PELVIS DOPPLER ULTRASOUND OF OVARIES TECHNIQUE: Both transabdominal and transvaginal ultrasound examinations of the pelvis were performed. Transabdominal technique was performed for global imaging of the pelvis including uterus, ovaries, adnexal regions, and pelvic cul-de-sac. It was necessary to proceed with endovaginal exam following the  transabdominal exam to visualize the endometrium and adnexa. Color and duplex Doppler ultrasound was utilized to evaluate blood flow to the ovaries. COMPARISON:  Ultrasound pelvis 01/21/2011 CT abdomen pelvis 12/24/2018 FINDINGS: Uterus Measurements: 6.3 x 3.1 x 6.6 cm = volume: 109 mL. No fibroids or other mass visualized. Endometrium Thickness: 7 mm.  No focal abnormality visualized. Right ovary Measurements: 2.5 x 2.1 x 2.4 cm = volume: 6.4 mL. Normal appearance. Left ovary Measurements: 4.4 x 3.0 x 3.1 cm = volume: 21.3 mL. 2.3 cm dominant follicle is seen. Normal appearance. Pulsed Doppler evaluation of both ovaries demonstrates normal low-resistance arterial and venous waveforms. Other findings No abnormal free fluid. IMPRESSION: No significant sonographic abnormality of the uterus or ovaries. Electronically Signed   By: Acquanetta Belling M.D.   On: 06/13/2023 11:57    Procedures Procedures    Medications Ordered in ED Medications  acetaminophen (TYLENOL) tablet 1,000 mg (1,000 mg Oral Given 06/13/23 1046)  ibuprofen (ADVIL) tablet 600 mg (600 mg Oral Given 06/13/23 1046)    ED Course/ Medical Decision Making/ A&P Clinical Course as of 06/13/23 1245  Fri Jun 13, 2023  1025 Patient did have adnexal tenderness on pelvic exam, L >R. Will have pelvic US performed. [VK]  1208 Ultrasound without acute abnormality. Unclear etiology of patients symptoms. Will be recommended GYN and primary care follow up. [VK]    Clinical Course User Index [VK] Rexford Maus, DO                                 Medical Decision Making This patient presents to the ED with chief complaint(s) of vaginal discharge, foot pain with no pertinent past medical history which further complicates the presenting complaint. The complaint involves an extensive differential diagnosis and also carries with it a high risk of complications and morbidity.    The differential diagnosis includes pregnancy, UTI, STI, BV, yeast, PID,  TOA less likely as she is not septic or ill-appearing, torsion less likely due to chronicity and mild nature of her pain, considering musculoskeletal pain of her feet, plantar fasciitis, no point bony tenderness making fracture dislocation unlikely, no evidence of neurovascular injury, neuropathy  Additional history obtained: Additional history obtained from N/A Records reviewed Primary Care Documents  ED Course and Reassessment: On patient's arrival she is hemodynamically stable in no acute distress.  Will need pelvic exam and is agreeable for STI testing.  Will also have urine and pregnancy.  Suspect foot pain is likely musculoskeletal, possible plantar fasciitis but will have Accu-Chek to evaluate for possible neuropathy.  Given Tylenol and Motrin for pain and will be closely reassessed.  Independent labs interpretation:  The following labs were independently interpreted: within normal range  Independent visualization of imaging: - I independently visualized the following imaging with scope of interpretation limited to determining acute life threatening conditions related to emergency care: pelvic US, which revealed no acute disease  Consultation: - Consulted or discussed management/test interpretation w/ external professional: N/A  Consideration for admission or further workup: Patient has no emergent conditions requiring admission or further work-up at this time and is stable for discharge home with primary care and gyn follow-up  Social Determinants of health: N/A    Amount and/or Complexity of Data Reviewed Labs: ordered. Radiology: ordered.  Risk OTC drugs.          Final Clinical Impression(s) / ED Diagnoses Final diagnoses:  Pelvic pain  Bilateral foot pain    Rx / DC Orders ED Discharge Orders     None         Rexford Maus, DO 06/13/23 1245

## 2023-06-14 LAB — RPR: RPR Ser Ql: NONREACTIVE

## 2023-06-16 LAB — GC/CHLAMYDIA PROBE AMP (~~LOC~~) NOT AT ARMC
Chlamydia: NEGATIVE
Comment: NEGATIVE
Comment: NORMAL
Neisseria Gonorrhea: NEGATIVE

## 2023-06-18 ENCOUNTER — Telehealth (HOSPITAL_COMMUNITY): Payer: Self-pay

## 2023-06-18 NOTE — Telephone Encounter (Signed)
 Via interpreter # [phone], returned pt.s call and 2 identifiers used to verify pt.s ID.  Pt. Requested her STI results from her ED visit on 06/13/2023.  Her STI results were all negative,  Pt. Did not have any further questions.

## 2023-07-02 ENCOUNTER — Encounter: Payer: Self-pay | Admitting: Nurse Practitioner

## 2023-07-02 ENCOUNTER — Ambulatory Visit: Payer: Medicaid Other | Attending: Nurse Practitioner | Admitting: Nurse Practitioner

## 2023-07-02 VITALS — BP 102/66 | HR 77 | Resp 19 | Ht <= 58 in | Wt 139.0 lb

## 2023-07-02 DIAGNOSIS — G8929 Other chronic pain: Secondary | ICD-10-CM

## 2023-07-02 DIAGNOSIS — R7989 Other specified abnormal findings of blood chemistry: Secondary | ICD-10-CM | POA: Diagnosis not present

## 2023-07-02 DIAGNOSIS — M79672 Pain in left foot: Secondary | ICD-10-CM | POA: Diagnosis not present

## 2023-07-02 DIAGNOSIS — R7309 Other abnormal glucose: Secondary | ICD-10-CM | POA: Diagnosis not present

## 2023-07-02 MED ORDER — NAPROXEN 500 MG PO TABS: 500.0000 mg | ORAL_TABLET | Freq: Two times a day (BID) | ORAL | 1 refills | Status: AC

## 2023-07-02 NOTE — Progress Notes (Signed)
 I have seen and examined this patient with the advanced practice provider STUDENT and agree with the note below
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
## 2023-07-03 ENCOUNTER — Encounter: Payer: Self-pay | Admitting: Nurse Practitioner

## 2023-07-03 LAB — HEMOGLOBIN A1C
Est. average glucose Bld gHb Est-mCnc: 120 mg/dL
Hgb A1c MFr Bld: 5.8 % — ABNORMAL HIGH (ref 4.8–5.6)

## 2023-07-03 LAB — CBC
Hematocrit: 38.8 % (ref 34.0–46.6)
Hemoglobin: 13 g/dL (ref 11.1–15.9)
MCH: 30.2 pg (ref 26.6–33.0)
MCHC: 33.5 g/dL (ref 31.5–35.7)
MCV: 90 fL (ref 79–97)
Platelets: 344 10*3/uL (ref 150–450)
RBC: 4.31 x10E6/uL (ref 3.77–5.28)
RDW: 12.9 % (ref 11.7–15.4)
WBC: 7.3 10*3/uL (ref 3.4–10.8)

## 2023-10-21 ENCOUNTER — Ambulatory Visit: Payer: Self-pay
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
## 2023-10-21 NOTE — Telephone Encounter (Signed)
 Noted

## 2023-11-12 ENCOUNTER — Ambulatory Visit: Payer: Self-pay | Attending: Nurse Practitioner | Admitting: Nurse Practitioner

## 2023-11-12 ENCOUNTER — Encounter: Payer: Self-pay | Admitting: Nurse Practitioner

## 2023-11-12 VITALS — BP 109/73 | HR 91 | Resp 19 | Ht <= 58 in | Wt 140.2 lb

## 2023-11-12 DIAGNOSIS — M950 Acquired deformity of nose: Secondary | ICD-10-CM | POA: Diagnosis not present

## 2023-11-12 DIAGNOSIS — Z1231 Encounter for screening mammogram for malignant neoplasm of breast: Secondary | ICD-10-CM

## 2023-11-12 DIAGNOSIS — Z23 Encounter for immunization: Secondary | ICD-10-CM | POA: Diagnosis not present
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
## 2023-11-12 NOTE — Progress Notes (Unsigned)
 OTC;tylenol  and or Ibprofen

## 2023-11-14 ENCOUNTER — Other Ambulatory Visit: Payer: Self-pay | Admitting: Nurse Practitioner

## 2023-11-14 ENCOUNTER — Encounter: Payer: Self-pay | Admitting: Nurse Practitioner

## 2023-11-14 DIAGNOSIS — M950 Acquired deformity of nose: Secondary | ICD-10-CM

## 2023-11-21 ENCOUNTER — Ambulatory Visit
Admission: RE | Admit: 2023-11-21 | Discharge: 2023-11-21 | Disposition: A | Discharge status: Home / Self Care | Source: Ambulatory Visit | Attending: Nurse Practitioner

## 2023-11-21 DIAGNOSIS — Z1231 Encounter for screening mammogram for malignant neoplasm of breast: Secondary | ICD-10-CM

## 2023-11-26 ENCOUNTER — Ambulatory Visit: Payer: Self-pay | Admitting: Nurse Practitioner

## 2023-12-18 ENCOUNTER — Emergency Department (HOSPITAL_COMMUNITY)

## 2023-12-18 ENCOUNTER — Emergency Department (HOSPITAL_COMMUNITY): Admission: EM | Admit: 2023-12-18 | Discharge: 2023-12-18 | Disposition: A | Discharge status: Home / Self Care

## 2023-12-18 ENCOUNTER — Other Ambulatory Visit: Payer: Self-pay

## 2023-12-18 ENCOUNTER — Encounter (HOSPITAL_COMMUNITY): Payer: Self-pay

## 2023-12-18 DIAGNOSIS — K2900 Acute gastritis without bleeding: Secondary | ICD-10-CM | POA: Diagnosis not present

## 2023-12-18 DIAGNOSIS — R1013 Epigastric pain: Secondary | ICD-10-CM | POA: Diagnosis present

## 2023-12-18 LAB — COMPREHENSIVE METABOLIC PANEL WITH GFR
ALT: 19 U/L (ref 0–44)
AST: 16 U/L (ref 15–41)
Albumin: 4.4 g/dL (ref 3.5–5.0)
Alkaline Phosphatase: 68 U/L (ref 38–126)
Anion gap: 10 (ref 5–15)
BUN: 7 mg/dL (ref 6–20)
CO2: 24 mmol/L (ref 22–32)
Calcium: 9 mg/dL (ref 8.9–10.3)
Chloride: 105 mmol/L (ref 98–111)
Creatinine, Ser: 0.66 mg/dL (ref 0.44–1.00)
GFR, Estimated: >60 mL/min (ref >60)
Glucose, Bld: 86 mg/dL (ref 70–99)
Potassium: 4.1 mmol/L (ref 3.5–5.1)
Sodium: 139 mmol/L (ref 135–145)
Total Bilirubin: 0.3 mg/dL (ref 0.0–1.2)
Total Protein: 7.4 g/dL (ref 6.5–8.1)

## 2023-12-18 LAB — CBC WITH DIFFERENTIAL/PLATELET
Abs Immature Granulocytes: 0.02 K/uL (ref 0.00–0.07)
Basophils Absolute: 0.1 K/uL (ref 0.0–0.1)
Basophils Relative: 1 %
Eosinophils Absolute: 0.1 K/uL (ref 0.0–0.5)
Eosinophils Relative: 1 %
HCT: 38.6 % (ref 36.0–46.0)
Hemoglobin: 12.2 g/dL (ref 12.0–15.0)
Immature Granulocytes: 0 %
Lymphocytes Relative: 30 %
Lymphs Abs: 3 K/uL (ref 0.7–4.0)
MCH: 28.5 pg (ref 26.0–34.0)
MCHC: 31.6 g/dL (ref 30.0–36.0)
MCV: 90.2 fL (ref 80.0–100.0)
Monocytes Absolute: 0.6 K/uL (ref 0.1–1.0)
Monocytes Relative: 6 %
Neutro Abs: 6.3 K/uL (ref 1.7–7.7)
Neutrophils Relative %: 62 %
Platelets: 289 K/uL (ref 150–400)
RBC: 4.28 MIL/uL (ref 3.87–5.11)
RDW: 13.8 % (ref 11.5–15.5)
WBC: 10.1 K/uL (ref 4.0–10.5)
nRBC: 0 % (ref 0.0–0.2)

## 2023-12-18 LAB — HCG, SERUM, QUALITATIVE: Preg, Serum: NEGATIVE

## 2023-12-18 LAB — URINALYSIS, ROUTINE W REFLEX MICROSCOPIC
Bilirubin Urine: NEGATIVE
Glucose, UA: NEGATIVE mg/dL
Hgb urine dipstick: NEGATIVE
Ketones, ur: NEGATIVE mg/dL
Leukocytes,Ua: NEGATIVE
Nitrite: NEGATIVE
Protein, ur: NEGATIVE mg/dL
Specific Gravity, Urine: 1.01 (ref 1.005–1.030)
pH: 7 (ref 5.0–8.0)

## 2023-12-18 LAB — LIPASE, BLOOD: Lipase: 28 U/L (ref 11–51)

## 2023-12-18 MED ORDER — ONDANSETRON 4 MG PO TBDP
4.0000 mg | ORAL_TABLET | Freq: Once | ORAL | Status: AC
  Administered 2023-12-18: 4 mg via ORAL
  Filled 2023-12-18: qty 1

## 2023-12-18 MED ORDER — SUCRALFATE 1 G PO TABS: 1.0000 g | ORAL_TABLET | Freq: Three times a day (TID) | ORAL | 0 refills | Status: AC

## 2023-12-18 MED ORDER — ALUM & MAG HYDROXIDE-SIMETH 200-200-20 MG/5ML PO SUSP
30.0000 mL | Freq: Once | ORAL | Status: AC
  Administered 2023-12-18: 30 mL via ORAL
  Filled 2023-12-18: qty 30

## 2023-12-18 MED ORDER — PANTOPRAZOLE SODIUM 20 MG PO TBEC: 20.0000 mg | DELAYED_RELEASE_TABLET | Freq: Every day | ORAL | 0 refills | Status: AC

## 2023-12-18 MED ORDER — IOHEXOL 300 MG/ML  SOLN
100.0000 mL | Freq: Once | INTRAMUSCULAR | Status: AC | PRN
  Administered 2023-12-18: 100 mL via INTRAVENOUS

## 2023-12-18 MED ORDER — SUCRALFATE 1 G PO TABS
1.0000 g | ORAL_TABLET | Freq: Once | ORAL | Status: AC
  Administered 2023-12-18: 1 g via ORAL
  Filled 2023-12-18: qty 1

## 2023-12-18 MED ORDER — ONDANSETRON 4 MG PO TBDP: 4.0000 mg | ORAL_TABLET | Freq: Three times a day (TID) | ORAL | 0 refills | Status: AC | PRN

## 2023-12-18 NOTE — ED Triage Notes (Signed)
 Pt reports with upper abdominal pain that goes into her chest and back x 10 days.

## 2023-12-18 NOTE — Discharge Instructions (Signed)
 Please follow-up with your primary doctor.  Return if you have fevers, chills, severe pain, uncontrolled nausea or vomiting.  Vomiting blood or have blood in your stool.  Or if you develop any new or worsening symptoms that are concerning to you.

## 2023-12-18 NOTE — ED Provider Notes (Signed)
 [place] EMERGENCY DEPARTMENT AT [facility] Provider Note   CSN: [id number] Arrival date & time: 12/18/23  9065     Patient presents with: Abdominal Pain   [name] Hale is a 43 y.o. female.   Is a 43 year old female presenting emergency department for epigastric abdominal pain that radiates straight through to her back.  Reports symptoms have been ongoing for the past 7 to 8 days and have been nearly constant.  Worsened with food.  Had some nausea vomiting on onset, but has not had any further episodes of nausea vomiting.  Normal bowel movements disappearing.  No blood in vomit or stool.  Has prior hysterectomies, but no other surgical history.  Denies chest pain or shortness of breath.   Abdominal Pain      Prior to Admission medications   Medication Sig Start Date End Date Taking? Authorizing Provider  ferrous sulfate  325 (65 FE) MG tablet Take 1 tablet (325 mg total) by mouth daily with breakfast. Patient not taking: Reported on 11/12/2023 04/03/23   Newlin, Enobong, MD  naproxen  (NAPROSYN ) 500 MG tablet Take 1 tablet (500 mg total) by mouth 2 (two) times daily with a meal. Patient not taking: Reported on 11/12/2023 07/02/23   Fleming, Zelda W, NP    Allergies: Ciprofloxacin, Estrogens, Metronidazole, and Porcine (pork) protein-containing drug products    Review of Systems  Gastrointestinal:  Positive for abdominal pain.    Updated Vital Signs BP 106/64 (BP Location: Left Arm)   Pulse 88   Temp 98.2 F (36.8 C) (Oral)   Resp 18   SpO2 100%   Physical Exam Vitals and nursing note reviewed.  Constitutional:      General: She is not in acute distress.    Appearance: She is not toxic-appearing.  HENT:     Head: Normocephalic.  Cardiovascular:     Rate and Rhythm: Normal rate and regular rhythm.  Pulmonary:     Effort: Pulmonary effort is normal.     Breath sounds: Normal breath sounds.  Abdominal:     General: Abdomen is flat.     Palpations:  Abdomen is soft.     Tenderness: There is no abdominal tenderness.  Skin:    General: Skin is warm and dry.     Capillary Refill: Capillary refill takes less than 2 seconds.  Neurological:     General: No focal deficit present.     Mental Status: She is alert.  Psychiatric:        Mood and Affect: Mood normal.        Behavior: Behavior normal.     (all labs ordered are listed, but only abnormal results are displayed) Labs Reviewed  URINALYSIS, ROUTINE W REFLEX MICROSCOPIC - Abnormal; Notable for the following components:      Result Value   Color, Urine STRAW (*)    All other components within normal limits  COMPREHENSIVE METABOLIC PANEL WITH GFR  LIPASE, BLOOD  CBC WITH DIFFERENTIAL/PLATELET  HCG, SERUM, QUALITATIVE    EKG: None  Radiology: CT ABDOMEN PELVIS W CONTRAST Result Date: 12/18/2023 CLINICAL DATA:  Acute abdominal pain. EXAM: CT ABDOMEN AND PELVIS WITH CONTRAST TECHNIQUE: Multidetector CT imaging of the abdomen and pelvis was performed using the standard protocol following bolus administration of intravenous contrast. RADIATION DOSE REDUCTION: This exam was performed according to the departmental dose-optimization program which includes automated exposure control, adjustment of the mA and/or kV according to patient size and/or use of iterative reconstruction technique. CONTRAST:  100mL OMNIPAQUE   IOHEXOL  300 MG/ML  SOLN COMPARISON:  CT 12/24/2018, pelvic ultrasound 06/13/2023 FINDINGS: Lower chest: No acute findings. Punctate nodule in the right lower lobe is unchanged from 2020 exam and considered definitively benign, series 14, image 5. Hepatobiliary: Scattered tiny hepatic hypodensities are too small to characterize but likely cysts, not significantly changed from prior. Focal fatty infiltration adjacent to the falciform ligament. Clips in the gallbladder fossa postcholecystectomy. No biliary dilatation. Pancreas: No ductal dilatation or inflammation. Spleen: Normal in  size without focal abnormality. Adrenals/Urinary Tract: The adrenal glands are normal. No hydronephrosis. No evidence of perinephric inflammation. No renal stone or suspicious renal lesion. Unremarkable urinary bladder Stomach/Bowel: Fluid within the stomach. Fluid-filled small bowel without abnormal distension. No bowel wall thickening. Mild mesenteric engorgement. The appendix is normal. Moderate stool within the right colon, small volume of stool within the left colon. No colonic inflammation. Obstruction. Vascular/Lymphatic: Normal caliber abdominal aorta. Patent portal vein. No acute vascular findings. No portal venous or mesenteric gas. No abdominopelvic adenopathy. Reproductive: Physiologic corpus luteal cyst in the right ovary. Tubal ligation clips, the left-sided clip is in the region of the uterine cornua. The right-sided clip is not opposed to the uterus and is in the dependent pelvis. Other: Trace free fluid in the pelvis. No ascites. Tiny fat containing umbilical hernia. Musculoskeletal: There are no acute or suspicious osseous abnormalities. IMPRESSION: 1. Fluid-filled small bowel with mild mesenteric engorgement, can be seen with enteritis. No bowel obstruction. 2. Tubal ligation clips, the right-sided clip is not opposed to the uterus and is in the dependent pelvis. Electronically Signed   By: Andrea Gasman M.D.   On: 12/18/2023 12:05     Procedures   Medications Ordered in the ED  alum & mag hydroxide-simeth (MAALOX/MYLANTA) 200-200-20 MG/5ML suspension 30 mL (30 mLs Oral Given 12/18/23 1059)  sucralfate (CARAFATE) tablet 1 g (1 g Oral Given 12/18/23 1058)  ondansetron  (ZOFRAN -ODT) disintegrating tablet 4 mg (4 mg Oral Given 12/18/23 1059)  iohexol  (OMNIPAQUE ) 300 MG/ML solution 100 mL (100 mLs Intravenous Contrast Given 12/18/23 1125)    Clinical Course as of 12/18/23 1227  Thu Dec 18, 2023  1033 CT abd in 2020 per my chart review: IMPRESSION: Status post cholecystectomy. Expected  postsurgical changes seen in periumbilical region of anterior abdominal wall. No other significant abnormality seen in the abdomen or pelvis [TY]  1209 CT ABDOMEN PELVIS W CONTRAST IMPRESSION: 1. Fluid-filled small bowel with mild mesenteric engorgement, can be seen with enteritis. No bowel obstruction. 2. Tubal ligation clips, the right-sided clip is not opposed to the uterus and is in the dependent pelvis.   Electronically Signed   By: Andrea Gasman M.D.   On: 12/18/2023 12:05   [TY]  1224 Is feeling much improved after medications.  History provided by patient was consistent with gastritis/ulcer.  She has no leukocytosis to suggest systemic infection.  No anemia.  Comprehensive panel without metabolic derangements.  No transaminitis to suggest hepatobiliary disease.  Lipase is normal.  Pancreatitis unlikely.  Pregnancy test negative, ectopic pregnancy unlikely.  UA negative for infection and no hematuria to suggest stone.  Her CT abdomen today with fluid-filled bowel and mesenteric engorgement with possible enteritis, however does not fit clinical picture.  Has a benign abdominal exam.  Discussed symptomatic treatment with medications and outpatient follow-up.  Patient agreeable to plan.  Will discharge in stable condition. [TY]    Clinical Course User Index [TY] Neysa Caron PARAS, DO  Medical Decision Making This is a 42 year old female with past medical history significant for prior C-section and cholecystectomy.  She is afebrile nontachycardic, normotensive.  Benign abdominal exam.  I suspect gastritis/ulcer, will treat with GI cocktail and sucralfate.  Will get screening labs and CT scan however to evaluate for other occult causes of her symptoms today.  See ED course for further MDM and final disposition.  Amount and/or Complexity of Data Reviewed External Data Reviewed:     Details: See ED course Labs: ordered. Decision-making details  documented in ED Course. Radiology: ordered and independent interpretation performed. Decision-making details documented in ED Course.    Details: Do not appreciate free air on CT scan ECG/medicine tests: ordered and independent interpretation performed.    Details: No ischemic changes  Risk OTC drugs. Prescription drug management. Decision regarding hospitalization. Diagnosis or treatment significantly limited by social determinants of health. Risk Details: Primarily Spanish-speaking.  History obtained with aid of interpreter.       Final diagnoses:  None    ED Discharge Orders     None          Neysa Caron PARAS, DO 12/18/23 1227

## 2024-04-08 ENCOUNTER — Ambulatory Visit: Payer: Self-pay

## 2024-04-08 NOTE — Telephone Encounter (Signed)
 FYI Only or Action Required?: FYI only for provider: appointment scheduled on 2/5.  Patient was last seen in primary care on 11/12/2023 by [name] Hale ORN, NP.  Called Nurse Triage reporting Hair/Scalp Problem.  Symptoms began several months ago.  Interventions attempted: Nothing.  Symptoms are: gradually worsening.  Triage Disposition: See PCP Within 2 Weeks  Patient/caregiver understands and will follow disposition?: Yes  Reason for Triage: hair falling out , is getting worse   Reason for Disposition  [1] Hair thinning, hair loss, or balding AND [2] cause not known (e.g., no recent precipitating factors such as childbirth, weight loss surgery)  Answer Assessment - Initial Assessment Questions 1. LOCATION: Where is the hair loss? (e.g., all of scalp, parts of scalp, back of head or neck)     *No Answer* 2. DESCRIPTION: What does it look like? (e.g., thinning of hair, balding, patches of hair missing)     *No Answer* 3. ONSET: When did the hair loss begin? (e.g., sudden or gradual onset; days, weeks, months or years ago)     2 months ago, worsening over 2 weeks 4. OTHER SYMPTOMS: What does the scalp look like where the hair is missing? (e.g., normal, redness, crusts, scarring)     Some itching at times 5. OTHER FACTORS: Have you had any of the following recently: childbirth, severe illness or injury, major surgery, major weight loss, cancer chemo, tight hair braids, serious stress?     *No Answer*  Protocols used: Hair Loss-A-AH

## 2024-04-08 NOTE — Telephone Encounter (Signed)
 Noted, appointment scheduled.

## 2024-04-14 ENCOUNTER — Telehealth: Payer: Self-pay | Admitting: *Deleted

## 2024-04-14 NOTE — Telephone Encounter (Signed)
 04/14/24 Confirmed appointment with patient

## 2024-04-15 ENCOUNTER — Ambulatory Visit: Payer: Self-pay | Admitting: *Deleted

## 2024-04-15 ENCOUNTER — Encounter: Payer: Self-pay | Admitting: *Deleted

## 2024-04-15 VITALS — BP 124/74 | HR 81 | Temp 98.1°F | Ht <= 58 in | Wt 133.6 lb

## 2024-04-15 DIAGNOSIS — F3289 Other specified depressive episodes: Secondary | ICD-10-CM

## 2024-04-15 DIAGNOSIS — L659 Nonscarring hair loss, unspecified: Secondary | ICD-10-CM

## 2024-04-15 LAB — COMPREHENSIVE METABOLIC PANEL WITH GFR
ALT: 33 [IU]/L — ABNORMAL HIGH (ref 0–32)
AST: 21 [IU]/L (ref 0–40)
Albumin: 4.9 g/dL (ref 3.9–4.9)
Alkaline Phosphatase: 90 [IU]/L (ref 41–116)
BUN/Creatinine Ratio: 12 (ref 9–23)
BUN: 8 mg/dL (ref 6–24)
Bilirubin Total: 0.2 mg/dL (ref 0.0–1.2)
CO2: 22 mmol/L (ref 20–29)
Calcium: 9.9 mg/dL (ref 8.7–10.2)
Chloride: 103 mmol/L (ref 96–106)
Creatinine, Ser: 0.68 mg/dL (ref 0.57–1.00)
Globulin, Total: 2.9 g/dL (ref 1.5–4.5)
Glucose: 85 mg/dL (ref 70–99)
Potassium: 4.1 mmol/L (ref 3.5–5.2)
Sodium: 142 mmol/L (ref 134–144)
Total Protein: 7.8 g/dL (ref 6.0–8.5)
eGFR: 112 mL/min/{1.73_m2}

## 2024-04-15 MED ORDER — MIRTAZAPINE 15 MG PO TABS: 15.0000 mg | ORAL_TABLET | Freq: Every day | ORAL | 0 refills | Status: AC
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
## 2024-04-15 NOTE — Patient Instructions (Signed)
 Today at intake your blood pressure was 124/74 with a pulse of 81.  (This is in normal range)  We also discussed your hair loss over the last 2 weeks associated with stress, loss of appetite and poor sleep quality. You have tried biotin and vitamin E with no improvement. Will get baseline lab work and plan accordingly  You reported feeling depressed and anxious and are agreeable to start a medication that should help you sleep better and increase your appetite. Will also refer you to a therapist. We did discuss thoughts of suicide without a plan.  if the symptoms get worse please call the suicide hotline at 41 or call 911  You also did report a symptom of headache and stomachache after getting extremely angry. I think this is a physical reaction associated with your depression and anxiety.

## 2024-04-16 ENCOUNTER — Ambulatory Visit: Payer: Self-pay | Admitting: *Deleted

## 2024-04-16 LAB — CBC WITH DIFFERENTIAL/PLATELET
Basophils Absolute: 0.1 10*3/uL (ref 0.0–0.2)
Basos: 1 %
EOS (ABSOLUTE): 0.1 10*3/uL (ref 0.0–0.4)
Eos: 1 %
Hematocrit: 40 % (ref 34.0–46.6)
Hemoglobin: 12.8 g/dL (ref 11.1–15.9)
Immature Grans (Abs): 0 10*3/uL (ref 0.0–0.1)
Immature Granulocytes: 0 %
Lymphocytes Absolute: 2.7 10*3/uL (ref 0.7–3.1)
Lymphs: 33 %
MCH: 28.7 pg (ref 26.6–33.0)
MCHC: 32 g/dL (ref 31.5–35.7)
MCV: 90 fL (ref 79–97)
Monocytes Absolute: 0.5 10*3/uL (ref 0.1–0.9)
Monocytes: 6 %
Neutrophils Absolute: 4.8 10*3/uL (ref 1.4–7.0)
Neutrophils: 59 %
Platelets: 303 10*3/uL (ref 150–450)
RBC: 4.46 x10E6/uL (ref 3.77–5.28)
RDW: 13.5 % (ref 11.7–15.4)
WBC: 8.2 10*3/uL (ref 3.4–10.8)

## 2024-04-16 LAB — TSH: TSH: 3.04 u[IU]/mL (ref 0.450–4.500)

## 2024-05-13 ENCOUNTER — Ambulatory Visit: Payer: Self-pay | Admitting: *Deleted
